# Patient Record
Sex: Female | Born: 1970
Health system: Southern US, Community
[De-identification: ages and names within clinical notes are randomized; demographics above are authoritative.]

## PROBLEM LIST (undated history)

## (undated) DIAGNOSIS — F419 Anxiety disorder, unspecified: Secondary | ICD-10-CM

## (undated) DIAGNOSIS — M069 Rheumatoid arthritis, unspecified: Secondary | ICD-10-CM

## (undated) DIAGNOSIS — G4730 Sleep apnea, unspecified: Secondary | ICD-10-CM

## (undated) DIAGNOSIS — J45909 Unspecified asthma, uncomplicated: Secondary | ICD-10-CM

## (undated) DIAGNOSIS — E039 Hypothyroidism, unspecified: Secondary | ICD-10-CM

## (undated) DIAGNOSIS — K219 Gastro-esophageal reflux disease without esophagitis: Secondary | ICD-10-CM

## (undated) HISTORY — PX: OTHER SURGICAL HISTORY: SHX169

## (undated) HISTORY — DX: Rheumatoid arthritis, unspecified: M06.9

## (undated) HISTORY — PX: ABDOMINAL HYSTERECTOMY: SHX81

---

## 2000-10-23 ENCOUNTER — Other Ambulatory Visit: Admission: RE | Admit: 2000-10-23 | Discharge: 2000-10-23 | Payer: Self-pay | Admitting: Obstetrics and Gynecology

## 2001-12-22 ENCOUNTER — Other Ambulatory Visit: Admission: RE | Admit: 2001-12-22 | Discharge: 2001-12-22 | Payer: Self-pay | Admitting: Obstetrics and Gynecology

## 2001-12-31 ENCOUNTER — Encounter (INDEPENDENT_AMBULATORY_CARE_PROVIDER_SITE_OTHER): Payer: Self-pay

## 2001-12-31 ENCOUNTER — Ambulatory Visit (HOSPITAL_COMMUNITY): Admission: RE | Admit: 2001-12-31 | Discharge: 2001-12-31 | Payer: Self-pay | Admitting: Obstetrics and Gynecology

## 2002-09-23 ENCOUNTER — Ambulatory Visit (HOSPITAL_COMMUNITY): Admission: RE | Admit: 2002-09-23 | Discharge: 2002-09-23 | Payer: Self-pay | Admitting: Gastroenterology

## 2003-08-11 ENCOUNTER — Other Ambulatory Visit: Admission: RE | Admit: 2003-08-11 | Discharge: 2003-08-11 | Payer: Self-pay | Admitting: Obstetrics and Gynecology

## 2003-11-17 ENCOUNTER — Encounter (INDEPENDENT_AMBULATORY_CARE_PROVIDER_SITE_OTHER): Payer: Self-pay | Admitting: *Deleted

## 2003-11-17 ENCOUNTER — Inpatient Hospital Stay (HOSPITAL_COMMUNITY): Admission: RE | Admit: 2003-11-17 | Discharge: 2003-11-19 | Payer: Self-pay | Admitting: Obstetrics and Gynecology

## 2003-11-20 ENCOUNTER — Inpatient Hospital Stay (HOSPITAL_COMMUNITY): Admission: AD | Admit: 2003-11-20 | Discharge: 2003-11-20 | Payer: Self-pay | Admitting: Obstetrics and Gynecology

## 2003-11-27 ENCOUNTER — Inpatient Hospital Stay (HOSPITAL_COMMUNITY): Admission: AD | Admit: 2003-11-27 | Discharge: 2003-11-27 | Payer: Self-pay | Admitting: Obstetrics and Gynecology

## 2004-04-11 ENCOUNTER — Ambulatory Visit: Admission: RE | Admit: 2004-04-11 | Discharge: 2004-04-11 | Payer: Self-pay | Admitting: Gynecology

## 2004-04-18 ENCOUNTER — Ambulatory Visit (HOSPITAL_COMMUNITY): Admission: RE | Admit: 2004-04-18 | Discharge: 2004-04-18 | Payer: Self-pay | Admitting: Obstetrics and Gynecology

## 2004-05-16 ENCOUNTER — Inpatient Hospital Stay (HOSPITAL_COMMUNITY): Admission: RE | Admit: 2004-05-16 | Discharge: 2004-05-18 | Payer: Self-pay | Admitting: Obstetrics and Gynecology

## 2004-05-16 ENCOUNTER — Encounter (INDEPENDENT_AMBULATORY_CARE_PROVIDER_SITE_OTHER): Payer: Self-pay | Admitting: *Deleted

## 2005-01-03 ENCOUNTER — Other Ambulatory Visit: Admission: RE | Admit: 2005-01-03 | Discharge: 2005-01-03 | Payer: Self-pay | Admitting: Obstetrics and Gynecology

## 2005-02-01 ENCOUNTER — Ambulatory Visit: Payer: Self-pay | Admitting: Cardiology

## 2008-08-02 ENCOUNTER — Encounter: Payer: Self-pay | Admitting: Gastroenterology

## 2008-08-06 ENCOUNTER — Ambulatory Visit: Payer: Self-pay | Admitting: Gastroenterology

## 2008-08-06 DIAGNOSIS — K602 Anal fissure, unspecified: Secondary | ICD-10-CM | POA: Insufficient documentation

## 2008-08-06 DIAGNOSIS — L909 Atrophic disorder of skin, unspecified: Secondary | ICD-10-CM | POA: Insufficient documentation

## 2008-08-06 DIAGNOSIS — L919 Hypertrophic disorder of the skin, unspecified: Secondary | ICD-10-CM

## 2008-08-06 DIAGNOSIS — E785 Hyperlipidemia, unspecified: Secondary | ICD-10-CM | POA: Insufficient documentation

## 2008-08-06 DIAGNOSIS — K625 Hemorrhage of anus and rectum: Secondary | ICD-10-CM | POA: Insufficient documentation

## 2008-08-09 ENCOUNTER — Ambulatory Visit: Payer: Self-pay | Admitting: Gastroenterology

## 2008-08-30 ENCOUNTER — Encounter: Payer: Self-pay | Admitting: Gastroenterology

## 2008-12-16 ENCOUNTER — Ambulatory Visit (HOSPITAL_COMMUNITY): Admission: RE | Admit: 2008-12-16 | Discharge: 2008-12-16 | Payer: Self-pay | Admitting: Obstetrics and Gynecology

## 2009-03-24 ENCOUNTER — Ambulatory Visit: Payer: Self-pay | Admitting: Diagnostic Radiology

## 2009-03-24 ENCOUNTER — Emergency Department (HOSPITAL_BASED_OUTPATIENT_CLINIC_OR_DEPARTMENT_OTHER): Admission: EM | Admit: 2009-03-24 | Discharge: 2009-03-24 | Payer: Self-pay | Admitting: Emergency Medicine

## 2010-10-15 HISTORY — PX: OTHER SURGICAL HISTORY: SHX169

## 2011-01-25 LAB — CBC
Hemoglobin: 14.6 g/dL (ref 12.0–15.0)
MCHC: 34.8 g/dL (ref 30.0–36.0)
MCV: 92.1 fL (ref 78.0–100.0)
RBC: 4.54 MIL/uL (ref 3.87–5.11)

## 2011-02-27 NOTE — H&P (Signed)
Jean Hernandez, Jean Hernandez                  ACCOUNT NO.:  [id number]   MEDICAL RECORD NO.:  [id number]          PATIENT TYPE:  AMB   LOCATION:  SDC                           FACILITY:  [facility]   PHYSICIAN:  Guy Sandifer. Henderson Cloud, M.D. DATE OF BIRTH:  1971-09-15   DATE OF ADMISSION:  DATE OF DISCHARGE:                              HISTORY & PHYSICAL   CHIEF COMPLAINT:  Leaking urine.   HISTORY OF PRESENT ILLNESS:  This patient is a 40 year old married white  female status post hysterectomy, status post subsequent, BSO and  exploratory laparotomy for ovarian remnant, complains of leaking urine  with coughing and sneezing.  Urodynamic studies consistent with stress  urinary incontinence.  After discussion of options, she is being  admitted for mid urethral sling.  Potential risks and complications have  been discussed preoperatively.   PAST MEDICAL HISTORY:  1. Depression.  2. Urinary tract infection.  3. Osteoporosis.  4. History of headache.  5. Esophageal reflux.   PAST SURGICAL HISTORY:  1. Cesarean hysterectomy.  2. Laparoscopy with lysis of adhesions and right salpingo-      oophorectomy.  3. Laparotomy with left salpingo-oophorectomy.  4. Laparotomy with extensive lysis of adhesions and removal of left      pelvic ovarian remnant.   OBSTETRICAL HISTORY:  Cesarean section x2.   MEDICATIONS:  Zoloft daily, Protonix daily, vitamin D daily.   ALLERGIES:  AMOXICILLIN leading to a bad rash.  ULTRACET, DARVOCET,  PERCOCET, and VICODIN all leading to mild itching.   SOCIAL HISTORY:  Denies tobacco, alcohol, or drug abuse.   FAMILY HISTORY:  Positive for migraines, heart disease, seizure  disorder, thyroid disease, diverticulosis, arthritis, diabetes,  hypertension, and cancer.   REVIEW OF SYSTEMS:  NEURO:  Headache as above.  CARDIAC:  Denied chest  pain.  PULMONARY:  Mild cough on and off.   PHYSICAL EXAMINATION:  VITAL SIGNS:  Height 5 feet 5-1/2 inches, weight  200.2 pounds, blood  pressure 120/82.  LUNGS:  Clear to auscultation.  HEART:  Regular rate and rhythm.  BACK:  No CVA tenderness.  ABDOMEN:  Soft, nontender without masses.  PELVIC:  Normal vagina without lesion.  Adnexa nontender without  palpable masses.  EXTREMITIES:  Grossly within normal limits.  NEUROLOGICAL:  Grossly within normal limits.   ASSESSMENT:  Stress urinary incontinence.   PLAN:  Mid urethral sling.      Guy Sandifer Henderson Cloud, M.D.  Electronically Signed     JET/MEDQ  D:  12/06/2008  T:  12/07/2008  Job:  [id number]

## 2011-02-27 NOTE — Op Note (Signed)
NAMEJOLA, CRITZER                  ACCOUNT NO.:  [id number]   MEDICAL RECORD NO.:  [id number]          PATIENT TYPE:  AMB   LOCATION:  SDC                           FACILITY:  [facility]   PHYSICIAN:  Guy Sandifer. Henderson Cloud, M.D. DATE OF BIRTH:  [birth year]   DATE OF PROCEDURE:  12/16/2008  DATE OF DISCHARGE:                               OPERATIVE REPORT   PREOPERATIVE DIAGNOSIS:  Stress urinary incontinence.   POSTOPERATIVE DIAGNOSIS:  Stress urinary incontinence.   PROCEDURE:  Solyx single incision mid-urethral sling.   SURGEON:  Guy Sandifer. Henderson Cloud, MD   ANESTHESIA:  General with LMA.   ESTIMATED BLOOD LOSS:  150 mL.   INDICATIONS AND CONSENT:  This patient is a 40 year old married white  female status post hysterectomy, status post BSO with symptomatic stress  incontinence.  Details are dictated in the history and physical.  Options were reviewed and mid-urethral sling was discussed  preoperatively.  Potential risks and complications were reviewed  preoperatively including but limited to infection, organ damage,  bleeding requiring transfusion of blood products with HIV and hepatitis  acquisition, DVT, PE, pneumonia.  Delayed healing, erosion, fistula  formation, pelvic pain, dyspareunia, recurrent stress incontinence,  prolonged catheterization, self-catheterization return to the OR,  irritative voiding symptoms and recurrent stress incontinence were  reviewed preoperatively.  All questions were answered and consent was  signed on the chart.   PROCEDURE:  The patient was taken to operating room where she was  identified, placed in dorsal supine position, and general anesthesia was  induced via LMA.  She was then placed in dorsal lithotomy position where  she was prepped and draped in a sterile fashion.  Foley catheter was  placed to the bladder as a drain.  The infraumbilical vaginal mucosa was  injected with 1% Xylocaine with 1:200,000 epinephrine.  A small  suburethral midline  incision was made.  Dissection was carried out  bilaterally to the level of the urogenital diaphragm.  The polypropylene  mesh, Solyx sling was then placed first on the right-hand side and then  the on the left-hand side.  Before releasing the applicator on the left-  hand side, a Tresa Endo was placed below the sling and can be rotated  perpendicular to the floor without excessive tension.  The sling was  laying flat with no kinks or rolls.  The applicator was then released  from the left side as well.  There was a bleeding noted at this point.  A 2-0 Monocryl suture was used with a single stitch on either side that  was placed on the backside of the vaginal mucosa about 1-cm lateral to  the midline incision bilaterally.  This does not incorporate the sling.  This controlled the bleeding quite well.  The midline incision was then  closed in a running locking fashion with the 2-0 Monocryl suture.  Foley  catheter was then removed and  cystoscopy was carried out with 70 degrees scope.  A 360-degree  inspection reveals no evidence of foreign bodies, no perforation, and  good puff of urine from the ureters bilaterally.  Foley catheter was  replaced, left in place to straight drain.  All counts were correct.  The patient was awakened and taken to recovery room in stable condition.       Guy Sandifer Henderson Cloud, M.D.  Electronically Signed     JET/MEDQ  D:  12/16/2008  T:  12/16/2008  Job:  [id number]

## 2011-03-02 NOTE — Op Note (Signed)
[facility]  Patient:    Jean Hernandez, Jean Hernandez Visit Number: [id number] MRN: [id number]          Service Type: DSU Location: [facility] Attending Physician:  Soledad Gerlach Dictated by:   Guy Sandifer [name], M.D. Proc. Date: 12/31/01 Admit Date:  12/31/2001                             Operative Report  PREOPERATIVE DIAGNOSIS:       Pelvic cyst.  POSTOPERATIVE DIAGNOSIS:      Right ovarian cyst and extensive pelvic                               adhesions.  OPERATION:                    Laparoscopy with right salpingo-oophorectomy and                               extensive lysis of adhesions.  SURGEON:                      Guy Sandifer. [name], M.D.  ANESTHESIA:                   General with endotracheal intubation.  ESTIMATED BLOOD LOSS:         100 cc.  INDICATIONS AND CONSENT:      This patient is a 40 year old married white female, status post cesarean hysterectomy with pelvic pain and pelvic cyst. Details are dictated in History and Physical.  Laparoscopy with removal of cyst, possible unilateral salpingo-oophorectomy, and possible laparotomy is discussed.  The potential risks and complications have been discussed including, but not limited to infection, bowel, bladder, or ureteral damage, bleeding requiring transfusion of blood products with possible transfusion reaction, HIV and hepatitis acquisition, DVT, PE, and pneumonia.  All questions have been answered and consent is signed on the chart.  FINDINGS:                     There is a dense row of omental adhesions down the midline to the level of the bladder.  The right ovary contains a 6-8 cm cyst which is adherent to the vaginal cuff as well as the sigmoid.  The left ovary is also essentially surrounded by adhesions.  DESCRIPTION OF PROCEDURE:     The patient is taken to the operating room and placed in the dorsal supine position where general anesthesia was induced via endotracheal  intubation.  She was then placed in the dorsolithotomy position where she was prepped abdominally and vaginally.  The bladder straight catheterized.  Sponge on the stick is placed and the vagina is manipulated, and she is draped in a sterile fashion.  A small infraumbilical incision was made and the 10-11 disposable trocar sleeve was placed.  Placement was verified with the laparoscope and the damage to the surrounding structures is noted.  Pneumoperitoneum is induced.  The adhesions of the omentum to the anterior abdominal wall are taken down stepwise with bipolar cautery and scissors.  This then exposes the large right ovarian cyst.  Then, in a very careful fashion using primarily sharp and some blunt dissection, the ovary is freed from its adhesions to the bowel.  While doing this, the  cyst drained a hemorrhagic fluid.  No excrescences were noted.  The infundibulopelvic ligament is totally free.  The course of the ureter could be seen well clear the area of surgery.  Then, using bipolar cautery, the infundibulopelvic ligament followed by the meso was taken down in a stepwise fashion.  This is carried out to the point of adherence to the vaginal cuff.  This is taken down sharply.  The left ovary is essentially abutting and adherent to a portion of the right ovary.  This was all taken down.  The ovary is finally released from its attachments.  Bipolar cautery was used to obtain complete hemostasis. Very careful attention was paid to the bowel which seems to be well-clear of the areas of surgery.  Copious irrigation is carried out.  It should be noted that after the omental adhesions were taken down, suprapubic, and _______ left lower quadrant incisions were made after careful transillumination and 5 mm non-disposable trocar sleeves were placed under direct visualization without difficulty.  After the ovary was freed, a 5 mm laparoscope was used in the left lower trocar sleeve and an  Endocatch was used to remove the ovary and fallopian tube through the umbilical incision without difficulty.  Then, turning back to the operative laparoscope, good hemostasis is noted all around.  The patient is __________ and excess fluid is removed.  Bleeders are cauterized on the omentum as well.  Inferior trocar sleeves are removed. Pneumoperitoneum is reduced and no bleeding is noted.  The umbilical trocar sleeve was removed.  A 0 Vicryl suture was used to close the underlying layers of the umbilical incision, although care was taken not to pick up any underlying structures.  The skin is then closed with 3-0 Vicryl all around. The incisions are injected with 0.5% plain Marcaine.  The sponge stick is removed from the vagina.  All counts are correct.  The patient is awakened and taken to the recovery room in stable condition. Dictated by:   Guy Sandifer [name], M.D. Attending Physician:  Soledad Gerlach DD:  12/31/01 TD:  01/01/02 Job: [id number]

## 2011-03-02 NOTE — Discharge Summary (Signed)
NAMESHANAI, LARTIGUE                            ACCOUNT NO.:  [id number]   MEDICAL RECORD NO.:  [id number]                   PATIENT TYPE:  INP   LOCATION:  9320                                 FACILITY:  [facility]   PHYSICIAN:  Guy Sandifer. [name], M.D.           DATE OF BIRTH:  Dec 12, 1970   DATE OF ADMISSION:  11/17/2003  DATE OF DISCHARGE:                                 DISCHARGE SUMMARY   ADMITTING DIAGNOSIS:  Pelvic pain.   DISCHARGE DIAGNOSIS:  Dense abdominopelvic adhesions.   PROCEDURE:  On November 17, 2003 laparotomy with left salpingo-oophorectomy  and extensive lysis of adhesions.   REASON FOR ADMISSION:  This patient is a 40 year old married white female G2  P2 status post cesarean hysterectomy, status post subsequent laparoscopy  with right salpingo-oophorectomy with recurrent pain.  She is being admitted  for surgical management.   HOSPITAL COURSE:  The patient is admitted to the hospital and undergoes the  above procedure.  Estimated blood loss is 600 mL.  On the evening of surgery  she has good pain control, stable vital signs, is afebrile with clear urine  output.  On postoperative day #1 she has had some nausea which is relieved  with medication.  She has been up on her feet ambulating without difficulty.  Blood pressures are noted to be in the 90-100 systolic range.  Pulse is  regular in the 70s and 80s.  Urine output is excellent, color is good.  Abdomen is flat and soft.  She is afebrile.  Hemoglobin is 10.2 and white  count is 7.8.  Her IV is continued and she is observed closely.  At one  point she has a blood pressure on the machine of 60 diastolic.  This is  repeated manually and is 80/50.  A repeat CBC is done and her hemoglobin is  stable at 9.9.  EKG is benign.  Her maximum temperature that day is 100.6.  Her urine output continues to be excellent.  Her pulse is in the 60s and 70s  and regular.  She feels good.  She is ambulating well without difficulty,  passing flatus, and tolerating regular diet.  Abdomen remained flat and  soft.  Consultation by Dr. Laqueta Due, anesthesiology, is also obtained.  He concurs with the plan.  The Accufusor local anesthetic is discontinued.  The morning of discharge she again is ambulating well without dizziness and  feeling fine.  Blood pressures are stable in the 88 to 100 range over 50s  and 50s with a regular pulse in the 70s.  She remains afebrile.  Abdomen is  flat and soft and she feels fine.   CONDITION ON DISCHARGE:  Good.   DIET:  Regular as tolerated..   ACTIVITY:  No lifting, no operation of automobiles, no vaginal entry.  She  is given careful instructions to drink a lot of fluid, to not attempt to  climb any stairs by herself.  Her blood pressure will be taken daily by her  neighbor who is a Surveyor, mining and she will let us know what that  is.  She is to call the office for other problems including but not limited  to a temperature of 101, persistent nausea/vomiting, or increasing pain.   MEDICATIONS:  1. Mepergan Fortis #20 one p.o. q.6h. p.r.n.  2. Ibuprofen 600 mg p.o. q.6h. p.r.n.   FOLLOW-UP:  Follow-up is in the office in 1-2 weeks.                                               Guy Sandifer [name], M.D.    JET/MEDQ  D:  11/19/2003  T:  11/19/2003  Job:  [id number]

## 2011-03-02 NOTE — H&P (Signed)
Jean Hernandez, Jean Hernandez                            ACCOUNT NO.:  [id number]   MEDICAL RECORD NO.:  [id number]                   PATIENT TYPE:  AMB   LOCATION:  SDC                                  [facility]ILITY:  [facility]   PHYSICIAN:  Guy Sandifer. [name], M.D.           DATE OF BIRTH:  1971/05/05   DATE OF ADMISSION:  11/17/2003  DATE OF DISCHARGE:                                HISTORY & PHYSICAL   CHIEF COMPLAINT:  Pelvic pain.   HISTORY OF PRESENT ILLNESS:  This patient is a 40 year old married white  female G2 P2 status post cesarean hysterectomy in 2000 and status post  subsequent laparoscopic extensive lysis of adhesions and right salpingo-  oophorectomy in 2003.  She has had the recurrence of left lower quadrant  pain which has become persistent and severe off and on.  Ultrasound in my  office on November 09, 2003 reveals 1.5 and 1.4 cm follicular cysts on the  ovary.  It was noted to be surrounded by adhesions at the time of her  laparoscopy in 2003.  Due to the level of pain the patient has she desires  intervention.  After discussion of the options she is being admitted for  laparoscopy, possible left salpingo-oophorectomy, possible laparotomy.  The  risks of surgery have been discussed preoperatively as well as menopause.   PAST MEDICAL HISTORY:  History of back pain.   PAST SURGICAL HISTORY:  1. Cesarean hysterectomy as above.  2. Laparoscopy with right salpingo-oophorectomy as above.   OBSTETRIC HISTORY:  Cesarean section x2.   MEDICATION:  Mepergan Fortes p.r.n.   ALLERGIES:  1. AMOXICILLIN leading to rash (Keflex is okay).  2. VICODIN leading to itching.  3. DARVOCET leading to itching.  4. ULTRACET leading to itching.   FAMILY HISTORY:  Hemophilia maternal cousin, multiple gestations, diabetes  in father and maternal grandfather, stomach cancer paternal uncle, chronic  hypertension mother and maternal uncle, heart disease maternal uncle and  both maternal grandparents,  epilepsy in paternal uncle, Parkinson's disease  paternal grandfather, thyroid abnormalities in maternal aunt, father had  polio.   REVIEW OF SYSTEMS:  NEUROLOGIC:  Denies headache.  PULMONARY:  Denies  shortness of breath.  CARDIOVASCULAR:  Denies chest pain.  GI:  Denies  recent changes in bowel habits.   PHYSICAL EXAMINATION:  VITAL SIGNS:  Height 5 feet 5 inches, weight 138-1/2  pounds, blood pressure 102/60.  HEENT:  Without thyromegaly.  LUNGS:  Clear to auscultation.  HEART:  Regular rate and rhythm.  BACK:  Without CVA tenderness.  BREASTS:  Without mass, retraction, discharge.  ABDOMEN:  Soft, nontender, without masses.  PELVIC:  Vulva, vagina, and cervix are without lesion.  Right adnexa  nontender without masses, left adnexa is tender.  EXTREMITIES/NEUROLOGICAL:  Grossly within normal limits.   ASSESSMENT:  Left lower quadrant pain.   PLAN:  Laparoscopy, possible laparotomy, possible left salpingo-  oophorectomy.  Guy Sandifer [name], M.D.    JET/MEDQ  D:  11/16/2003  T:  11/16/2003  Job:  [id number]

## 2011-03-02 NOTE — Op Note (Signed)
Jean Hernandez, Jean Hernandez                            ACCOUNT NO.:  [id number]   MEDICAL RECORD NO.:  [id number]                   PATIENT TYPE:  AMB   LOCATION:  SDC                                  [facility]AC[facility]   PHYSICIAN:  Guy Sandifer. [name], M.D.           DATE OF BIRTH:  1971/03/28   DATE OF PROCEDURE:  11/17/2003  DATE OF DISCHARGE:                                 OPERATIVE REPORT   PREOPERATIVE DIAGNOSIS:  Pelvic pain.   POSTOPERATIVE DIAGNOSIS:  Dense abdominal-pelvic adhesions.   PROCEDURE:  Laparotomy with left salpingo-oophorectomy and extensive lysis  of adhesions.   SURGEON:  Guy Sandifer. Henderson Cloud, M.D.   ANESTHESIA:  1. General with endotracheal intubation.  2. AcuFusion subcu and subfascial pump.   ESTIMATED BLOOD LOSS:  600 mL.   SPECIMENS:  Left tube and ovary.   INDICATIONS AND CONSENT:  This patient is a 40 year old married white  female, G2, P2, status post cesarean, hysterectomy, and status post  subsequent laparoscopy with extensive lysis of adhesions and right salpingo-  oophorectomy.  She has had recurrent pain.  Details are dictated in the  history and physical.  Laparoscopy and possible laparotomy, lysis of  adhesions, and possible left salpingo-oophorectomy is discussed with the  patient preoperatively.  Potential risks and benefits have been discussed,  including but not limited to infection, bowel, bladder, or ureteral damage,  bleeding requiring transfusion of blood products and possible transfusion  reaction, HIV and hepatitis acquisition, DVT, PE, pneumonia, menopausal  symptoms, and ovarian remnant syndrome.  All questions have been answered  and consent is signed on the chart.   FINDINGS:  At open laparoscopy there was a row of omental adhesions down the  midline, ending above the level of the bladder.  The ovary is very densely  adherent to the pelvic sidewall at a level below the cervical stump and is  also densely adherent to overlying sigmoid  colon and omentum.   DESCRIPTION OF PROCEDURE:  The patient is taken to the operating room and  placed in the dorsal supine position, where general anesthesia is induced  via endotracheal intubation.  She is then placed in the dorsal lithotomy  position, where she is prepped abdominally and vaginally, the bladder is  straight-catheterized, a ring clamp with sponge is placed in the vagina, and  she is draped in a sterile fashion.  A small infraumbilical incision is made  and dissection is carried out in layers to the peritoneal cavity.  Anchoring  sutures of 0 Vicryl are placed at each angle of the fascia and the  disposable open laparoscopic trocar sleeve is placed and tied down.  Inspection reveals proper placement and no damage to surrounding structures.  Pneumoperitoneum is induced and the above findings are noted.  The Harmonic  scalpel is then used to take down the omental adhesions at their point of  insertion in the anterior abdominal wall.  A suprapubic incision is then  made in the midline and the 5 mm nondisposable trocar sleeve is placed under  direct visualization without difficulty.  The above findings are noted.  It  is felt that the adhesions are too dense to safely proceed laparoscopically.  Therefore, the suprapubic trocar sleeve is withdrawn as well as the  umbilical trocar sleeve.  The umbilical incision is closed by tying the  anchoring sutures, which closes the fascia.  Vicryl 2-0 suture is used  subcutaneously and the umbilical incision is injected with 0.5% plain  Marcaine.  Dermabond is applied to the umbilicus.  A Pfannenstiel incision  is then made through the previous scar and dissection is carried out in  layers to the peritoneum, which is incised and extended superiorly and  inferiorly.  Some filmy adhesions of the omentum to the pelvis overlying the  ovary are taken down sharply.  The O'Connor-O'Sullivan retractor is then  placed.  The retractor is elevated  with towels bilaterally.  The bowel is  packed away.  The sigmoid is adherent to the pelvis overlying the ovary.  This is taken down sharply.  Finally the ovary is identified down deep into  the cul-de-sac to the left of the midline under the sigmoid.  Then through a  combination of blunt and sharp dissection, the ovary is uncovered.  The left  retroperitoneal space is entered superior to the level of the round  ligament.  However, the space is also densely fibrosed and is not amenable  to dissection.  Therefore, the ovary that could be carefully identified and  elevated above the level of the left pelvic sidewall is crossclamped with a  Heaney clamp and resected and sent to pathology.  This pedicle is ligated  with a suture and a free tie of 0 Monocryl.  Multiple bleeders are  controlled with superficial 2-0 chromic sutures.  Copious irrigation is  carried out.  Great care is taken not to enter the retroperitoneal space via  the pelvic sidewall with sutures or dissection.  Copious irrigation is  carried, out and all returns as clear.  Two small portions of Gelfoam are  placed in the pelvic sidewall to assure complete hemostasis.  Instruments  are removed, packs are removed, counts are correct.  The anterior peritoneum  is closed in running fashion with 2-0 Monocryl suture, which is also used to  reapproximate the pyramidalis muscle in the midline.  The introducer for the  AcuFuser is then placed in the subfascial space, exiting superior and to the  left of midline of the incision.  This is placed and then the fascia is  closed in a running fashion with 0 Monocryl suture.  Then to the right  superior margin of the incision the second introducer is placed in the  subcutaneous space and the catheter is left there.  Skin is closed with  clips.  The AcuFuser line is then taped down appropriately and covered with  dressings.  The ring clamp in the vagina is removed.  All counts are correct.  The  patient is awakened and taken to the recovery room in stable  condition.                                               Guy Sandifer [name], M.D.    JET/MEDQ  D:  11/17/2003  T:  11/17/2003  Job:  [id number]

## 2011-03-02 NOTE — Consult Note (Signed)
[name], [name]                            ACCOUNT NO.:  [id number]   MEDICAL RECORD NO.:  [id number]                   PATIENT TYPE:  OUT   LOCATION:  GYN                                  FACILITY:  [facility]   PHYSICIAN:  De Blanch, M.D.         DATE OF BIRTH:  06-Apr-1971   DATE OF CONSULTATION:  04/11/2004  DATE OF DISCHARGE:                                   CONSULTATION   A 40 year old white female seen in consultation at the request of Dr. Thressa Hernandez regarding apparent ovarian remnant syndrome.   The patient has a complex gynecologic history including two cesarean  sections, one of which resulted in a supracervical hysterectomy to control  bleeding.  Subsequently, in 2003, she underwent a laparoscopic right  salpingo-oophorectomy.  In February 2005, she had a left ovarian cyst,  underwent laparoscopy followed by laparotomy with attempt to remove the  ovary.  Dense adhesions were found.  Subsequently, the patient has had a  persistent cyst in the left adnexa which is painful.  This has been  identified on ultrasound in April and subsequently, an ultrasound in June  showed increase in size of the cyst which is apparently bilobed, measuring 4  x 4 x 3.7 x 4.6 cm and another cystic structure measuring 2.6 x 2.2 x 1.4  cm.  The patient does not have hot flushes, is not on hormone replacement  therapy, and apparently has normal FSH and estradiol.  Currently, she is  having a considerable amount of left lower quadrant pain.   PAST MEDICAL HISTORY:   MEDICAL ILLNESSES:  None.   DRUG ALLERGIES:  PENICILLIN, VICODIN, DARVOCET, ULTRACET, PERCOCET all cause  itching.  She does tolerate Naprosyn for pain control.   FAMILY HISTORY:  Negative for gynecologic, breast, or colon cancer.   SOCIAL HISTORY:  The patient is married.  She does not smoke.  She is a  Production designer, theatre/television/film at [employer].   REVIEW OF SYSTEMS:  Negative except as noted above.   PHYSICAL EXAMINATION:  VITAL SIGNS:   Height 5 feet 6 inches,  weight 146  pounds, blood pressure 120/78, pulse 68, respiratory rate 16.  GENERAL:  The patient is a healthy pleasant young woman in no acute  distress.  HEENT:  Negative.  NECK:  Supple without thyromegaly.  There is no supraclavicular or inguinal  adenopathy.  ABDOMEN:  Soft, nontender.  No masses, organomegaly, ascites, or hernias  noted.  Her transverse scar is somewhat retracted.  PELVIC:  EG//BUS, vagina, bladder, urethra are normal.  Cervix is normal.  On bimanual examination, there is a cystic fullness to the left and  posterior to the cervix.  Rectovaginal exam confirms this is tender.   IMPRESSION:  Residual ovary with cystic mass causing pain.   I had a lengthy discussion with the patient regarding surgical strategy to  resect this.  I emphasized that there are increased risks given her  adhesions and the location of the cyst.  These risks include injury to the  bladder, ureter, blood vessels, colon, and the small bowel.  She is aware  that the removal of the cervix may be necessary in order to adequately get  around this mass, and she is fine with that issue.  We will also attempt to  revise her scar.   The additional risks of surgery including hemorrhage, infection, injury to  adjacent viscera, thromboembolic complications, anesthetic risks were  outlined.  We will coordinate surgery in conjunction with Dr. Henderson Hernandez.   I would like to obtain a preoperative CT scan to assess the ureter and  adjacent structures.  Five days prior to surgery, I would like to start the  patient on Clomid 50 mg daily in order to enhance the size of the cyst.                                               De Blanch, M.D.    DC/MEDQ  D:  04/11/2004  T:  04/11/2004  Job:  [id number]   cc:   Jean Hernandez. [name], M.D.  [address]  [place]  [place] [address]  Fax: [phone]   Telford Nab, R.N.  [phone] [address]  [place], [place] [address]

## 2011-03-02 NOTE — Discharge Summary (Signed)
[name], Jean Hernandez                            ACCOUNT NO.:  [id number]   MEDICAL RECORD NO.:  [id number]                   PATIENT TYPE:  INP   LOCATION:  0476                                 FACILITY:  [facility]   PHYSICIAN:  Guy Sandifer. [name], M.D.           DATE OF BIRTH:  March 23, 1971   DATE OF ADMISSION:  05/16/2004  DATE OF DISCHARGE:  05/18/2004                                 DISCHARGE SUMMARY   ADMITTING DIAGNOSIS:  Left adnexal cyst.   DISCHARGE DIAGNOSIS:  Left adnexal cyst.   PROCEDURES:  On May 16, 2004, left salpingo oophorectomy, ureteral lysis,  trachelectomy, and proctoscopy.   REASON FOR ADMISSION:  This patient is a 40 year old married white female,  G2 P2, status post caesarian hysterectomy, subsequent RSO and then  laparotomy for LSO.  She has a recurrent left adnexal cyst consistent with a  probable ovarian remnant.  She is admitted for surgical management.   HOSPITAL COURSE:  The patient was admitted to the hospital, underwent the  above procedure with an estimated blood loss of 350 mL.  On the evening of  surgery she had stable vital signs and good pain relief.  On the first  postoperative day, she was ambulating, with stable vital signs.  Hemoglobin  was 11.1, white count 9.4.  Pathology is pending.  On the day of discharge,  she was ambulating, passing flatus, tolerating regular diet and wants to go  home.  Vital signs remain stable.  She is afebrile.  The incision is healing  well.   CONDITION ON DISCHARGE:  Good.  Diet regular as tolerated.  Activity:  No  lifting, no operation of automobiles, no vaginal entry.  She is to call the  office for problems including but not limited to temperature of 101 degrees,  increasing pain, persistent nausea and vomiting.   DISCHARGE MEDICATIONS:  1. Dilaudid 4 mg, #30, 1 p.o. q.4-6 hours p.r.n. pain.  2. Ibuprofen 600 mg q.6 hours p.r.n.  3. Colace daily.  4. Multivitamin daily.   FOLLOW UP:  Follow up in the  office in 4-5 days for staple removal and  evaluation.                                               Guy Sandifer [name], M.D.    JET/MEDQ  D:  05/18/2004  T:  05/19/2004  Job:  [id number]   cc:   De Blanch, M.D.

## 2011-03-02 NOTE — Op Note (Signed)
   NAMEKRISTIA, Jean Hernandez                              ACCOUNT NO.:  [id number]   MEDICAL RECORD NO.:  [id number]                   PATIENT TYPE:  AMB   LOCATION:  ENDO                                 FACILITY:  [facility]   PHYSICIAN:  Anselmo Rod, M.D.               DATE OF BIRTH:  1971-07-07   DATE OF PROCEDURE:  09/23/2002  DATE OF DISCHARGE:  09/23/2002                                 OPERATIVE REPORT   PROCEDURE PERFORMED:  Colonoscopy.   ENDOSCOPIST:  Charna Elizabeth, M.D.   INSTRUMENT USED:  Olympus pediatric adjustable colonoscope.   INDICATIONS FOR PROCEDURE:  The patient is a 40 year old white female with a  history of change in bowel habits and rectal bleeding.  Rule out colonic  inflammatory bowel disease.   PREPROCEDURE PREPARATION:  Informed consent was procured from the patient.  The patient was fasted for eight hours prior to the procedure and prepped  with a bottle of magnesium citrate and a gallon of NuLytely the night prior  to the procedure.   PREPROCEDURE PHYSICAL:  The patient had stable vital signs.  Neck supple.  Chest clear to auscultation.  S1 and S2 regular.  Abdomen soft with normal  bowel sounds.   DESCRIPTION OF PROCEDURE:  The patient was placed in left lateral decubitus  position and sedated with 70 mg of Demerol and 7 mg of Versed intravenously.  Once the patient was adequately sedated and maintained on low flow oxygen  and continuous cardiac monitoring, the Olympus video colonoscope was  advanced from the rectum to the cecum and terminal ileum without difficulty.  The entire colonic mucosa at the terminal ileum appeared normal and without  lesions.  Small internal hemorrhoids were seen on retroflexion.  No  erosions, ulcerations, masses or polyps were noted.  The patient tolerated  the procedure well without complication.   IMPRESSION:  Normal colonoscopy up to the terminal ileum.  Small internal  hemorrhoids seen.    RECOMMENDATIONS:  1. A high  fiber diet has been discussed with the patient in great detail and     brochures have been given to her for her education.  2. Outpatient follow-up is advised in the next two weeks or earlier if need     be for further recommendation.                                                   Anselmo Rod, M.D.    JNM/MEDQ  D:  09/25/2002  T:  09/25/2002  Job:  [id number]   cc:   Marcial Pacas P. Audie Box, M.D.  [address], [address]  [place]  [place] [address]  Fax: [phone]

## 2011-03-02 NOTE — Op Note (Signed)
Jean Hernandez, Jean Hernandez                            ACCOUNT NO.:  [id number]   MEDICAL RECORD NO.:  [id number]                   PATIENT TYPE:  INP   LOCATION:  0476                                 FACILITY:  [facility]   PHYSICIAN:  De Blanch, M.D.         DATE OF BIRTH:  12/10/1970   DATE OF PROCEDURE:  05/16/2004  DATE OF DISCHARGE:                                 OPERATIVE REPORT   PREOPERATIVE DIAGNOSIS:  Residual ovary with pelvic pain and ovarian cyst.   POSTOPERATIVE DIAGNOSIS:  Left ovarian cyst with endometrioma,  retroperitoneal fibrosis, severe pelvic adhesions.   PROCEDURE:  1. Exploratory laparotomy  2. Retroperitoneal exploration with ureterolysis.  3. Left salpingo-oophorectomy.  4. Trachelectomy.  5. Proctoscopy.   SURGEON:  Daniel L. Clarke-Pearson, M.D.   ASSISTANT:  1. Harold Hedge, M.D.  2. Telford Nab, R.N.   ANESTHESIA:  General with orotracheal tube.   ESTIMATED BLOOD LOSS:  350 mL.   SURGICAL FINDINGS:  At the time of exploratory laparotomy the omentum was  adherent to the anterior abdominal wall.  Exploration of the upper abdomen  was normal.  In the pelvis the sigmoid colon was densely adherent to the  entire left pelvic sidewall and bladder.  Beneath this was a residual ovary  with cystic mass approximately 7 cm in diameter.  It was densely adherent to  the ureter, hypogastric artery, bladder and residual cervix as well as the  mesentery of the sigmoid colon.  There was retroperitoneal fibrosis.  Proctoscopy and a bubble test was performed showing no evidence of leak from  the colon at the end of the procedure.  Likewise, the bladder was filled  with saline and no leak was noted either.  Frozen section returned showing  this to be a hemorrhagic corpus luteum and an endometrioma.   PROCEDURE:  The patient was brought to the operating room and after  satisfactory attainment of general anesthesia was placed in the modified  lithotomy  position and Allen stirrups.  The anterior abdominal wall,  perineum and vagina were prepped with Betadine, a Foley catheter was placed  and the patient was draped.  The prior Pfannenstiel incision, which was  retracted, was excised, thus performing a small abdominoplasty,  Subcutaneous tissue was dissected cephalad off the fascia to get mobility in  order to achieve tension-free closure at the end of the case.  Adhesions of  the omentum were lysed away from the anterior abdominal wall using cautery.  Pelvic washings were obtained and held until the pathology report returned  as benign then were discarded.  The upper abdomen and pelvis were explored  with the above-noted findings.  A Bookwalter retractor was positioned and  the small  bowel packed out of the pelvis.  Attention was turned to the left  pelvic sidewall.  The sigmoid colon was mobilized from its adhesions to the  sidewall and the bladder using sharp and blunt dissection,  hemostasis  achieved with cautery.  After the pelvic sidewall was exposed, the  peritoneum was incised and the retroperitoneal structures identified  including the external iliac artery and vein, ureter and ovarian vessels.  The pararectal and paravesical spaces were opened, thus identifying the  superior vesical artery as well.  The ovarian vessels were skeletonized away  from their attachment to the sigmoid mesentery, these were then clamped,  cut, free tied and suture ligated.  The round ligament was divided, the  medial aspect held for traction.  The cyst was identified which was densely  adherent to the pelvic sidewall and sigmoid mesentery.  Using sharp and  blunt dissection and cautery for hemostasis, the cyst was mobilized away  from the sigmoid mesentery with care taken to avoid injury to mesenteric  vessels.  Once the mesentery was mobilized, it was clear that  retroperitoneal fibrosis was making the cyst adherent to the ureter.  Ureterolysis was  performed from the pelvic brim to the insertion of the  ureter into the bladder.  In the course of this dissection the uterine  vessels were divided and clipped lateral to the ureter.  With the ureter  fully mobilized, it was pulled laterally.  Further dense retroperitoneal  fibrosis was encountered along the hypogastric artery.  In order to further  mobilize the cyst away, sharp dissection was performed along the hypogastric  artery with care taken to avoid vascular injury.  This was dissected clear  down to the superior vesical artery.  The cyst was then mobilized further  medially.  The attachments to the cyst in the pararectal space were then  divided with cautery.  The bladder was then advanced off of the cervical  stump.  It was then apparent that the cervix was densely adherent to the  cyst as well.  The posterior aspect of the vagina was opened and the cyst  further mobilized until all that was connected was the cyst to the cervical  stump.  At this juncture it was decided to proceed with trachelectomy in  order to be certain to completely remove the residual ovary.  The cervical  branches of the uterine artery were then clamped, cut and suture ligated.  The vagina was then transected circumferentially, with care taken to avoid  injury to the bladder, which had been further mobilized off the upper  vagina.  The vaginal angles were then suture ligated with 2-0 Vicryl and the  central portion of the vagina closed with interrupted figure-of-eight  sutures of 0 Vicryl.  To be certain that there was no bladder injury, the  bladder was then filled retrograde with saline.  The bladder was distended  and no leak was noted and no thin areas were noted.   In order to be certain that there was no rectal injury or sigmoid injury,  the proctoscopy was performed and a bubble test performed under saline.  There was no apparent leak from the colon and no thin areas.  The pelvis was then irrigated  with warm saline.  Hemostasis was achieved  with some additional clips and cautery.  The appendix was inspected and  found to be normal.  The right pelvic sidewall likewise appeared normal, the  prior tube and ovary having been resected.   The packs and retractors were removed, the anterior abdominal wall closed in  layers.  The first being a running suture of 2-0 Vicryl on the midline  peritoneal incision.  Subcutaneous tissue, rectus muscle and fascia were  inspected.  Hemostasis was achieved with cautery.  The fascia was then  closed with a running suture of 0 PDS.  The subcutaneous tissue was again  irrigated, hemostasis achieved with cautery and the lower aspect of the  incision subcutaneous tissue mobilized off the fascia.  With good mobility  the skin was closed with skin staples, achieving a good cosmetic result.   The patient was awakened from anesthesia after dressings applied.  Sponge,  needle and instrument counts correct x2.                                               De Blanch, M.D.    DC/MEDQ  D:  05/16/2004  T:  05/16/2004  Job:  [id number]

## 2011-03-02 NOTE — H&P (Signed)
Jean Hernandez, Jean Hernandez                            ACCOUNT NO.:  [id number]   MEDICAL RECORD NO.:  [id number]                   PATIENT TYPE:  INP   LOCATION:  NA                                   FACILITY:  [facility]   PHYSICIAN:  Guy Sandifer. [name], M.D.           DATE OF BIRTH:  1971/07/01   DATE OF ADMISSION:  05/16/2004  DATE OF DISCHARGE:                                HISTORY & PHYSICAL   CHIEF COMPLAINT:  Pelvic pain.   HISTORY OF PRESENT ILLNESS:  This patient is a 40 year old white female G2,  P2 status post cesarean hysterectomy in 2000.  She subsequently underwent  laparoscopic extensive lysis of adhesions and RSO in 2003.  She then had a  recurrence of pelvic pain with a left ovarian cyst.  In February 2005 she  underwent laparoscopy and laparotomy for left salpingo-oophorectomy.  Since  that time she has had recurrent left lower quadrant pain.  She has had a  persistent bilobed cystic structure in the left adnexa which on May 05, 2004 measured 3.7 and 1.9 and 1.6 cm, respectively, on pelvic ultrasound.  Abdominopelvic CAT scan on April 18, 2004 is consistent with no acute finding  in the abdomen and a 7 cm left adnexal cyst.  Finally she has had a normal  FSH and estradiol level.  Consultation with Dr. Stanford Breed has been  undertaken.  Patient is being admitted for exploratory laparotomy/excision  of left adnexal cyst.   PAST MEDICAL HISTORY:  Back pain.   PAST SURGICAL HISTORY:  1. Cesarean hysterectomy as above.  2. Laparoscopy with lysis of adhesions and RSO.  3. Laparotomy with LSO as above.   OBSTETRICAL HISTORY:  Cesarean section x2.   MEDICATION:  Percocet p.r.n.   ALLERGIES:  PENICILLIN, VICODIN, DARVOCET, ULTRACET, and PERCOCET have  caused itching in the past; she does not tolerate NAPROSYN for pain control;  AMOXICILLIN causes rash (Keflex is okay).   FAMILY HISTORY:  Hemophilia maternal cousin.  Multiple gestations.  Diabetes  in father and maternal  grandfather.  Stomach cancer paternal uncle.  Chronic  hypertension mother and maternal uncle.  Heart disease maternal uncle and  both maternal grandparents.  Epilepsy paternal uncle.  Parkinson's disease  paternal grandfather.  Thyroid abnormalities maternal aunt.  Father had  polio.  Negative for gynecologic, breast, and colon cancer.   REVIEW OF SYSTEMS:  NEUROLOGY:  Denies headache.  CARDIOLOGY:  Denies chest  pain.  PULMONARY:  Denies cough or shortness of breath.  GI:  Denies recent  changes in bowel habits.  PELVIC/ABDOMEN:  Pain as above.   SOCIAL HISTORY:  Patient denies tobacco, alcohol, or drug abuse.   PHYSICAL EXAMINATION:  VITAL SIGNS:  Height 5 feet 5 inches, blood pressure  120/70.  HEENT:  Without thyromegaly.  LUNGS:  Clear to auscultation.  HEART:  Regular rate and rhythm.  BACK:  Without CVA tenderness.  BREASTS:  Without mass, retraction, discharge.  ABDOMEN:  Flat, soft with mild tenderness in left lower quadrant without  mass or rebound.  PELVIC:  Vulva and vagina without lesion, a 4-6 cm cystic structure very  tender in the left adnexa, right adnexa nontender without masses.  EXTREMITIES/NEUROLOGICAL:  Grossly within normal limits.   ASSESSMENT:  Left adnexal cyst and history of dense adhesions.   PLAN:  Exploratory laparotomy/removal of left adnexal cyst with Dr. De Blanch.                                               Guy Sandifer [name], M.D.    JET/MEDQ  D:  05/15/2004  T:  05/15/2004  Job:  [id number]

## 2013-09-17 ENCOUNTER — Other Ambulatory Visit (HOSPITAL_COMMUNITY): Payer: Self-pay | Admitting: Gastroenterology

## 2013-09-17 DIAGNOSIS — R1110 Vomiting, unspecified: Secondary | ICD-10-CM

## 2013-09-30 ENCOUNTER — Encounter (HOSPITAL_COMMUNITY)
Admission: RE | Admit: 2013-09-30 | Discharge: 2013-09-30 | Disposition: A | Discharge status: Home / Self Care | Payer: Federal, State, Local not specified - PPO | Source: Ambulatory Visit | Attending: Gastroenterology | Admitting: Gastroenterology

## 2013-09-30 DIAGNOSIS — K219 Gastro-esophageal reflux disease without esophagitis: Secondary | ICD-10-CM | POA: Insufficient documentation

## 2013-09-30 DIAGNOSIS — R112 Nausea with vomiting, unspecified: Secondary | ICD-10-CM | POA: Insufficient documentation

## 2013-09-30 DIAGNOSIS — R1110 Vomiting, unspecified: Secondary | ICD-10-CM

## 2013-09-30 MED ORDER — TECHNETIUM TC 99M SULFUR COLLOID
2.0000 | Freq: Once | INTRAVENOUS | Status: AC | PRN
  Administered 2013-09-30: 2 via INTRAVENOUS

## 2014-04-21 ENCOUNTER — Ambulatory Visit: Payer: Federal, State, Local not specified - PPO | Admitting: Physical Therapy

## 2014-04-28 ENCOUNTER — Ambulatory Visit: Payer: Federal, State, Local not specified - PPO | Attending: Rheumatology | Admitting: Physical Therapy

## 2014-04-28 DIAGNOSIS — M069 Rheumatoid arthritis, unspecified: Secondary | ICD-10-CM | POA: Insufficient documentation

## 2014-04-28 DIAGNOSIS — M25569 Pain in unspecified knee: Secondary | ICD-10-CM | POA: Insufficient documentation

## 2014-04-28 DIAGNOSIS — M1990 Unspecified osteoarthritis, unspecified site: Secondary | ICD-10-CM | POA: Diagnosis not present

## 2014-04-28 DIAGNOSIS — R5381 Other malaise: Secondary | ICD-10-CM | POA: Insufficient documentation

## 2014-04-28 DIAGNOSIS — IMO0001 Reserved for inherently not codable concepts without codable children: Secondary | ICD-10-CM | POA: Insufficient documentation

## 2014-05-04 ENCOUNTER — Ambulatory Visit: Payer: Federal, State, Local not specified - PPO | Admitting: Physical Therapy

## 2014-06-22 ENCOUNTER — Encounter: Payer: Self-pay | Admitting: Gastroenterology

## 2014-08-18 ENCOUNTER — Other Ambulatory Visit: Payer: Self-pay | Admitting: Obstetrics and Gynecology

## 2014-08-19 LAB — CYTOLOGY - PAP

## 2014-11-23 ENCOUNTER — Encounter: Payer: Self-pay | Admitting: Neurology

## 2014-11-23 ENCOUNTER — Ambulatory Visit (INDEPENDENT_AMBULATORY_CARE_PROVIDER_SITE_OTHER): Payer: Federal, State, Local not specified - PPO | Admitting: Neurology

## 2014-11-23 VITALS — BP 119/76 | HR 102 | Temp 98.9°F | Resp 17 | Ht 66.0 in | Wt 209.0 lb

## 2014-11-23 DIAGNOSIS — G4720 Circadian rhythm sleep disorder, unspecified type: Secondary | ICD-10-CM

## 2014-11-23 DIAGNOSIS — R400 Somnolence: Secondary | ICD-10-CM

## 2014-11-23 DIAGNOSIS — R0683 Snoring: Secondary | ICD-10-CM

## 2014-11-23 DIAGNOSIS — R351 Nocturia: Secondary | ICD-10-CM

## 2014-11-23 DIAGNOSIS — G479 Sleep disorder, unspecified: Secondary | ICD-10-CM

## 2014-11-23 DIAGNOSIS — E669 Obesity, unspecified: Secondary | ICD-10-CM

## 2014-11-23 DIAGNOSIS — G478 Other sleep disorders: Secondary | ICD-10-CM

## 2014-11-23 DIAGNOSIS — F40240 Claustrophobia: Secondary | ICD-10-CM

## 2014-11-23 DIAGNOSIS — G4710 Hypersomnia, unspecified: Secondary | ICD-10-CM

## 2014-11-23 NOTE — Progress Notes (Signed)
Subjective:    Patient ID: Jean Hernandez is a 44 y.o. female.  HPI     Huston Foley, MD, PhD [facility] [address], [address] [address] [place], [place] [address]  Dear Jean Hernandez,    I saw your patient, [name], upon your kind request in my neurologic clinic today for initial consultation of her sleep disorder, in particular, concern for underlying obstructive sleep apnea. The patient is unaccompanied today. As you know, [name] is a 44 year old right-handed woman with an underlying medical history of sinusitis, cellulitis, rheumatoid arthritis, reflux disease, allergies, shingles in 2015 and obesity, who reports inability to maintain sleep for years. She never wakes up rested. She has daytime tiredness and lack of energy. She goes to sleep within 15-20 minutes and tries to keep a scheduled bedtime and wake time. She goes to bed around 10 PM and falls asleep within 15-20 minutes. She has multiple nighttime awakenings. She has a tracker which also tracks her sleep and she shares data with me on her phone which indicates that she has sometimes 10 or 12 awakenings. She has had nights where she slept only 2 or 3 hours. She has a rise time 5:30 or 6 AM. She never wakes up rested. While her Epworth sleepiness score is 0 out of 24 she says that most of the time she has no time to rest even if she wanted to. 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 She does not smoke or drink alcohol. She has reduced her caffeine intake to about 2 glasses of soda per day not after lunch typically. She denies restless leg symptoms and is not sure if she twitches in her sleep but does have discomfort in her legs at times which she has attributed to her rheumatoid arthritis flareup. Currently she is on Enbrel only. She is to be on methotrexate up until last year but developed shingles at the base of her skull on the left  side and behind her left ear. She has to get up to use the bathroom once per night on average, sometimes more, and denies morning headaches typically. There is a TV in her bedroom and she watches TV at night but turns it off before falling asleep. She has a little dog in her bed. She sleeps with her husband who is a very deep sleeper. While she is noted to snore she's not sure if she has apneic pauses. She has a family history of obstructive sleep apnea in her maternal uncle.  Her Past Medical History Is Significant For: Past Medical History  Diagnosis Date  . Rheumatoid arthritis     Her Past Surgical History Is Significant For: Past Surgical History  Procedure Laterality Date  . C sections      x2    Her Family History Is Significant For: Family History  Problem Relation Age of Onset  . Diabetes Father   . Dementia Father   . Hypertension Mother     Her Social History Is Significant For: History   Social History  . Marital Status: Married    Spouse Name: N/A    Number of Children: N/A  . Years of Education: N/A   Social History Main Topics  . Smoking status: Never Smoker   . Smokeless tobacco: Never Used  . Alcohol Use: No  . Drug Use: No  . Sexual Activity: None  Other Topics Concern  . None   Social History Narrative    Her Allergies Are:  Allergies  Allergen Reactions  . Hydrocodone-Acetaminophen Itching and Rash  . Other Itching    Dilaudid  . Oxycodone-Acetaminophen Itching and Rash  . Ultracet [Tramadol-Acetaminophen] Itching and Rash    REACTION: itching  . Amoxicillin     REACTION: hives  . Propoxyphene N-Acetaminophen     REACTION: itching  :   Her Current Medications Are:  Outpatient Encounter Prescriptions as of 11/23/2014  Medication Sig  . Benzocaine-Menthol 15-3.6 MG LOZG Use as directed in the mouth or throat as needed.  . etanercept (ENBREL) 25 MG injection Inject into the skin.  . naproxen (NAPROSYN) 500 MG tablet Take 500 mg by mouth  as needed.  . [DISCONTINUED] Folic Acid 5 MG CAPS Take by mouth.  . [DISCONTINUED] METHOTREXATE, ANTI-RHEUMATIC, PO Take by mouth.  :  Review of Systems:  Out of a complete 14 point review of systems, all are reviewed and negative with the exception of these symptoms as listed below:   Review of Systems Recent cough, joint pain off-and-on with joint swelling, nonrestorative sleep, lack of daytime energy, mood irritability, inability to maintain sleep, feeling sleepy during the day, going to the bathroom at night, multiple nighttime awakenings.  Objective:  Neurologic Exam  Physical Exam Physical Examination:   Filed Vitals:   11/23/14 0831  BP: 119/76  Pulse: 102  Temp: 98.9 F (37.2 C)  Resp: 17    General Examination: The patient is a very pleasant 44 y.o. female in no acute distress. She appears well-developed and well-nourished and well groomed.   HEENT: Normocephalic, atraumatic, pupils are equal, round and reactive to light and accommodation. Funduscopic exam is normal with sharp disc margins noted. Extraocular tracking is good without limitation to gaze excursion or nystagmus noted. Normal smooth pursuit is noted. Hearing is grossly intact. Tympanic membranes are clear bilaterally. Face is symmetric with normal facial animation and normal facial sensation. Speech is clear with no dysarthria noted. There is no hypophonia. There is no lip, neck/head, jaw or voice tremor. Neck is supple with full range of passive and active motion. There are no carotid bruits on auscultation. Oropharynx exam reveals: mild mouth dryness, good dental hygiene and mild airway crowding, due to narrow airway entry and tonsils of 1+ in place. Uvula is normal and tongue is normal in size. Mallampati is class I. Neck size is 15 inches. She really does not have much in the way of overbite. Nasal inspection reveals no significant nasal mucosal bogginess or redness or septal deviation but she has a small nasal  anatomy. Tongue protrudes centrally and palate elevates symmetrically.   Chest: Clear to auscultation without wheezing, rhonchi or crackles noted.  Heart: S1+S2+0, regular and normal without murmurs, rubs or gallops noted.   Abdomen: Soft, non-tender and non-distended with normal bowel sounds appreciated on auscultation.  Extremities: There is no pitting edema in the distal lower extremities bilaterally. Pedal pulses are intact.  Skin: Warm and dry without trophic changes noted. She has significantly dry skin. There are no varicose veins.  Musculoskeletal: exam reveals no obvious joint deformities, tenderness or joint swelling or erythema with the exception of mild arthritic changes in her hands.   Neurologically:  Mental status: The patient is awake, alert and oriented in all 4 spheres. Her immediate and remote memory, attention, language skills and fund of knowledge are appropriate. There is no evidence of aphasia, agnosia, apraxia or anomia.  Speech is clear with normal prosody and enunciation. Thought process is linear. Mood is normal and affect is normal.  Cranial nerves II - XII are as described above under HEENT exam. In addition: shoulder shrug is normal with equal shoulder height noted. Motor exam: Normal bulk, strength and tone is noted. There is no drift, tremor or rebound. Romberg is negative. Reflexes are 2+ throughout. Babinski: Toes are flexor bilaterally. Fine motor skills and coordination: intact with normal finger taps, normal hand movements, normal rapid alternating patting, normal foot taps and normal foot agility.  Cerebellar testing: No dysmetria or intention tremor on finger to nose testing. Heel to shin is unremarkable bilaterally. There is no truncal or gait ataxia.  Sensory exam: intact to light touch, pinprick, vibration, temperature sense in the upper and lower extremities.  Gait, station and balance: She stands with no difficulty. No veering to one side is noted. No  leaning to one side is noted. Posture is age-appropriate and stance is narrow based. Gait shows normal stride length and normal pace. No problems turning are noted. She turns en bloc. Tandem walk is unremarkable. Intact toe and heel stance is noted.               Assessment and Plan:  In summary, KHOLE ARTERBURN is a very pleasant 44 y.o.-year old female with an underlying medical history of sinusitis, cellulitis, rheumatoid arthritis, reflux disease, allergies, shingles in 2015 and obesity, whoi presents with a sleep disorder, manifested by inability to maintain sleep, nonrestorative sleep, daytime tiredness, nocturia, and snoring. In the context of a small airway anatomy, a family history of obstructive sleep apnea and obesity, her history and physical exam are concerning for underlying obstructive sleep apnea (OSA). I had a long chat with the patient about my findings and the diagnosis of OSA, its prognosis and treatment options. We talked about medical treatments, surgical interventions and non-pharmacological approaches. I explained in particular the risks and ramifications of untreated moderate to severe OSA, especially with respect to developing cardiovascular disease down the Road, including congestive heart failure, difficult to treat hypertension, cardiac arrhythmias, or stroke. Even type 2 diabetes has, in part, been linked to untreated OSA. Symptoms of untreated OSA include daytime sleepiness, memory problems, mood irritability and mood disorder such as depression and anxiety, lack of energy, as well as recurrent headaches, especially morning headaches. We talked about trying to maintain a healthy lifestyle in general, as well as the importance of weight control. I encouraged the patient to eat healthy, exercise daily and keep well hydrated, to keep a scheduled bedtime and wake time routine, to not skip any meals and eat healthy snacks in between meals. I advised the patient not to drive when feeling  sleepy. I recommended the following at this time: sleep study with potential positive airway pressure titration. (We will score hypopneas at 3% and split the sleep study into diagnostic and treatment portion, if the estimated. 2 hour AHI is >15/h).   I explained the sleep test procedure to the patient and also outlined possible surgical and non-surgical treatment options of OSA, including the use of a custom-made dental device (which would require a referral to a specialist dentist or oral surgeon), upper airway surgical options, such as pillar implants, radiofrequency surgery, tongue base surgery, and UPPP (which would involve a referral to an ENT surgeon). Rarely, jaw surgery such as mandibular advancement may be considered.  I also explained the CPAP treatment option to the patient, who indicated that she  would be willing to try CPAP if the need arises but she is quick to point out that she is quite claustrophobic and would probably have a difficult time tolerating something on her face. Nevertheless, she is willing to try as she is desperate to feel better. I explained the importance of being compliant with PAP treatment, not only for insurance purposes but primarily to improve Her symptoms, and for the patient's long term health benefit, including to reduce Her cardiovascular risks. I answered all her questions today and the patient was in agreement. I would like to see her back after the sleep study is completed and encouraged her to call with any interim questions, concerns, problems or updates.   Thank you very much for allowing me to participate in the care of this nice patient. If I can be of any further assistance to you please do not hesitate to call me at [phone].  Sincerely,   Huston Foley, MD, PhD

## 2014-11-23 NOTE — Patient Instructions (Signed)

## 2014-11-25 ENCOUNTER — Encounter: Payer: Self-pay | Admitting: Neurology

## 2014-12-21 ENCOUNTER — Ambulatory Visit (INDEPENDENT_AMBULATORY_CARE_PROVIDER_SITE_OTHER): Payer: Federal, State, Local not specified - PPO | Admitting: Neurology

## 2014-12-21 DIAGNOSIS — G4730 Sleep apnea, unspecified: Secondary | ICD-10-CM

## 2014-12-21 DIAGNOSIS — G478 Other sleep disorders: Secondary | ICD-10-CM

## 2014-12-21 DIAGNOSIS — G4734 Idiopathic sleep related nonobstructive alveolar hypoventilation: Secondary | ICD-10-CM

## 2014-12-21 DIAGNOSIS — G4710 Hypersomnia, unspecified: Secondary | ICD-10-CM

## 2014-12-21 DIAGNOSIS — G4733 Obstructive sleep apnea (adult) (pediatric): Secondary | ICD-10-CM

## 2014-12-21 NOTE — Sleep Study (Signed)
Please see the scanned sleep study interpretation located in the Procedure tab within the Chart Review section.Please see the scanned sleep study interpretation located in the Procedure tab within the Chart Review section. 

## 2014-12-31 ENCOUNTER — Telehealth: Payer: Self-pay | Admitting: Neurology

## 2014-12-31 DIAGNOSIS — G4734 Idiopathic sleep related nonobstructive alveolar hypoventilation: Secondary | ICD-10-CM

## 2014-12-31 DIAGNOSIS — G4733 Obstructive sleep apnea (adult) (pediatric): Secondary | ICD-10-CM

## 2014-12-31 NOTE — Telephone Encounter (Signed)
Please call and notify the patient that the recent sleep study did confirm the diagnosis of obstructive sleep apnea and that I recommend treatment for this in the form of CPAP, especially, since she kept dropping her O2 sats into the 80s. This will require a repeat sleep study for proper titration and mask fitting. Please explain to patient and arrange for a CPAP titration study. I have placed an order in the chart. Thanks, Huston Foley, MD, PhD [facility])

## 2015-01-03 ENCOUNTER — Encounter: Payer: Self-pay | Admitting: Neurology

## 2015-01-03 NOTE — Telephone Encounter (Signed)
Patient was contacted and provided the results of her sleep study which did confirm a diagnosis of Obstructive sleep apnea.  Patient was advised that a CPAP titration study was suggested for treatment.  The patient was understanding and scheduled her CPAP study for April 1st at 9:30 pm.  Dr. Pollyann Kennedy was faxed a copy of the report.

## 2015-03-10 ENCOUNTER — Ambulatory Visit (INDEPENDENT_AMBULATORY_CARE_PROVIDER_SITE_OTHER): Payer: Federal, State, Local not specified - PPO | Admitting: Neurology

## 2015-03-10 VITALS — BP 130/83 | HR 102 | Ht 66.0 in | Wt 209.0 lb

## 2015-03-10 DIAGNOSIS — G4733 Obstructive sleep apnea (adult) (pediatric): Secondary | ICD-10-CM

## 2015-03-10 DIAGNOSIS — G479 Sleep disorder, unspecified: Secondary | ICD-10-CM

## 2015-03-10 NOTE — Sleep Study (Signed)
See scanned documents in Encounters tab 

## 2015-03-21 ENCOUNTER — Telehealth: Payer: Self-pay | Admitting: Neurology

## 2015-03-21 DIAGNOSIS — G4733 Obstructive sleep apnea (adult) (pediatric): Secondary | ICD-10-CM

## 2015-03-21 NOTE — Telephone Encounter (Signed)
Please call and inform patient that I have entered an order for treatment with PAP. She did well during the latest sleep study with CPAP. We will, therefore, arrange for a machine for home use through a DME (durable medical equipment) company of Her choice; and I will see the patient back in follow-up in about 8 weeks. Please also explain to the patient that I will be looking out for compliance data downloaded from the machine, which can be done remotely through a modem at times or stored on an SD card in the back of the machine. At the time of the followup appointment we will discuss sleep study results and how it is going with PAP treatment at home. Please advise patient to bring Her machine at the time of the visit; at least for the first visit, even though this is cumbersome. Bringing the machine for every visit after that may not be needed, but often helps for the first visit. Please also make sure, the patient has a follow-up appointment with me in about 8 weeks from the setup date, thanks.   Huston Foley, MD, PhD [facility])

## 2015-03-22 ENCOUNTER — Telehealth: Payer: Self-pay | Admitting: Neurology

## 2015-03-22 NOTE — Telephone Encounter (Signed)
Faxed to Dr. Lenise Arena today.

## 2015-03-22 NOTE — Telephone Encounter (Signed)
Left message to call back for sleep study results.  

## 2015-03-22 NOTE — Telephone Encounter (Signed)
Duplicate message. 

## 2015-03-22 NOTE — Telephone Encounter (Signed)
I spoke to patient. She is aware of results and would like to start therapy. She has Acupuncturist and lives in [place]. I will refer her to a DME company and mail a letter to the patient with this information and a reminder to keep f/u and the importance of compliance.

## 2015-03-22 NOTE — Telephone Encounter (Signed)
Pt calling back for results please call dg

## 2015-05-24 ENCOUNTER — Ambulatory Visit: Payer: Self-pay | Admitting: Neurology

## 2015-06-14 ENCOUNTER — Ambulatory Visit (INDEPENDENT_AMBULATORY_CARE_PROVIDER_SITE_OTHER): Payer: Federal, State, Local not specified - PPO | Admitting: Neurology

## 2015-06-14 ENCOUNTER — Encounter (INDEPENDENT_AMBULATORY_CARE_PROVIDER_SITE_OTHER): Payer: Self-pay

## 2015-06-14 ENCOUNTER — Encounter: Payer: Self-pay | Admitting: Neurology

## 2015-06-14 VITALS — BP 110/78 | HR 90 | Resp 16 | Ht 66.0 in | Wt 214.0 lb

## 2015-06-14 DIAGNOSIS — G4730 Sleep apnea, unspecified: Secondary | ICD-10-CM

## 2015-06-14 DIAGNOSIS — G4700 Insomnia, unspecified: Secondary | ICD-10-CM | POA: Diagnosis not present

## 2015-06-14 DIAGNOSIS — G4733 Obstructive sleep apnea (adult) (pediatric): Secondary | ICD-10-CM | POA: Diagnosis not present

## 2015-06-14 DIAGNOSIS — Z9989 Dependence on other enabling machines and devices: Secondary | ICD-10-CM

## 2015-06-14 NOTE — Patient Instructions (Addendum)
Please continue using your CPAP regularly. While your insurance requires that you use CPAP at least 4 hours each night on 70% of the nights, I recommend, that you not skip any nights and use it throughout the night if you can. Getting used to CPAP and staying with the treatment long term does take time and patience and discipline. Untreated obstructive sleep apnea when it is moderate to severe can have an adverse impact on cardiovascular health and raise her risk for heart disease, arrhythmias, hypertension, congestive heart failure, stroke and diabetes. Untreated obstructive sleep apnea causes sleep disruption, nonrestorative sleep, and sleep deprivation. This can have an impact on your day to day functioning and cause daytime sleepiness and impairment of cognitive function, memory loss, mood disturbance, and problems focussing. Using CPAP regularly can improve these symptoms.  Keep up the good work! I will see you back in 6 months for sleep apnea check up.   You can try Melatonin at night for sleep: take 3 to 5 mg one to 2 hours before your bedtime. May go up to 10 mg if needed. Try to lower your amitriptyline to 50 mg which is half a pill each night. As discussed, it can cause significant mouth dryness.

## 2015-06-14 NOTE — Progress Notes (Signed)
Subjective:    Patient ID: Jean Hernandez is a 45 y.o. female.  HPI     Interim history:   Jean Hernandez is a 44 year old right-handed woman with an underlying medical history of sinusitis, cellulitis, rheumatoid arthritis, reflux disease, allergies, shingles in 2015 and obesity, who presents for follow-up consultation of Jean Hernandez sleep apnea, now on treatment with CPAP therapy. The patient is unaccompanied today. I first met Jean Hernandez on 11/23/2014 at the request of Jean Hernandez primary care provider, at which time she reported difficulty with sleep maintenance, nonrestorative sleep, daytime tiredness and lack of energy. I invited Jean Hernandez back for sleep study. She had a baseline sleep study, followed by a CPAP titration study and I went over Jean Hernandez test results with Jean Hernandez in detail today. Jean Hernandez baseline sleep study from 12/21/2014 showed a sleep efficiency of 83.6%, latency to sleep was 28.5 minutes and wake after sleep onset was 53 minutes with mild to moderate sleep fragmentation noted. She was anxious upon arrival. Jean Hernandez anxiety improved. She had an elevated arousal index. She had a normal percentage of light stage sleep, a normal percentage of slow-wave sleep and a slightly elevated percentage of REM sleep of 26.9% with a prolonged REM latency of 158.5 minutes. She had no significant PLMS, EKG or EEG changes. She had mild to moderate snoring. She had a total AHI of 13.5 per hour, rising to 25.2 per hour during REM sleep and 25.2 per hour in the supine position. Average oxygen saturation was only 90%, nadir was 83%, time below 90% saturation was 4 hours and 46 minutes, time below 88% saturation was 1 hour and 7 minutes. Based on Jean Hernandez sleep-related complaints and Jean Hernandez sleep study results I invited Jean Hernandez back for a CPAP titration study. She had this on 03/10/2015. Sleep efficiency was 83.3%. Sleep latency was 33 minutes, wake after sleep onset was 31 minutes with moderate sleep fragmentation noted. She had an increased percentage of slow-wave  sleep at 39.9% and a increased percentage of REM sleep at 28% with a reduced REM latency of 8.5 minutes. She had no significant PLMS, EKG or EEG changes. Average oxygen saturation was 93% which was improved and nadir was 86%. Time below 90% saturation was 47 minutes, oxygen nadir was 88% on a pressure of 9 cm. CPAP was titrated from a pressure of 5 cm to 9 cm. Jean Hernandez AHI was 4.3 per hour on the final pressure with supine REM sleep achieved. A strong Jean Hernandez test results I prescribed CPAP therapy for home use.  Today, 06/14/2015: I reviewed Jean Hernandez CPAP compliance data from 05/14/2015 through 06/12/2015 which is a total of 30 days during which time she used Jean Hernandez machine 29 days with percent used days greater than 4 hours at 87%, indicating very good compliance with an average usage of 5 hours and 26 minutes, residual AHI low at 0.6 per hour, leaked low with the 95th percentile at 1.7 L/m on a pressure of 9 cm with EPR of 3.   Today, 06/14/2015: She reports that she may wake up better rested and she does report compliance with treatment with the exception of a couple of days where she had congestion and could not tolerate it. However, she still has issues with sleep maintenance and this seems unchanged. She wakes up almost nightly at 2 AM and unless she takes amitriptyline 100 mg each night she will not sleep through the night. Symptoms started in 2007 but no incident or medical issue or trauma or medication change triggered it. She  has had chronic problems since then. She has never tried melatonin but has tried over-the-counter p.m. type medications and Benadryl. She does complain of mouth dryness. She has tried Biotene for this. She did not realize that amitriptyline can cause mouth dryness.  Previously:   11/23/2014: She reports inability to maintain sleep for years. She never wakes up rested. She has daytime tiredness and lack of energy. She goes to sleep within 15-20 minutes and tries to keep a scheduled bedtime and  wake time. She goes to bed around 10 PM and falls asleep within 15-20 minutes. She has multiple nighttime awakenings. She has a tracker which also tracks Jean Hernandez sleep and she shares data with me on Jean Hernandez phone which indicates that she has sometimes 10 or 12 awakenings. She has had nights where she slept only 2 or 3 hours. She has a rise time 5:30 or 6 AM. She never wakes up rested. While Jean Hernandez Epworth sleepiness score is 0 out of 24 she says that most of the time she has no time to rest even if she wanted to. 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 She does not smoke or drink alcohol. She has reduced Jean Hernandez caffeine intake to about 2 glasses of soda per day not after lunch typically. She denies restless leg symptoms and is not sure if she twitches in Jean Hernandez sleep but does have discomfort in Jean Hernandez legs at times which she has attributed to Jean Hernandez rheumatoid arthritis flareup. Currently she is on Enbrel only. She is to be on methotrexate up until last year but developed shingles at the base of Jean Hernandez skull on the left side and behind Jean Hernandez left ear. She has to get up to use the bathroom once per night on average, sometimes more, and denies morning headaches typically. There is a TV in Jean Hernandez bedroom and she watches TV at night but turns it off before falling asleep. She has a little dog in Jean Hernandez bed. She sleeps with Jean Hernandez husband who is a very deep sleeper. While she is noted to snore she's not sure if she has apneic pauses. She has a family history of obstructive sleep apnea in Jean Hernandez maternal uncle.  Jean Hernandez Past Medical History Is Significant For: Past Medical History  Diagnosis Date  . Rheumatoid arthritis     Jean Hernandez Past Surgical History Is Significant For: Past Surgical History  Procedure Laterality Date  . C sections      x2    Jean Hernandez Family History Is Significant For: Family History  Problem Relation Age of Onset  . Diabetes Father   . Dementia  Father   . Hypertension Mother     Jean Hernandez Social History Is Significant For: Social History   Social History  . Marital Status: Married    Spouse Name: N/A  . Number of Children: N/A  . Years of Education: N/A   Social History Main Topics  . Smoking status: Never Smoker   . Smokeless tobacco: Never Used  . Alcohol Use: No  . Drug Use: No  . Sexual Activity: Not Asked   Other Topics Concern  . None   Social History Narrative    Jean Hernandez Allergies Are:  Allergies  Allergen Reactions  . Hydrocodone-Acetaminophen Itching and Rash  . Other Itching    Dilaudid  . Oxycodone-Acetaminophen Itching and Rash  . Ultracet [Tramadol-Acetaminophen] Itching and Rash    REACTION: itching  . Amoxicillin  REACTION: hives  . Propoxyphene N-Acetaminophen     REACTION: itching  :   Jean Hernandez Current Medications Are:  Outpatient Encounter Prescriptions as of 06/14/2015  Medication Sig  . amitriptyline (ELAVIL) 100 MG tablet Take 100 mg by mouth at bedtime.  Marland Kitchen etanercept (ENBREL) 25 MG injection Inject into the skin.  . naproxen (NAPROSYN) 500 MG tablet Take 500 mg by mouth as needed.  . [DISCONTINUED] Benzocaine-Menthol 15-3.6 MG LOZG Use as directed in the mouth or throat as needed.   No facility-administered encounter medications on file as of 06/14/2015.  :  Review of Systems:  Out of a complete 14 point review of systems, all are reviewed and negative with the exception of these symptoms as listed below:    Review of Systems  Neurological:       Patient states that she is doing well on CPAP. Reports that she still wakes up at 2-3am every night if she does not take amitriptyline.     Objective:  Neurologic Exam  Physical Exam Physical Examination:   Filed Vitals:   06/14/15 1423  BP: 110/78  Pulse: 90  Resp: 16    General Examination: The patient is a very pleasant 44 y.o. female in no acute distress. She appears well-developed and well-nourished and well groomed. She is in  good spirits today.   HEENT: Normocephalic, atraumatic, pupils are equal, round and reactive to light and accommodation. Funduscopic exam is normal with sharp disc margins noted. Extraocular tracking is good without limitation to gaze excursion or nystagmus noted. Normal smooth pursuit is noted. Hearing is grossly intact. Face is symmetric with normal facial animation and normal facial sensation. Speech is clear with no dysarthria noted. There is no hypophonia. There is no lip, neck/head, jaw or voice tremor. Neck is supple with full range of passive and active motion. There are no carotid bruits on auscultation. Oropharynx exam reveals: mild mouth dryness, good dental hygiene and mild airway crowding, due to narrow airway entry and tonsils of 1+ in place. Uvula is normal and tongue is normal in size. Mallampati is class I. She really does not have much in the way of overbite. Nasal inspection reveals no significant nasal mucosal bogginess or redness or septal deviation but she has a small nasal anatomy. Tongue protrudes centrally and palate elevates symmetrically.   Chest: Clear to auscultation without wheezing, rhonchi or crackles noted.  Heart: S1+S2+0, regular and normal without murmurs, rubs or gallops noted.   Abdomen: Soft, non-tender and non-distended with normal bowel sounds appreciated on auscultation.  Extremities: There is no pitting edema in the distal lower extremities bilaterally. Pedal pulses are intact.  Skin: Warm and dry without trophic changes noted. She has significantly dry skin. There are no varicose veins.  Musculoskeletal: exam reveals no obvious joint deformities, tenderness or joint swelling or erythema with the exception of mild arthritic changes in Jean Hernandez hands.   Neurologically:  Mental status: The patient is awake, alert and oriented in all 4 spheres. Jean Hernandez immediate and remote memory, attention, language skills and fund of knowledge are appropriate. There is no evidence of  aphasia, agnosia, apraxia or anomia. Speech is clear with normal prosody and enunciation. Thought process is linear. Mood is normal and affect is normal.  Cranial nerves II - XII are as described above under HEENT exam. In addition: shoulder shrug is normal with equal shoulder height noted. Motor exam: Normal bulk, strength and tone is noted. There is no drift, tremor or rebound. Romberg is negative.  Reflexes are 2+ throughout. Fine motor skills and coordination: intact.  Cerebellar testing: No dysmetria or intention tremor on finger to nose testing. There is no truncal or gait ataxia.  Sensory exam: intact to light touch in the upper and lower extremities.  Gait, station and balance: She stands with no difficulty. No veering to one side is noted. No leaning to one side is noted. Posture is age-appropriate and stance is narrow based. Gait shows normal stride length and normal pace. No problems turning are noted. She turns en bloc. Tandem walk is unremarkable.            Assessment and Plan:  In summary, ZANE SAMSON is a very pleasant 44 year old female with an underlying medical history of sinusitis, cellulitis, rheumatoid arthritis, reflux disease, allergies, shingles in 2015 and obesity, who presents for follow-up consultation after Jean Hernandez sleep studies in March and May 2016. She has been diagnosed with overall mild to moderate obstructive sleep apnea and has established treatment with CPAP with very good compliance. I talked to Jean Hernandez about Jean Hernandez sleep test results in detail. She still has trouble with sleep maintenance unless she takes amitriptyline which is currently at 100 mg each night. She is advised to try to cut it back to 50 mg each night if possible. It can cause mouth dryness which she is suffering from at this time. She is advised to discuss this with Jean Hernandez prescribing provider as well. She can try melatonin, 3-5 mg 1-2 hours before bedtime for sleep. She can go up to 10 mg if needed.  She did confide  in me that she did not have a very good experience during Jean Hernandez first sleep study and I apologized for this. She had a much better experience during Jean Hernandez CPAP titration study and she shared that with me as well. I appreciated Jean Hernandez candidate input. I congratulated Jean Hernandez on Jean Hernandez treatment adherence despite not having necessarily telltale improvement. She is advised to continue using CPAP regularly as she may find benefit in the long run. She is advised to follow-up with me in 6 months, sooner if the need arises and is encouraged to call or email with any updates. I gave Jean Hernandez written instructions, answered all Jean Hernandez questions today and she was in agreement with the plan.  I spent 25 minutes in total face-to-face time with the patient, more than 50% of which was spent in counseling and coordination of care, reviewing test results, reviewing medication and discussing or reviewing the diagnosis of OSA and insomnia, the prognosis and treatment options.

## 2015-12-13 ENCOUNTER — Ambulatory Visit: Payer: Federal, State, Local not specified - PPO | Admitting: Neurology

## 2016-01-29 DIAGNOSIS — J029 Acute pharyngitis, unspecified: Secondary | ICD-10-CM | POA: Diagnosis not present

## 2016-02-01 ENCOUNTER — Ambulatory Visit: Payer: Federal, State, Local not specified - PPO | Admitting: Neurology

## 2016-02-09 DIAGNOSIS — R05 Cough: Secondary | ICD-10-CM | POA: Diagnosis not present

## 2016-02-09 DIAGNOSIS — J45909 Unspecified asthma, uncomplicated: Secondary | ICD-10-CM | POA: Diagnosis not present

## 2016-02-27 DIAGNOSIS — L600 Ingrowing nail: Secondary | ICD-10-CM | POA: Diagnosis not present

## 2016-03-06 DIAGNOSIS — G44219 Episodic tension-type headache, not intractable: Secondary | ICD-10-CM | POA: Diagnosis not present

## 2016-04-05 DIAGNOSIS — Z791 Long term (current) use of non-steroidal anti-inflammatories (NSAID): Secondary | ICD-10-CM | POA: Diagnosis not present

## 2016-04-05 DIAGNOSIS — Z881 Allergy status to other antibiotic agents status: Secondary | ICD-10-CM | POA: Diagnosis not present

## 2016-04-05 DIAGNOSIS — Z79899 Other long term (current) drug therapy: Secondary | ICD-10-CM | POA: Diagnosis not present

## 2016-04-05 DIAGNOSIS — R0789 Other chest pain: Secondary | ICD-10-CM | POA: Diagnosis not present

## 2016-04-05 DIAGNOSIS — Z885 Allergy status to narcotic agent status: Secondary | ICD-10-CM | POA: Diagnosis not present

## 2016-04-05 DIAGNOSIS — M069 Rheumatoid arthritis, unspecified: Secondary | ICD-10-CM | POA: Diagnosis not present

## 2016-04-05 DIAGNOSIS — S299XXA Unspecified injury of thorax, initial encounter: Secondary | ICD-10-CM | POA: Diagnosis not present

## 2016-04-16 ENCOUNTER — Ambulatory Visit: Payer: Federal, State, Local not specified - PPO | Admitting: Neurology

## 2016-05-01 ENCOUNTER — Telehealth: Payer: Self-pay | Admitting: *Deleted

## 2016-05-01 NOTE — Telephone Encounter (Signed)
I spoke to patient and was able to get her in tomorrow.

## 2016-05-02 ENCOUNTER — Encounter: Payer: Self-pay | Admitting: Neurology

## 2016-05-02 ENCOUNTER — Ambulatory Visit (INDEPENDENT_AMBULATORY_CARE_PROVIDER_SITE_OTHER): Payer: Federal, State, Local not specified - PPO | Admitting: Neurology

## 2016-05-02 VITALS — BP 138/82 | HR 82 | Resp 16 | Ht 66.0 in | Wt 215.0 lb

## 2016-05-02 DIAGNOSIS — G4733 Obstructive sleep apnea (adult) (pediatric): Secondary | ICD-10-CM | POA: Diagnosis not present

## 2016-05-02 DIAGNOSIS — R635 Abnormal weight gain: Secondary | ICD-10-CM | POA: Diagnosis not present

## 2016-05-02 DIAGNOSIS — Z9989 Dependence on other enabling machines and devices: Secondary | ICD-10-CM

## 2016-05-02 NOTE — Patient Instructions (Addendum)
Please get back on the CPAP, we will try a slightly lower pressure.  I placed an order for CPAP supplies.  Please make an appointment with your Primary doctor too.  Try to drink more water.

## 2016-05-02 NOTE — Progress Notes (Signed)
Subjective:    Patient ID: Jean Hernandez is a 45 y.o. female.  HPI     Interim history:   Jean Hernandez is a 45 year old right-handed woman with an underlying medical history of sinusitis, cellulitis, rheumatoid arthritis, reflux disease, allergies, shingles in 2015 and obesity, who presents for follow-up consultation of Jean Hernandez sleep apnea, on treatment with CPAP therapy. The patient is unaccompanied today. I last saw Jean Hernandez on 06/14/2015, at which time we talked about Jean Hernandez sleep study results and Jean Hernandez compliance with CPAP. Jean Hernandez was having trouble maintaining sleep which was a chronic and ongoing problem. I suggested Jean Hernandez try melatonin and actually try to cut back on amitriptyline to 50 mg daily because of mouth dryness issues. Jean Hernandez reported waking up better rested and Jean Hernandez was compliant with treatment. Jean Hernandez felt that Jean Hernandez sleep maintenance issues were unchanged. Jean Hernandez had chronic problems with sleep maintenance issues since 2007.   Today, 05/02/2016: I reviewed Jean Hernandez CPAP compliance data from 04/01/2016 through 04/30/2016 which is a total of 30 days during which time Jean Hernandez used Jean Hernandez machine only 10 days with percent used days greater than 4 hours at 17%, indicating poor compliance with an average usage for all meds of 1 hour and 16 minutes, residual AHI 0.7 per hour, leak low for the 95th percentile of 1.3 L/m on a pressure of 9 cm with EPR of 3.  Today, 05/02/2016: Jean Hernandez reports having had upper respiratory infection and difficulty tolerating CPAP. Jean Hernandez is just now starting to get back on it. Jean Hernandez also did not receive supplies on time and was using Jean Hernandez old mask and supplies. Jean Hernandez has received recent supplies however. Jean Hernandez has been out of the amitriptyline. Jean Hernandez had a prescription previously through Jean Hernandez primary care physician. When Jean Hernandez was on it it did not help very much as I recall with Jean Hernandez sleep maintenance problems. Jean Hernandez tried melatonin which did not help very much either. Jean Hernandez has not seen Jean Hernandez primary care physician in a while. Jean Hernandez  believes Jean Hernandez blood pressure has been increasing gradually as well. Jean Hernandez has gained a little bit of weight. Jean Hernandez rheumatoid arthritis is under control. Jean Hernandez is on Enbrel. Jean Hernandez sees Dr. Trudie Reed.  Previously:  I first met Jean Hernandez on 11/23/2014 at the request of Jean Hernandez primary care provider, at which time Jean Hernandez reported difficulty with sleep maintenance, nonrestorative sleep, daytime tiredness and lack of energy. I invited Jean Hernandez back for sleep study. Jean Hernandez had a baseline sleep study, followed by a CPAP titration study and I went over Jean Hernandez test results with Jean Hernandez in detail today. Jean Hernandez baseline sleep study from 12/21/2014 showed a sleep efficiency of 83.6%, latency to sleep was 28.5 minutes and wake after sleep onset was 53 minutes with mild to moderate sleep fragmentation noted. Jean Hernandez was anxious upon arrival. Jean Hernandez anxiety improved. Jean Hernandez had an elevated arousal index. Jean Hernandez had a normal percentage of light stage sleep, a normal percentage of slow-wave sleep and a slightly elevated percentage of REM sleep of 26.9% with a prolonged REM latency of 158.5 minutes. Jean Hernandez had no significant PLMS, EKG or EEG changes. Jean Hernandez had mild to moderate snoring. Jean Hernandez had a total AHI of 13.5 per hour, rising to 25.2 per hour during REM sleep and 25.2 per hour in the supine position. Average oxygen saturation was only 90%, nadir was 83%, time below 90% saturation was 4 hours and 46 minutes, time below 88% saturation was 1 hour and 7 minutes. Based on Jean Hernandez sleep-related complaints and Jean Hernandez sleep study results I invited Jean Hernandez  back for a CPAP titration study. Jean Hernandez had this on 03/10/2015. Sleep efficiency was 83.3%. Sleep latency was 33 minutes, wake after sleep onset was 31 minutes with moderate sleep fragmentation noted. Jean Hernandez had an increased percentage of slow-wave sleep at 39.9% and a increased percentage of REM sleep at 28% with a reduced REM latency of 8.5 minutes. Jean Hernandez had no significant PLMS, EKG or EEG changes. Average oxygen saturation was 93% which was improved and  nadir was 86%. Time below 90% saturation was 47 minutes, oxygen nadir was 88% on a pressure of 9 cm. CPAP was titrated from a pressure of 5 cm to 9 cm. Jean Hernandez AHI was 4.3 per hour on the final pressure with supine REM sleep achieved. A strong Jean Hernandez test results I prescribed CPAP therapy for home use.    I reviewed Jean Hernandez CPAP compliance data from 05/14/2015 through 06/12/2015 which is a total of 30 days during which time Jean Hernandez used Jean Hernandez machine 29 days with percent used days greater than 4 hours at 87%, indicating very good compliance with an average usage of 5 hours and 26 minutes, residual AHI low at 0.6 per hour, leaked low with the 95th percentile at 1.7 L/m on a pressure of 9 cm with EPR of 3.    11/23/2014: Jean Hernandez reports inability to maintain sleep for years. Jean Hernandez never wakes up rested. Jean Hernandez has daytime tiredness and lack of energy. Jean Hernandez goes to sleep within 15-20 minutes and tries to keep a scheduled bedtime and wake time. Jean Hernandez goes to bed around 10 PM and falls asleep within 15-20 minutes. Jean Hernandez has multiple nighttime awakenings. Jean Hernandez has a tracker which also tracks Jean Hernandez sleep and Jean Hernandez shares data with me on Jean Hernandez phone which indicates that Jean Hernandez has sometimes 10 or 12 awakenings. Jean Hernandez has had nights where Jean Hernandez slept only 2 or 3 hours. Jean Hernandez has a rise time 5:30 or 6 AM. Jean Hernandez never wakes up rested. While Jean Hernandez Epworth sleepiness score is 0 out of 24 Jean Hernandez says that most of the time Jean Hernandez has no time to rest even if Jean Hernandez wanted to. 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 Jean Hernandez does not smoke or drink alcohol. Jean Hernandez has reduced Jean Hernandez caffeine intake to about 2 glasses of soda per day not after lunch typically. Jean Hernandez denies restless leg symptoms and is not sure if Jean Hernandez twitches in Jean Hernandez sleep but does have discomfort in Jean Hernandez legs at times which Jean Hernandez has attributed to Jean Hernandez rheumatoid arthritis flareup. Currently Jean Hernandez is on Enbrel only. Jean Hernandez is to be on methotrexate up until  last year but developed shingles at the base of Jean Hernandez skull on the left side and behind Jean Hernandez left ear. Jean Hernandez has to get up to use the bathroom once per night on average, sometimes more, and denies morning headaches typically. There is a TV in Jean Hernandez bedroom and Jean Hernandez watches TV at night but turns it off before falling asleep. Jean Hernandez has a little dog in Jean Hernandez bed. Jean Hernandez sleeps with Jean Hernandez husband who is a very deep sleeper. While Jean Hernandez is noted to snore Jean Hernandez's not sure if Jean Hernandez has apneic pauses. Jean Hernandez has a family history of obstructive sleep apnea in Jean Hernandez maternal uncle.   Jean Hernandez Past Medical History Is Significant For: Past Medical History  Diagnosis Date  . Rheumatoid arthritis ([place])     Jean Hernandez Past Surgical History Is Significant For: Past Surgical History  Procedure Laterality Date  .  C sections      x2    Jean Hernandez Family History Is Significant For: Family History  Problem Relation Age of Onset  . Diabetes Father   . Dementia Father   . Hypertension Mother     Jean Hernandez Social History Is Significant For: Social History   Social History  . Marital Status: Married    Spouse Name: N/A  . Number of Children: N/A  . Years of Education: N/A   Social History Main Topics  . Smoking status: Never Smoker   . Smokeless tobacco: Never Used  . Alcohol Use: No  . Drug Use: No  . Sexual Activity: Not Asked   Other Topics Concern  . None   Social History Narrative    Jean Hernandez Allergies Are:  Allergies  Allergen Reactions  . Hydrocodone-Acetaminophen Itching and Rash  . Other Itching    Dilaudid  . Oxycodone-Acetaminophen Itching and Rash  . Ultracet [Tramadol-Acetaminophen] Itching and Rash    REACTION: itching  . Amoxicillin     REACTION: hives  . Propoxyphene N-Acetaminophen     REACTION: itching  :   Jean Hernandez Current Medications Are:  Outpatient Encounter Prescriptions as of 05/02/2016  Medication Sig  . cetirizine (ZYRTEC) 10 MG tablet Take 10 mg by mouth daily.  Marland Kitchen etanercept (ENBREL) 25 MG injection Inject into  the skin.  . fluticasone (FLONASE) 50 MCG/ACT nasal spray Place into both nostrils daily.  . naproxen (NAPROSYN) 500 MG tablet Take 500 mg by mouth as needed.  Marland Kitchen amitriptyline (ELAVIL) 100 MG tablet Take 100 mg by mouth at bedtime. Reported on 05/02/2016   No facility-administered encounter medications on file as of 05/02/2016.  :  Review of Systems:  Out of a complete 14 point review of systems, all are reviewed and negative with the exception of these symptoms as listed below:   Review of Systems  Neurological:       Patient states that Jean Hernandez has had trouble sleeping at night. Jean Hernandez is out of the Amitriptyline. Patient has been unable to use CPAP due to respiratory infection, a period of vomiting during the night, and will take off if Jean Hernandez is not sleeping at night.     Objective:  Neurologic Exam  Physical Exam Physical Examination:   Filed Vitals:   05/02/16 1038  BP: 138/82  Pulse: 82  Resp: 16    General Examination: The patient is a very pleasant 45 y.o. female in no acute distress. Jean Hernandez appears well-developed and well-nourished and well groomed.   HEENT: Normocephalic, atraumatic, pupils are equal, round and reactive to light and accommodation. Jean Hernandez has corrective eyeglasses. Extraocular tracking is good without limitation to gaze excursion or nystagmus noted. Normal smooth pursuit is noted. Hearing is grossly intact. Face is symmetric with normal facial animation and normal facial sensation. Speech is clear with no dysarthria noted. There is no hypophonia. There is no lip, neck/head, jaw or voice tremor. Neck is supple with full range of passive and active motion. There are no carotid bruits on auscultation. Oropharynx exam reveals: Moderate to severe mouth dryness, good dental hygiene and mild airway crowding, due to narrow airway entry and tonsils of 1+ in place. Uvula is normal and tongue is normal in size. Mallampati is class I. Jean Hernandez really does not have much in the way of overbite.  Nasal inspection reveals no significant nasal mucosal bogginess or redness or septal deviation but Jean Hernandez has a small nasal anatomy. Tongue protrudes centrally and palate elevates symmetrically.   Chest:  Clear to auscultation without wheezing, rhonchi or crackles noted.  Heart: S1+S2+0, regular and normal without murmurs, rubs or gallops noted.   Abdomen: Soft, non-tender and non-distended with normal bowel sounds appreciated on auscultation.  Extremities: There is no pitting edema in the distal lower extremities bilaterally. Pedal pulses are intact.  Skin: Warm and dry without trophic changes noted. Jean Hernandez has significantly dry skin. There are no varicose veins.  Musculoskeletal: exam reveals no obvious joint deformities, tenderness or joint swelling or erythema with the exception of mild arthritic changes in Jean Hernandez hands, left knee slightly larger than right, mild left knee tenderness reported.   Neurologically:  Mental status: The patient is awake, alert and oriented in all 4 spheres. Jean Hernandez immediate and remote memory, attention, language skills and fund of knowledge are appropriate. There is no evidence of aphasia, agnosia, apraxia or anomia. Speech is clear with normal prosody and enunciation. Thought process is linear. Mood is normal and affect is normal.  Cranial nerves II - XII are as described above under HEENT exam. In addition: shoulder shrug is normal with equal shoulder height noted. Motor exam: Normal bulk, strength and tone is noted. There is no drift, tremor or rebound. Romberg is negative. Reflexes are 2+ throughout. Fine motor skills and coordination: intact.  Cerebellar testing: No dysmetria or intention tremor on finger to nose testing. There is no truncal or gait ataxia.  Sensory exam: intact to light touch in the upper and lower extremities.  Gait, station and balance: Jean Hernandez stands with no difficulty. No veering to one side is noted. No leaning to one side is noted. Posture is  age-appropriate and stance is narrow based. Gait shows normal stride length and normal pace. No problems turning are noted. Jean Hernandez turns en bloc. Tandem walk is unremarkable.            Assessment and Plan:  In summary, AIYSHA JILLSON is a very pleasant 45 year old female with an underlying medical history of sinusitis, cellulitis, rheumatoid arthritis, reflux disease, allergies, shingles in 2015 and obesity, who presents for follow-up consultation Of Jean Hernandez obstructive sleep apnea. Jean Hernandez had a baseline sleep study in March 2016, followed by a CPAP titration study in May 2016. Jean Hernandez has mild to moderate obstructive sleep apnea and had an more compliant with CPAP therapy at a pressure of 9 cm. Jean Hernandez has recently restarted using it and still has ongoing issues with sleep maintenance problems. Jean Hernandez had been on amitriptyline through Jean Hernandez primary care physician. The amitriptyline also was not very helpful with Jean Hernandez sleep maintenance issues. Jean Hernandez is encouraged to talk to Jean Hernandez primary care physician about this again and perhaps even consider a referral to psychiatry. Jean Hernandez feels that when Jean Hernandez restarted CPAP the pressure felt too high. We can certainly try to reduce it. To that end, I will reduce it to 8 cm. I also placed an order for new supplies. Jean Hernandez is encouraged to try to be fully compliant with CPAP therapy. Jean Hernandez is encouraged to drink more water and reduce Jean Hernandez soda and tea intake. Jean Hernandez is trying to be more active physically but has limitations secondary to Jean Hernandez rheumatoid arthritis. Jean Hernandez tries to walk some.  Jean Hernandez is advised to follow-up with me in 6 months, sooner if the need arises and is encouraged to call or email with any updates. I gave Jean Hernandez written instructions, answered all Jean Hernandez questions today and Jean Hernandez was in agreement with the plan.  I spent 25 minutes in total face-to-face time with the patient, more  than 50% of which was spent in counseling and coordination of care, reviewing test results, reviewing medication and discussing or  reviewing the diagnosis of OSA, chronic sleep maintenance insomnia, the prognosis and treatment options.

## 2016-05-03 ENCOUNTER — Ambulatory Visit: Payer: Federal, State, Local not specified - PPO | Admitting: Neurology

## 2016-05-30 ENCOUNTER — Ambulatory Visit: Payer: Federal, State, Local not specified - PPO | Admitting: Neurology

## 2016-06-04 DIAGNOSIS — M170 Bilateral primary osteoarthritis of knee: Secondary | ICD-10-CM | POA: Diagnosis not present

## 2016-06-04 DIAGNOSIS — M0579 Rheumatoid arthritis with rheumatoid factor of multiple sites without organ or systems involvement: Secondary | ICD-10-CM | POA: Diagnosis not present

## 2016-06-04 DIAGNOSIS — Z79899 Other long term (current) drug therapy: Secondary | ICD-10-CM | POA: Diagnosis not present

## 2016-06-19 ENCOUNTER — Ambulatory Visit (INDEPENDENT_AMBULATORY_CARE_PROVIDER_SITE_OTHER): Payer: Federal, State, Local not specified - PPO

## 2016-06-19 ENCOUNTER — Ambulatory Visit (INDEPENDENT_AMBULATORY_CARE_PROVIDER_SITE_OTHER): Payer: Federal, State, Local not specified - PPO | Admitting: Family Medicine

## 2016-06-19 ENCOUNTER — Encounter: Payer: Self-pay | Admitting: Family Medicine

## 2016-06-19 VITALS — BP 129/75 | HR 81 | Temp 97.7°F | Ht 66.0 in | Wt 217.4 lb

## 2016-06-19 DIAGNOSIS — M0579 Rheumatoid arthritis with rheumatoid factor of multiple sites without organ or systems involvement: Secondary | ICD-10-CM | POA: Diagnosis not present

## 2016-06-19 DIAGNOSIS — R05 Cough: Secondary | ICD-10-CM | POA: Diagnosis not present

## 2016-06-19 DIAGNOSIS — R059 Cough, unspecified: Secondary | ICD-10-CM

## 2016-06-19 DIAGNOSIS — H6521 Chronic serous otitis media, right ear: Secondary | ICD-10-CM

## 2016-06-19 MED ORDER — LEVOFLOXACIN 500 MG PO TABS: 500.0000 mg | ORAL_TABLET | Freq: Every day | ORAL | 0 refills | Status: DC

## 2016-06-19 MED ORDER — LEVALBUTEROL HCL 1.25 MG/0.5ML IN NEBU
1.2500 mg | INHALATION_SOLUTION | Freq: Once | RESPIRATORY_TRACT | Status: AC
  Administered 2016-06-19: 1.25 mg via RESPIRATORY_TRACT

## 2016-06-19 MED ORDER — BETAMETHASONE SOD PHOS & ACET 6 (3-3) MG/ML IJ SUSP
6.0000 mg | Freq: Once | INTRAMUSCULAR | Status: AC
  Administered 2016-06-19: 6 mg via INTRAMUSCULAR

## 2016-06-19 MED ORDER — AMOXICILLIN-POT CLAVULANATE 875-125 MG PO TABS: 1.0000 | ORAL_TABLET | Freq: Two times a day (BID) | ORAL | 0 refills | Status: DC

## 2016-06-19 MED ORDER — UMECLIDINIUM-VILANTEROL 62.5-25 MCG/INH IN AEPB: INHALATION_SPRAY | RESPIRATORY_TRACT | 11 refills | Status: DC

## 2016-06-19 NOTE — Progress Notes (Signed)
Subjective:  Patient ID: Jean Hernandez, female    DOB: June 02, 1971  Age: 45 y.o. MRN: [id number]  CC: New Patient (Initial Visit) (pt also c/o right ear crackling/pain-coughing first thing in the morning)   HPI Jean Hernandez presents for Occasional cracking in right ear only. Loud noise hurts right ear. She states that her hearing overall is just fine. There is no tenderness. The pain is intermittent. There is some mild upper respiratory congestion. She has been taking Zyrtec and that didn't seem to be effective so she switched to Claritin recently. AM Cough. Responds to proair inhaler. Allergies to dogs, others.    Patient suffers from rheumatoid arthritis affecting primarily the hands. On embrel without adequate relief for the hands. Therefore she was started on Arava 2 weeks ago. Pain & swelling in left knee. More in hands, left elbow led to addition of new med. She has a rheumatologist she is working with prescribed regularly for her. She had shingles in the past but denies postherpetic neuralgia although she can feel the scar on the back of her neck from it. She has been told that she is not a candidate for the shingles vaccine due to the immunosuppression caused by her medications. she notes that she used methotrexate in the remote past. This caused significant hair loss. After that she was placed on Enbrel which worked better without the hair loss until recently pain seemed to flare.   History Jean Hernandez has a past medical history of Rheumatoid arthritis (HCC).   She has a past surgical history that includes C sections; colonsocopy (2012); and Abdominal hysterectomy.   Her family history includes Dementia in her father; Diabetes in her father; Hypertension in her mother.She reports that she has never smoked. She has never used smokeless tobacco. She reports that she does not drink alcohol or use drugs.  Current Outpatient Prescriptions on File Prior to Visit  Medication Sig Dispense Refill  .  cetirizine (ZYRTEC) 10 MG tablet Take 10 mg by mouth daily.    Marland Kitchen etanercept (ENBREL) 25 MG injection Inject 50 mg into the skin once a week.     . fluticasone (FLONASE) 50 MCG/ACT nasal spray Place into both nostrils daily.    . naproxen (NAPROSYN) 500 MG tablet Take 500 mg by mouth as needed.     No current facility-administered medications on file prior to visit.     ROS Review of Systems  Constitutional: Negative for appetite change, chills, diaphoresis, fatigue, fever and unexpected weight change.  HENT: Negative for congestion, ear pain, hearing loss, postnasal drip, rhinorrhea, sneezing, sore throat and trouble swallowing.   Eyes: Negative for pain.  Respiratory: Positive for cough and shortness of breath. Negative for chest tightness.   Cardiovascular: Negative for chest pain and palpitations.  Gastrointestinal: Negative for abdominal pain, constipation, diarrhea, nausea and vomiting.  Endocrine: Negative for cold intolerance, heat intolerance, polydipsia, polyphagia and polyuria.  Genitourinary: Negative for dysuria, frequency and menstrual problem.  Musculoskeletal: Positive for arthralgias, back pain, joint swelling, myalgias and neck pain. Negative for gait problem and neck stiffness.  Skin: Negative for rash.  Allergic/Immunologic: Negative for environmental allergies.  Neurological: Negative for dizziness, weakness, numbness and headaches.  Psychiatric/Behavioral: Positive for sleep disturbance. Negative for agitation and dysphoric mood. The patient is not nervous/anxious.     Objective:  BP 129/75   Pulse 81   Temp 97.7 F (36.5 C) (Oral)   Ht 5\' 6"  (1.676 m)   Wt 217 lb 6 oz (  98.6 kg)   BMI 35.09 kg/m   Physical Exam  Assessment & Plan:   Jean Hernandez was seen today for new patient (initial visit).  Diagnoses and all orders for this visit:  Rheumatoid arthritis involving multiple sites with positive rheumatoid factor (HCC)  Cough -     levalbuterol (XOPENEX)  nebulizer solution 1.25 mg; Take 1.25 mg by nebulization once. -     PR EVAL OF BRONCHOSPASM -     DG Chest 2 View; Future  Right chronic serous otitis media -     betamethasone acetate-betamethasone sodium phosphate (CELESTONE) injection 6 mg; Inject 1 mL (6 mg total) into the muscle once.  Other orders -     Discontinue: amoxicillin-clavulanate (AUGMENTIN) 875-125 MG tablet; Take 1 tablet by mouth 2 (two) times daily. Take all of this medication -     levofloxacin (LEVAQUIN) 500 MG tablet; Take 1 tablet (500 mg total) by mouth daily. For 10 days -     umeclidinium-vilanterol (ANORO ELLIPTA) 62.5-25 MCG/INH AEPB; One puff daily  Patient was noted to have moderate obstruction and diminished vital capacity on pulmonary function testing today.  Meds ordered this encounter  Medications  . leflunomide (ARAVA) 20 MG tablet    Sig: Take 20 mg by mouth daily.  Marland Kitchen DISCONTD: amoxicillin-clavulanate (AUGMENTIN) 875-125 MG tablet    Sig: Take 1 tablet by mouth 2 (two) times daily. Take all of this medication    Dispense:  28 tablet    Refill:  0  . levofloxacin (LEVAQUIN) 500 MG tablet    Sig: Take 1 tablet (500 mg total) by mouth daily. For 10 days    Dispense:  14 tablet    Refill:  0    Please cancel previous scrip for augmentin, thanks, WS  . betamethasone acetate-betamethasone sodium phosphate (CELESTONE) injection 6 mg  . levalbuterol (XOPENEX) nebulizer solution 1.25 mg  . umeclidinium-vilanterol (ANORO ELLIPTA) 62.5-25 MCG/INH AEPB    Sig: One puff daily    Dispense:  1 each    Refill:  11     Follow-up: Return in about 6 months (around 12/17/2016), or if symptoms worsen or fail to improve.  Mechele Claude, M.D.

## 2016-06-20 ENCOUNTER — Telehealth: Payer: Self-pay | Admitting: Family Medicine

## 2016-06-20 NOTE — Telephone Encounter (Signed)
rx taken care of. Pharmacy found her a coupon.

## 2016-06-22 ENCOUNTER — Telehealth: Payer: Self-pay | Admitting: Family Medicine

## 2016-06-22 NOTE — Telephone Encounter (Signed)
Denied.

## 2016-06-28 ENCOUNTER — Telehealth: Payer: Self-pay | Admitting: Family Medicine

## 2016-06-29 ENCOUNTER — Other Ambulatory Visit: Payer: Self-pay | Admitting: Family Medicine

## 2016-06-29 MED ORDER — BENZONATATE 200 MG PO CAPS: 200.0000 mg | ORAL_CAPSULE | Freq: Three times a day (TID) | ORAL | 0 refills | Status: DC | PRN

## 2016-06-29 NOTE — Telephone Encounter (Signed)
I sent in the requested prescription 

## 2016-06-29 NOTE — Telephone Encounter (Signed)
Patient aware that medication has been sent to pharmacy 

## 2016-07-05 DIAGNOSIS — M069 Rheumatoid arthritis, unspecified: Secondary | ICD-10-CM | POA: Diagnosis not present

## 2016-07-31 ENCOUNTER — Encounter: Payer: Self-pay | Admitting: Family Medicine

## 2016-08-01 ENCOUNTER — Ambulatory Visit (INDEPENDENT_AMBULATORY_CARE_PROVIDER_SITE_OTHER): Payer: Federal, State, Local not specified - PPO | Admitting: Family Medicine

## 2016-08-01 ENCOUNTER — Encounter: Payer: Self-pay | Admitting: Family Medicine

## 2016-08-01 VITALS — BP 132/84 | HR 102 | Ht 66.0 in | Wt 217.5 lb

## 2016-08-01 DIAGNOSIS — J4521 Mild intermittent asthma with (acute) exacerbation: Secondary | ICD-10-CM | POA: Diagnosis not present

## 2016-08-01 DIAGNOSIS — R059 Cough, unspecified: Secondary | ICD-10-CM

## 2016-08-01 DIAGNOSIS — R05 Cough: Secondary | ICD-10-CM | POA: Diagnosis not present

## 2016-08-01 MED ORDER — BETAMETHASONE SOD PHOS & ACET 6 (3-3) MG/ML IJ SUSP
6.0000 mg | Freq: Once | INTRAMUSCULAR | Status: AC
  Administered 2016-08-01: 6 mg via INTRAMUSCULAR

## 2016-08-01 MED ORDER — ALBUTEROL SULFATE HFA 108 (90 BASE) MCG/ACT IN AERS: 2.0000 | INHALATION_SPRAY | Freq: Four times a day (QID) | RESPIRATORY_TRACT | 11 refills | Status: DC | PRN

## 2016-08-01 MED ORDER — AZITHROMYCIN 250 MG PO TABS: ORAL_TABLET | ORAL | 0 refills | Status: DC

## 2016-08-01 NOTE — Progress Notes (Signed)
Subjective:  Patient ID: Jean Hernandez, female    DOB: 1971/03/06  Age: 45 y.o. MRN: [id number]  CC: Asthma (pt here today c/o "coughing fits" her inhaler hasn't been helping)   HPI Jean Hernandez presents for Onset 1-2 months ago. She has been using the and oral. It doesn't seem to be helping anymore. She continues to take immune suppressants for rheumatoid arthritis. The cough is getting worse. There is some wheezing and shortness of breath associated. There is no purulent sputum. She has a history of asthma. This was confirmed by pulmonary function testing at her previous office visit.   History Kamari has a past medical history of Rheumatoid arthritis (HCC).   She has a past surgical history that includes C sections; colonsocopy (2012); and Abdominal hysterectomy.   Her family history includes Dementia in her father; Diabetes in her father; Hypertension in her mother.She reports that she has never smoked. She has never used smokeless tobacco. She reports that she does not drink alcohol or use drugs.    ROS Review of Systems  Constitutional: Negative for activity change, appetite change and fever.  HENT: Negative for congestion, rhinorrhea and sore throat.   Eyes: Negative for visual disturbance.  Respiratory: Positive for cough, shortness of breath and wheezing. Negative for chest tightness.   Cardiovascular: Negative for chest pain and palpitations.  Gastrointestinal: Negative for abdominal pain, diarrhea and nausea.  Genitourinary: Negative for dysuria.  Musculoskeletal:       Although she has baseline rheumatoid pain. That seems to be under good control for her currently.    Objective:  BP 132/84   Pulse (!) 102   Ht 5\' 6"  (1.676 m)   Wt 217 lb 8 oz (98.7 kg)   SpO2 94%   BMI 35.11 kg/m   BP Readings from Last 3 Encounters:  08/01/16 132/84  06/19/16 129/75  05/02/16 138/82    Wt Readings from Last 3 Encounters:  08/01/16 217 lb 8 oz (98.7 kg)  06/19/16 217 lb 6  oz (98.6 kg)  05/02/16 215 lb (97.5 kg)     Physical Exam  Constitutional: She is oriented to person, place, and time. She appears well-developed and well-nourished. No distress.  HENT:  Head: Normocephalic and atraumatic.  Right Ear: External ear normal.  Left Ear: External ear normal.  Nose: Nose normal.  Mouth/Throat: Oropharynx is clear and moist.  Eyes: Conjunctivae and EOM are normal. Pupils are equal, round, and reactive to light.  Neck: Normal range of motion. Neck supple. No thyromegaly present.  Cardiovascular: Normal rate, regular rhythm and normal heart sounds.   No murmur heard. Pulmonary/Chest: Effort normal. No respiratory distress. She has wheezes (scattered. Breatrh sounds distant). She has no rales.  Abdominal: Soft. Bowel sounds are normal. She exhibits no distension. There is no tenderness.  Lymphadenopathy:    She has no cervical adenopathy.  Neurological: She is alert and oriented to person, place, and time. She has normal reflexes.  Skin: Skin is warm and dry.  Psychiatric: She has a normal mood and affect. Her behavior is normal. Judgment and thought content normal.     Lab Results  Component Value Date   WBC 6.3 12/16/2008   HGB 14.6 12/16/2008   HCT 41.8 12/16/2008   PLT 257 12/16/2008    Nm Gastric Emptying  Result Date: 09/30/2013 CLINICAL DATA:  History of nausea, vomiting, and reflux. EXAM: NUCLEAR MEDICINE GASTRIC EMPTYING SCAN TECHNIQUE: After oral ingestion of radiolabeled meal, sequential abdominal images were  obtained for 120 minutes. Residual percentage of activity remaining within the stomach was calculated at 60 and 120 minutes. COMPARISON:  None. RADIOPHARMACEUTICALS:  2technetium of 16m labeled sulfur colloid FINDINGS: Expected location of the stomach in the left upper quadrant. Ingested meal empties the stomach gradually over the course of the study with 62 % retention at 60 min and 23 % retention at 120 min (normal retention less than  30% at a 120 min). IMPRESSION: Normal gastric emptying study. Electronically Signed   By: Onalee Hua  Call M.D.   On: 09/30/2013 09:18    Assessment & Plan:   Mega was seen today for asthma.  Diagnoses and all orders for this visit:  Cough -     PR BREATHING CAPACITY TEST -     betamethasone acetate-betamethasone sodium phosphate (CELESTONE) injection 6 mg; Inject 1 mL (6 mg total) into the muscle once.  Mild intermittent asthma with exacerbation -     betamethasone acetate-betamethasone sodium phosphate (CELESTONE) injection 6 mg; Inject 1 mL (6 mg total) into the muscle once.  Other orders -     albuterol (PROVENTIL HFA;VENTOLIN HFA) 108 (90 Base) MCG/ACT inhaler; Inhale 2 puffs into the lungs every 6 (six) hours as needed for wheezing or shortness of breath. -     azithromycin (ZITHROMAX Z-PAK) 250 MG tablet; Take two right away Then one a day for the next 4 days.    I have discontinued Ms. Rudnick's levofloxacin and benzonatate. I am also having her start on albuterol and azithromycin. Additionally, I am having her maintain her etanercept, naproxen, cetirizine, fluticasone, leflunomide, and umeclidinium-vilanterol. We will continue to administer betamethasone acetate-betamethasone sodium phosphate.  Meds ordered this encounter  Medications  . betamethasone acetate-betamethasone sodium phosphate (CELESTONE) injection 6 mg  . albuterol (PROVENTIL HFA;VENTOLIN HFA) 108 (90 Base) MCG/ACT inhaler    Sig: Inhale 2 puffs into the lungs every 6 (six) hours as needed for wheezing or shortness of breath.    Dispense:  1 Inhaler    Refill:  11  . azithromycin (ZITHROMAX Z-PAK) 250 MG tablet    Sig: Take two right away Then one a day for the next 4 days.    Dispense:  6 each    Refill:  0     Follow-up: No Follow-up on file.  Mechele Claude, M.D.

## 2016-08-06 DIAGNOSIS — Z79899 Other long term (current) drug therapy: Secondary | ICD-10-CM | POA: Diagnosis not present

## 2016-08-06 DIAGNOSIS — M0579 Rheumatoid arthritis with rheumatoid factor of multiple sites without organ or systems involvement: Secondary | ICD-10-CM | POA: Diagnosis not present

## 2016-08-06 DIAGNOSIS — M170 Bilateral primary osteoarthritis of knee: Secondary | ICD-10-CM | POA: Diagnosis not present

## 2016-08-21 ENCOUNTER — Telehealth: Payer: Self-pay | Admitting: Family Medicine

## 2016-08-21 NOTE — Telephone Encounter (Signed)
Patient is still having cough and feels like fluid in her right ear.  Patient made an appointment to be seen on 08/24/16 with Dr. Darlyn Read.

## 2016-08-24 ENCOUNTER — Ambulatory Visit: Payer: Federal, State, Local not specified - PPO | Admitting: Family Medicine

## 2016-08-28 ENCOUNTER — Ambulatory Visit: Payer: Federal, State, Local not specified - PPO | Admitting: Family Medicine

## 2016-08-28 ENCOUNTER — Encounter: Payer: Self-pay | Admitting: Family Medicine

## 2016-08-28 ENCOUNTER — Ambulatory Visit (INDEPENDENT_AMBULATORY_CARE_PROVIDER_SITE_OTHER): Payer: Federal, State, Local not specified - PPO | Admitting: Family Medicine

## 2016-08-28 VITALS — BP 132/81 | HR 85 | Temp 97.9°F | Ht 66.0 in | Wt 217.5 lb

## 2016-08-28 DIAGNOSIS — R059 Cough, unspecified: Secondary | ICD-10-CM

## 2016-08-28 DIAGNOSIS — R05 Cough: Secondary | ICD-10-CM | POA: Diagnosis not present

## 2016-08-28 MED ORDER — RANITIDINE HCL 150 MG PO TABS: 150.0000 mg | ORAL_TABLET | Freq: Two times a day (BID) | ORAL | 2 refills | Status: DC

## 2016-08-28 NOTE — Progress Notes (Signed)
BP 132/81   Pulse 85   Temp 97.9 F (36.6 C) (Oral)   Ht 5\' 6"  (1.676 m)   Wt 217 lb 8 oz (98.7 kg)   BMI 35.11 kg/m    Subjective:    Patient ID: Jean Hernandez, female    DOB: 1971-03-22, 45 y.o.   MRN: [id number]  HPI: Jean Hernandez is a 45 y.o. female presenting on 08/28/2016 for Cough (not any better; read recent article she read that states 47% of RA patients also have chronic bronchitis) and Wheezing   HPI Persistent cough Patient has been having persistent cough that she feels like she has been unable to get rid of over the past 5 months. She has been treated on a couple different times with steroids and antibiotics for bronchitis and she has an albuterol inhaler. She feels like that has not improved her cough much or at always comes back after the treatment. Her cough is worse at night and sometimes she coughs to the point that she has posttussive emesis. She also has some issues in the early morning with this as well. She denies any abdominal pain or chest pain or burning. The cough is mostly nonproductive except for when she has to vomit. She thinks she may have had some wheezing as well but does not know for sure. She is on DMARD's for rheumatoid arthritis. She is concerned about rheumatoid arthritis or the DMARD being involved in possible lung disease. She denies any fevers or chills or shortness of breath. She has put out a call to her rheumatologist and plans to go see her.  Relevant past medical, surgical, family and social history reviewed and updated as indicated. Interim medical history since our last visit reviewed. Allergies and medications reviewed and updated.  Review of Systems  Constitutional: Negative for chills and fever.  HENT: Negative for congestion, ear discharge, ear pain, sinus pressure, sneezing and sore throat.   Eyes: Negative for redness and visual disturbance.  Respiratory: Positive for cough and wheezing. Negative for chest tightness and shortness of  breath.   Cardiovascular: Negative for chest pain and leg swelling.  Gastrointestinal: Positive for vomiting. Negative for abdominal pain and nausea.  Genitourinary: Negative for difficulty urinating and dysuria.  Musculoskeletal: Negative for back pain and gait problem.  Skin: Negative for rash.  Neurological: Negative for light-headedness and headaches.  Psychiatric/Behavioral: Negative for agitation and behavioral problems.  All other systems reviewed and are negative.   Per HPI unless specifically indicated above     Medication List       Accurate as of 08/28/16  8:55 AM. Always use your most recent med list.          albuterol 108 (90 Base) MCG/ACT inhaler Commonly known as:  PROVENTIL HFA;VENTOLIN HFA Inhale 2 puffs into the lungs every 6 (six) hours as needed for wheezing or shortness of breath.   cetirizine 10 MG tablet Commonly known as:  ZYRTEC Take 10 mg by mouth daily.   etanercept 25 MG injection Commonly known as:  ENBREL Inject 50 mg into the skin once a week.   fluticasone 50 MCG/ACT nasal spray Commonly known as:  FLONASE Place into both nostrils daily.   leflunomide 20 MG tablet Commonly known as:  ARAVA Take 20 mg by mouth daily.   naproxen 500 MG tablet Commonly known as:  NAPROSYN Take 500 mg by mouth as needed.   ranitidine 150 MG tablet Commonly known as:  ZANTAC Take 1 tablet (  150 mg total) by mouth 2 (two) times daily.   umeclidinium-vilanterol 62.5-25 MCG/INH Aepb Commonly known as:  ANORO ELLIPTA One puff daily          Objective:    BP 132/81   Pulse 85   Temp 97.9 F (36.6 C) (Oral)   Ht 5\' 6"  (1.676 m)   Wt 217 lb 8 oz (98.7 kg)   BMI 35.11 kg/m   Wt Readings from Last 3 Encounters:  08/28/16 217 lb 8 oz (98.7 kg)  08/01/16 217 lb 8 oz (98.7 kg)  06/19/16 217 lb 6 oz (98.6 kg)    Physical Exam  Constitutional: She is oriented to person, place, and time. She appears well-developed and well-nourished. No distress.    HENT:  Right Ear: External ear normal.  Left Ear: External ear normal.  Nose: Nose normal.  Mouth/Throat: Oropharynx is clear and moist. No oropharyngeal exudate.  Eyes: Conjunctivae are normal.  Neck: Neck supple. No thyromegaly present.  Cardiovascular: Normal rate, regular rhythm, normal heart sounds and intact distal pulses.   No murmur heard. Pulmonary/Chest: Effort normal and breath sounds normal. No respiratory distress. She has no wheezes. She has no rales.  Abdominal: Soft. Bowel sounds are normal. She exhibits no distension. There is no tenderness. There is no rebound and no guarding.  Musculoskeletal: Normal range of motion. She exhibits no edema or tenderness.  Lymphadenopathy:    She has no cervical adenopathy.  Neurological: She is alert and oriented to person, place, and time. Coordination normal.  Skin: Skin is warm and dry. No rash noted. She is not diaphoretic.  Psychiatric: She has a normal mood and affect. Her behavior is normal.  Nursing note and vitals reviewed.     Assessment & Plan:   Problem List Items Addressed This Visit    None    Visit Diagnoses    Cough    -  Primary   Persistent for the past 5 months, has had chest x-ray was normal and treated for bronchitis twice. Will try GERD med and possible pulmonary referral future   Relevant Medications   ranitidine (ZANTAC) 150 MG tablet       Follow up plan: Return if symptoms worsen or fail to improve.  Counseling provided for all of the vaccine components No orders of the defined types were placed in this encounter.   08/19/16, MD [facility] 08/28/2016, 8:55 AM

## 2016-09-24 ENCOUNTER — Telehealth: Payer: Self-pay | Admitting: Family Medicine

## 2016-09-24 ENCOUNTER — Telehealth: Payer: Self-pay | Admitting: Family

## 2016-09-24 ENCOUNTER — Ambulatory Visit (INDEPENDENT_AMBULATORY_CARE_PROVIDER_SITE_OTHER): Payer: Federal, State, Local not specified - PPO | Admitting: Family

## 2016-09-24 ENCOUNTER — Encounter: Payer: Self-pay | Admitting: Family

## 2016-09-24 VITALS — BP 118/83 | HR 108 | Temp 98.5°F | Ht 66.0 in | Wt 216.6 lb

## 2016-09-24 DIAGNOSIS — J209 Acute bronchitis, unspecified: Secondary | ICD-10-CM | POA: Diagnosis not present

## 2016-09-24 MED ORDER — AZITHROMYCIN 250 MG PO TABS: ORAL_TABLET | ORAL | 0 refills | Status: DC

## 2016-09-24 MED ORDER — GUAIFENESIN 200 MG PO TABS: 200.0000 mg | ORAL_TABLET | ORAL | 0 refills | Status: DC | PRN

## 2016-09-24 MED ORDER — BENZONATATE 200 MG PO CAPS: 200.0000 mg | ORAL_CAPSULE | Freq: Three times a day (TID) | ORAL | 1 refills | Status: DC | PRN

## 2016-09-24 MED ORDER — FLUTICASONE PROPIONATE 50 MCG/ACT NA SUSP: 2.0000 | Freq: Every day | NASAL | 2 refills | Status: DC

## 2016-09-24 NOTE — Progress Notes (Signed)
Subjective:    Patient ID: Jean Hernandez, female    DOB: Mar 24, 1971, 45 y.o.   MRN: [id number]  Cough  This is a new problem. The current episode started in the past 7 days. The problem has been gradually worsening. The problem occurs every few minutes. The cough is productive of sputum. Associated symptoms include ear congestion, ear pain (right), headaches, myalgias, nasal congestion, postnasal drip, rhinorrhea, a sore throat, shortness of breath and wheezing. Pertinent negatives include no chills or fever. The symptoms are aggravated by lying down. She has tried rest and OTC cough suppressant for the symptoms. The treatment provided mild relief. Her past medical history is significant for asthma. There is no history of COPD.      Review of Systems  Constitutional: Negative for chills and fever.  HENT: Positive for ear pain (right), postnasal drip, rhinorrhea and sore throat.   Respiratory: Positive for cough, shortness of breath and wheezing.   Musculoskeletal: Positive for myalgias.  Neurological: Positive for headaches.  All other systems reviewed and are negative.      Objective:   Physical Exam  Constitutional: She is oriented to person, place, and time. She appears well-developed and well-nourished. No distress.  HENT:  Head: Normocephalic and atraumatic.  Right Ear: External ear normal. Tympanic membrane is erythematous.  Left Ear: Tympanic membrane is erythematous.  Nose: Mucosal edema and rhinorrhea present.  Mouth/Throat: Posterior oropharyngeal erythema present.  Eyes: Pupils are equal, round, and reactive to light.  Neck: Normal range of motion. Neck supple. No thyromegaly present.  Cardiovascular: Normal rate, regular rhythm, normal heart sounds and intact distal pulses.   No murmur heard. Pulmonary/Chest: Effort normal and breath sounds normal. No respiratory distress. She has no wheezes.  Intermittent coarse cough   Abdominal: Soft. Bowel sounds are normal. She  exhibits no distension. There is no tenderness.  Musculoskeletal: Normal range of motion. She exhibits no edema or tenderness.  Neurological: She is alert and oriented to person, place, and time. She has normal reflexes. No cranial nerve deficit.  Skin: Skin is warm and dry.  Psychiatric: She has a normal mood and affect. Her behavior is normal. Judgment and thought content normal.  Vitals reviewed.     BP 118/83   Pulse (!) 108   Temp 98.5 F (36.9 C) (Oral)   Ht 5\' 6"  (1.676 m)   Wt 216 lb 9.6 oz (98.2 kg)   BMI 34.96 kg/m      Assessment & Plan:  1. Acute bronchitis, unspecified organism - Take meds as prescribed - Use a cool mist humidifier  -Use saline nose sprays frequently -Saline irrigations of the nose can be very helpful if done frequently.  * 4X daily for 1 week*  * Use of a nettie pot can be helpful with this. Follow directions with this* -Force fluids -For any cough or congestion  Use plain Mucinex- regular strength or max strength is fine   * Children- consult with Pharmacist for dosing -For fever or aces or pains- take tylenol or ibuprofen appropriate for age and weight.  * for fevers greater than 101 orally you may alternate ibuprofen and tylenol every  3 hours. -Throat lozenges if help - azithromycin (ZITHROMAX) 250 MG tablet; Take 500 mg once, then 250 mg for four days  Dispense: 6 tablet; Refill: 0 - fluticasone (FLONASE) 50 MCG/ACT nasal spray; Place 2 sprays into both nostrils daily.  Dispense: 16 g; Refill: 2 - benzonatate (TESSALON) 200 MG capsule; Take 1  capsule (200 mg total) by mouth 3 (three) times daily as needed.  Dispense: 30 capsule; Refill: 1  Jannifer Rodney, FNP

## 2016-09-24 NOTE — Patient Instructions (Signed)

## 2016-09-24 NOTE — Telephone Encounter (Signed)
Guaifenesin Prescription sent to pharmacy

## 2016-09-25 ENCOUNTER — Ambulatory Visit (INDEPENDENT_AMBULATORY_CARE_PROVIDER_SITE_OTHER): Payer: Federal, State, Local not specified - PPO | Admitting: Family Medicine

## 2016-09-25 ENCOUNTER — Encounter: Payer: Self-pay | Admitting: Family Medicine

## 2016-09-25 VITALS — BP 123/84 | HR 113 | Temp 102.0°F | Ht 66.0 in | Wt 215.5 lb

## 2016-09-25 DIAGNOSIS — R6889 Other general symptoms and signs: Secondary | ICD-10-CM | POA: Diagnosis not present

## 2016-09-25 LAB — VERITOR FLU A/B WAIVED
INFLUENZA B: NEGATIVE
Influenza A: POSITIVE — AB

## 2016-09-25 MED ORDER — OSELTAMIVIR PHOSPHATE 75 MG PO CAPS: 75.0000 mg | ORAL_CAPSULE | Freq: Two times a day (BID) | ORAL | 0 refills | Status: DC

## 2016-09-25 NOTE — Progress Notes (Signed)
BP 123/84   Pulse (!) 113   Temp (!) 102 F (38.9 C) (Oral)   Ht 5\' 6"  (1.676 m)   Wt 215 lb 8 oz (97.8 kg)   SpO2 94%   BMI 34.78 kg/m    Subjective:    Patient ID: , female    DOB: 03/07/1971, 45 y.o.   MRN: [id number]  HPI: Jean Hernandez is a 45 y.o. female presenting on 09/25/2016 for Chills, headache, body aches, cough, congestion (seen yesterday and diagnosed with bronchitis, started on Zpak, Tessalon Perles and Flonase)   HPI Fever and chills and aches Patient was seen yesterday for cough and congestion runny nose but then overnight she developed a lot more fevers and chills and aches. She denies any shortness of breath or wheezing. She started on the Z-Pak yesterday and Tessalon Perles and Flonase and feels like the 14/09/2016 are not helping as much. The aches and the fever just got worse overnight.  Relevant past medical, surgical, family and social history reviewed and updated as indicated. Interim medical history since our last visit reviewed. Allergies and medications reviewed and updated.  Review of Systems  Constitutional: Positive for chills and fever.  HENT: Positive for congestion, postnasal drip, rhinorrhea, sinus pressure, sneezing and sore throat. Negative for ear discharge and ear pain.   Eyes: Negative for pain, redness and visual disturbance.  Respiratory: Positive for cough. Negative for chest tightness and shortness of breath.   Cardiovascular: Negative for chest pain and leg swelling.  Genitourinary: Negative for difficulty urinating and dysuria.  Musculoskeletal: Positive for myalgias. Negative for back pain and gait problem.  Skin: Negative for rash.  Neurological: Negative for light-headedness and headaches.  Psychiatric/Behavioral: Negative for agitation and behavioral problems.  All other systems reviewed and are negative.   Per HPI unless specifically indicated above      Objective:    BP 123/84   Pulse (!) 113   Temp  (!) 102 F (38.9 C) (Oral)   Ht 5\' 6"  (1.676 m)   Wt 215 lb 8 oz (97.8 kg)   SpO2 94%   BMI 34.78 kg/m   Wt Readings from Last 3 Encounters:  09/25/16 215 lb 8 oz (97.8 kg)  09/24/16 216 lb 9.6 oz (98.2 kg)  08/28/16 217 lb 8 oz (98.7 kg)    Physical Exam  Constitutional: She is oriented to person, place, and time. She appears well-developed and well-nourished. No distress.  HENT:  Right Ear: Tympanic membrane, external ear and ear canal normal.  Left Ear: Tympanic membrane, external ear and ear canal normal.  Nose: Mucosal edema and rhinorrhea present. No epistaxis. Right sinus exhibits no maxillary sinus tenderness and no frontal sinus tenderness. Left sinus exhibits no maxillary sinus tenderness and no frontal sinus tenderness.  Mouth/Throat: Uvula is midline and mucous membranes are normal. Posterior oropharyngeal edema and posterior oropharyngeal erythema present. No oropharyngeal exudate or tonsillar abscesses.  Eyes: Conjunctivae are normal.  Cardiovascular: Normal rate, regular rhythm, normal heart sounds and intact distal pulses.   No murmur heard. Pulmonary/Chest: Effort normal and breath sounds normal. No respiratory distress. She has no wheezes. She has no rales.  Musculoskeletal: Normal range of motion. She exhibits no edema or tenderness.  Neurological: She is alert and oriented to person, place, and time. Coordination normal.  Skin: Skin is warm and dry. No rash noted. She is not diaphoretic.  Psychiatric: She has a normal mood and affect. Her behavior is normal.  Vitals  reviewed.     Influenza A positive  Assessment & Plan:   Problem List Items Addressed This Visit    None    Visit Diagnoses    Flu-like symptoms    -  Primary   Relevant Medications   oseltamivir (TAMIFLU) 75 MG capsule   Other Relevant Orders   Veritor Flu A/B Waived       Follow up plan: Return if symptoms worsen or fail to improve.  Counseling provided for all of the vaccine  components Orders Placed This Encounter  Procedures  . Veritor Flu A/B Waived    Arville Care, MD [facility] 09/25/2016, 8:58 AM

## 2016-09-27 NOTE — Telephone Encounter (Signed)
Patient seen by provider on 09-25-16.

## 2016-09-29 ENCOUNTER — Ambulatory Visit (INDEPENDENT_AMBULATORY_CARE_PROVIDER_SITE_OTHER): Payer: Federal, State, Local not specified - PPO | Admitting: Physician Assistant

## 2016-09-29 VITALS — BP 120/80 | HR 93 | Temp 98.7°F | Ht 66.0 in | Wt 217.0 lb

## 2016-09-29 DIAGNOSIS — J0100 Acute maxillary sinusitis, unspecified: Secondary | ICD-10-CM

## 2016-09-29 DIAGNOSIS — J209 Acute bronchitis, unspecified: Secondary | ICD-10-CM

## 2016-09-29 MED ORDER — CEFDINIR 300 MG PO CAPS: 300.0000 mg | ORAL_CAPSULE | Freq: Two times a day (BID) | ORAL | 0 refills | Status: DC

## 2016-09-29 MED ORDER — METHYLPREDNISOLONE ACETATE 80 MG/ML IJ SUSP
80.0000 mg | Freq: Once | INTRAMUSCULAR | Status: AC
  Administered 2016-09-29: 80 mg via INTRAMUSCULAR

## 2016-09-29 NOTE — Patient Instructions (Signed)

## 2016-09-29 NOTE — Progress Notes (Signed)
BP 120/80 (BP Location: Left Wrist, Patient Position: Sitting, Cuff Size: Normal)   Pulse 93   Temp 98.7 F (37.1 C) (Oral)   Ht 5\' 6"  (1.676 m)   Wt 217 lb (98.4 kg)   BMI 35.02 kg/m    Subjective:    Patient ID: , female    DOB: 12/07/1970, 45 y.o.   MRN: [id number]  HPI: Jean Hernandez is a 45 y.o. female presenting on 09/29/2016 for Wheezing (wheezing and crackling in upper airway on expiration. Being treated for bronchitis and the flu. Taking Mucinex, Zpak, and Tamiflu. Using Albuterol twice daily. ) and Sinusitis (continues to have nasal congestion and ear pressure) Patient has completed her Zithromax. She did have a positive test for influenza earlier in the week. She is on her final day of Tamiflu. She is having significant cough and wheeze. Also very bad head pressure. She denies any nausea vomiting or diarrhea.   Relevant past medical, surgical, family and social history reviewed and updated as indicated. Allergies and medications reviewed and updated.  Past Medical History:  Diagnosis Date  . Rheumatoid arthritis [facility])     Past Surgical History:  Procedure Laterality Date  . ABDOMINAL HYSTERECTOMY    . C sections     x2  . colonsocopy  2012    Review of Systems  Constitutional: Positive for chills and fatigue. Negative for activity change and appetite change.  HENT: Positive for congestion, postnasal drip, sinus pain, sinus pressure and sore throat.   Eyes: Negative.   Respiratory: Positive for cough and wheezing.   Cardiovascular: Negative.  Negative for chest pain, palpitations and leg swelling.  Gastrointestinal: Negative.   Genitourinary: Negative.   Musculoskeletal: Negative.   Skin: Negative.   Neurological: Positive for headaches.    Allergies as of 09/29/2016      Reactions   Hydrocodone-acetaminophen Itching, Rash   Other Itching   Dilaudid   Oxycodone-acetaminophen Itching, Rash   Ultracet [tramadol-acetaminophen] Itching, Rash   REACTION: itching   Amoxicillin    REACTION: hives   Methotrexate Derivatives Other (See Comments)   alopecia   Propoxyphene N-acetaminophen    REACTION: itching      Medication List       Accurate as of 09/29/16  9:12 AM. Always use your most recent med list.          albuterol 108 (90 Base) MCG/ACT inhaler Commonly known as:  PROVENTIL HFA;VENTOLIN HFA Inhale 2 puffs into the lungs every 6 (six) hours as needed for wheezing or shortness of breath.   benzonatate 200 MG capsule Commonly known as:  TESSALON Take 1 capsule (200 mg total) by mouth 3 (three) times daily as needed.   cefdinir 300 MG capsule Commonly known as:  OMNICEF Take 1 capsule (300 mg total) by mouth 2 (two) times daily. 1 po BID   cetirizine 10 MG tablet Commonly known as:  ZYRTEC Take 10 mg by mouth daily.   etanercept 25 MG injection Commonly known as:  ENBREL Inject 50 mg into the skin once a week.   fluticasone 50 MCG/ACT nasal spray Commonly known as:  FLONASE Place 2 sprays into both nostrils daily.   guaiFENesin 200 MG tablet Take 1 tablet (200 mg total) by mouth every 4 (four) hours as needed for cough or to loosen phlegm.   leflunomide 20 MG tablet Commonly known as:  ARAVA Take 20 mg by mouth daily.   naproxen 500 MG tablet Commonly known as:  NAPROSYN Take 500 mg by mouth as needed.   oseltamivir 75 MG capsule Commonly known as:  TAMIFLU Take 1 capsule (75 mg total) by mouth 2 (two) times daily.   ranitidine 150 MG tablet Commonly known as:  ZANTAC Take 1 tablet (150 mg total) by mouth 2 (two) times daily.   umeclidinium-vilanterol 62.5-25 MCG/INH Aepb Commonly known as:  ANORO ELLIPTA One puff daily          Objective:    BP 120/80 (BP Location: Left Wrist, Patient Position: Sitting, Cuff Size: Normal)   Pulse 93   Temp 98.7 F (37.1 C) (Oral)   Ht 5\' 6"  (1.676 m)   Wt 217 lb (98.4 kg)   BMI 35.02 kg/m   Allergies  Allergen Reactions  .  Hydrocodone-Acetaminophen Itching and Rash  . Other Itching    Dilaudid  . Oxycodone-Acetaminophen Itching and Rash  . Ultracet [Tramadol-Acetaminophen] Itching and Rash    REACTION: itching  . Amoxicillin     REACTION: hives  . Methotrexate Derivatives Other (See Comments)    alopecia  . Propoxyphene N-Acetaminophen     REACTION: itching    Physical Exam  Constitutional: She is oriented to person, place, and time. She appears well-developed and well-nourished.  HENT:  Head: Normocephalic and atraumatic.  Right Ear: There is drainage and tenderness.  Left Ear: There is drainage and tenderness.  Nose: Mucosal edema and rhinorrhea present. Right sinus exhibits maxillary sinus tenderness and frontal sinus tenderness. Left sinus exhibits maxillary sinus tenderness and frontal sinus tenderness.  Mouth/Throat: Oropharyngeal exudate and posterior oropharyngeal erythema present.  Eyes: Conjunctivae and EOM are normal. Pupils are equal, round, and reactive to light.  Neck: Normal range of motion. Neck supple.  Cardiovascular: Normal rate, regular rhythm, normal heart sounds and intact distal pulses.   Pulmonary/Chest: Effort normal. She has wheezes in the right upper field and the left upper field.  Abdominal: Soft. Bowel sounds are normal.  Neurological: She is alert and oriented to person, place, and time. She has normal reflexes.  Skin: Skin is warm and dry. No rash noted.  Psychiatric: She has a normal mood and affect. Her behavior is normal. Judgment and thought content normal.    Results for orders placed or performed in visit on 09/25/16  Veritor Flu A/B Waived  Result Value Ref Range   Influenza A Positive (A) Negative   Influenza B Negative Negative      Assessment & Plan:   1. Acute bronchitis, unspecified organism - cefdinir (OMNICEF) 300 MG capsule; Take 1 capsule (300 mg total) by mouth 2 (two) times daily. 1 po BID  Dispense: 20 capsule; Refill: 0 - methylPREDNISolone  acetate (DEPO-MEDROL) injection 80 mg; Inject 1 mL (80 mg total) into the muscle once.  2. Acute non-recurrent maxillary sinusitis   Continue all other maintenance medications as listed above.  Follow up plan: Return if symptoms worsen or fail to improve.  Educational handout given for bronchitis  14/12/17 PA-C [facility] [address]  [place], [place] [phone]   09/29/2016, 9:12 AM

## 2016-10-01 ENCOUNTER — Telehealth: Payer: Self-pay | Admitting: Physician Assistant

## 2016-10-01 DIAGNOSIS — Z885 Allergy status to narcotic agent status: Secondary | ICD-10-CM | POA: Diagnosis not present

## 2016-10-01 DIAGNOSIS — J111 Influenza due to unidentified influenza virus with other respiratory manifestations: Secondary | ICD-10-CM | POA: Diagnosis not present

## 2016-10-01 DIAGNOSIS — Z880 Allergy status to penicillin: Secondary | ICD-10-CM | POA: Diagnosis not present

## 2016-10-01 DIAGNOSIS — R7989 Other specified abnormal findings of blood chemistry: Secondary | ICD-10-CM | POA: Diagnosis not present

## 2016-10-01 DIAGNOSIS — Z888 Allergy status to other drugs, medicaments and biological substances status: Secondary | ICD-10-CM | POA: Diagnosis not present

## 2016-10-01 DIAGNOSIS — R05 Cough: Secondary | ICD-10-CM | POA: Diagnosis not present

## 2016-10-01 DIAGNOSIS — Z79899 Other long term (current) drug therapy: Secondary | ICD-10-CM | POA: Diagnosis not present

## 2016-10-01 DIAGNOSIS — M069 Rheumatoid arthritis, unspecified: Secondary | ICD-10-CM | POA: Diagnosis not present

## 2016-10-01 DIAGNOSIS — Z8709 Personal history of other diseases of the respiratory system: Secondary | ICD-10-CM | POA: Diagnosis not present

## 2016-10-01 DIAGNOSIS — I10 Essential (primary) hypertension: Secondary | ICD-10-CM | POA: Diagnosis not present

## 2016-10-01 DIAGNOSIS — R630 Anorexia: Secondary | ICD-10-CM | POA: Diagnosis not present

## 2016-10-01 DIAGNOSIS — H9209 Otalgia, unspecified ear: Secondary | ICD-10-CM | POA: Diagnosis not present

## 2016-10-01 DIAGNOSIS — H9201 Otalgia, right ear: Secondary | ICD-10-CM | POA: Diagnosis not present

## 2016-10-02 NOTE — Telephone Encounter (Signed)
Please contact the patient - the symptom she is describing was not addressed during her recent visits. I can see her later in the week or she may want to call her rheumatologist.

## 2016-10-02 NOTE — Telephone Encounter (Signed)
Follow up.

## 2016-10-19 DIAGNOSIS — M0579 Rheumatoid arthritis with rheumatoid factor of multiple sites without organ or systems involvement: Secondary | ICD-10-CM | POA: Diagnosis not present

## 2016-10-19 DIAGNOSIS — M2550 Pain in unspecified joint: Secondary | ICD-10-CM | POA: Diagnosis not present

## 2016-10-19 DIAGNOSIS — M170 Bilateral primary osteoarthritis of knee: Secondary | ICD-10-CM | POA: Diagnosis not present

## 2016-11-05 ENCOUNTER — Ambulatory Visit: Payer: Federal, State, Local not specified - PPO | Admitting: Neurology

## 2016-11-27 ENCOUNTER — Telehealth: Payer: Self-pay

## 2016-11-27 DIAGNOSIS — R1110 Vomiting, unspecified: Secondary | ICD-10-CM

## 2016-11-27 DIAGNOSIS — G8929 Other chronic pain: Secondary | ICD-10-CM

## 2016-11-27 DIAGNOSIS — H9209 Otalgia, unspecified ear: Principal | ICD-10-CM

## 2016-11-27 NOTE — Telephone Encounter (Signed)
Refer as requested

## 2016-11-27 NOTE — Telephone Encounter (Signed)
Referrals placed and pt aware it could take up to 2 weeks for referrals.

## 2016-12-27 DIAGNOSIS — H6121 Impacted cerumen, right ear: Secondary | ICD-10-CM | POA: Diagnosis not present

## 2016-12-27 DIAGNOSIS — K219 Gastro-esophageal reflux disease without esophagitis: Secondary | ICD-10-CM | POA: Diagnosis not present

## 2016-12-27 DIAGNOSIS — R05 Cough: Secondary | ICD-10-CM | POA: Diagnosis not present

## 2016-12-27 DIAGNOSIS — H938X1 Other specified disorders of right ear: Secondary | ICD-10-CM | POA: Diagnosis not present

## 2017-01-17 DIAGNOSIS — M0579 Rheumatoid arthritis with rheumatoid factor of multiple sites without organ or systems involvement: Secondary | ICD-10-CM | POA: Diagnosis not present

## 2017-01-17 DIAGNOSIS — Z79899 Other long term (current) drug therapy: Secondary | ICD-10-CM | POA: Diagnosis not present

## 2017-01-17 DIAGNOSIS — M2550 Pain in unspecified joint: Secondary | ICD-10-CM | POA: Diagnosis not present

## 2017-01-17 DIAGNOSIS — M170 Bilateral primary osteoarthritis of knee: Secondary | ICD-10-CM | POA: Diagnosis not present

## 2017-01-19 DIAGNOSIS — H40033 Anatomical narrow angle, bilateral: Secondary | ICD-10-CM | POA: Diagnosis not present

## 2017-01-19 DIAGNOSIS — H1013 Acute atopic conjunctivitis, bilateral: Secondary | ICD-10-CM | POA: Diagnosis not present

## 2017-02-12 DIAGNOSIS — M0689 Other specified rheumatoid arthritis, multiple sites: Secondary | ICD-10-CM | POA: Diagnosis not present

## 2017-02-18 ENCOUNTER — Other Ambulatory Visit: Payer: Self-pay | Admitting: Gastroenterology

## 2017-02-18 DIAGNOSIS — R1110 Vomiting, unspecified: Secondary | ICD-10-CM | POA: Diagnosis not present

## 2017-02-18 DIAGNOSIS — R197 Diarrhea, unspecified: Secondary | ICD-10-CM | POA: Diagnosis not present

## 2017-02-18 DIAGNOSIS — R140 Abdominal distension (gaseous): Secondary | ICD-10-CM | POA: Diagnosis not present

## 2017-02-18 DIAGNOSIS — R142 Eructation: Secondary | ICD-10-CM | POA: Diagnosis not present

## 2017-02-21 ENCOUNTER — Ambulatory Visit: Payer: Federal, State, Local not specified - PPO | Admitting: Pediatrics

## 2017-02-21 DIAGNOSIS — N958 Other specified menopausal and perimenopausal disorders: Secondary | ICD-10-CM | POA: Diagnosis not present

## 2017-02-21 DIAGNOSIS — M816 Localized osteoporosis [Lequesne]: Secondary | ICD-10-CM | POA: Diagnosis not present

## 2017-02-21 DIAGNOSIS — Z13820 Encounter for screening for osteoporosis: Secondary | ICD-10-CM | POA: Diagnosis not present

## 2017-02-21 DIAGNOSIS — Z6836 Body mass index (BMI) 36.0-36.9, adult: Secondary | ICD-10-CM | POA: Diagnosis not present

## 2017-02-21 DIAGNOSIS — Z01419 Encounter for gynecological examination (general) (routine) without abnormal findings: Secondary | ICD-10-CM | POA: Diagnosis not present

## 2017-02-21 DIAGNOSIS — Z1231 Encounter for screening mammogram for malignant neoplasm of breast: Secondary | ICD-10-CM | POA: Diagnosis not present

## 2017-02-22 ENCOUNTER — Ambulatory Visit: Payer: Federal, State, Local not specified - PPO

## 2017-02-25 ENCOUNTER — Encounter: Payer: Self-pay | Admitting: Family Medicine

## 2017-03-22 ENCOUNTER — Encounter (HOSPITAL_COMMUNITY): Admission: RE | Payer: Self-pay | Source: Ambulatory Visit

## 2017-03-22 ENCOUNTER — Ambulatory Visit (HOSPITAL_COMMUNITY)
Admission: RE | Admit: 2017-03-22 | Payer: Federal, State, Local not specified - PPO | Source: Ambulatory Visit | Admitting: Gastroenterology

## 2017-03-22 SURGERY — MANOMETRY, ESOPHAGUS

## 2017-04-01 ENCOUNTER — Ambulatory Visit (INDEPENDENT_AMBULATORY_CARE_PROVIDER_SITE_OTHER): Payer: Federal, State, Local not specified - PPO | Admitting: Family Medicine

## 2017-04-01 ENCOUNTER — Encounter: Payer: Self-pay | Admitting: Family Medicine

## 2017-04-01 VITALS — BP 114/76 | HR 91 | Temp 97.9°F | Ht 66.0 in | Wt 222.6 lb

## 2017-04-01 DIAGNOSIS — M5431 Sciatica, right side: Secondary | ICD-10-CM | POA: Diagnosis not present

## 2017-04-01 MED ORDER — CYCLOBENZAPRINE HCL 10 MG PO TABS: 10.0000 mg | ORAL_TABLET | Freq: Three times a day (TID) | ORAL | 0 refills | Status: DC | PRN

## 2017-04-01 MED ORDER — PREDNISONE 20 MG PO TABS: 20.0000 mg | ORAL_TABLET | Freq: Every day | ORAL | 0 refills | Status: DC

## 2017-04-01 NOTE — Progress Notes (Signed)
   HPI  Patient presents today here with back pain.  Patient explains that yesterday she bent over causing acute right lower back pain. She has had mild radiation down the posterior right leg. She's previously had similar pain but not this severe. She declined milligrams of prednisone without much improvement, also 500 mg and naproxen without much improvement.  She denies any weakness in the leg, difficulty walking.  PMH: Smoking status noted ROS: Per HPI  Objective: BP 114/76   Pulse 91   Temp 97.9 F (36.6 C) (Oral)   Ht 5\' 6"  (1.676 m)   Wt 222 lb 9.6 oz (101 kg)   BMI 35.93 kg/m  Gen: NAD, alert, cooperative with exam HEENT: NCAT CV: RRR, good S1/S2, no murmur Resp: CTABL, no wheezes, non-labored Abd: SNTND, BS present, no guarding or organomegaly Ext: No edema, warm Neuro: Alert and oriented, strength 5/5 in bilateral lower extremities, 2+ patellar tendon reflexes bilaterally, negative straight leg raise-modified MSK Tenderness to palpation of her right lower back over right-sided paraspinal muscles and lumbar area  Assessment and plan:  # Sciatica of the right side Treat with prednisone plus Flexeril No need for imaging tonight No red flags Low threshold for follow-up of needed  Patient states - Oh by the way she had chest pain a few weeks ago, this has resolved. She states that her dad passed away from cardiovascular disease/CAD at the age of 71. She will come back in the next 1-2 weeks for evaluation more completely for chest pain. She does not have current chest pain    Meds ordered this encounter  Medications  . omeprazole (PRILOSEC) 40 MG capsule    Sig: Take 40 mg by mouth.  . predniSONE (DELTASONE) 20 MG tablet    Sig: Take 1 tablet (20 mg total) by mouth daily with breakfast.    Dispense:  10 tablet    Refill:  0  . cyclobenzaprine (FLEXERIL) 10 MG tablet    Sig: Take 1 tablet (10 mg total) by mouth 3 (three) times daily as needed for muscle spasms.     Dispense:  30 tablet    Refill:  0    91, MD [facility] 04/01/2017, 5:42 PM

## 2017-04-01 NOTE — Patient Instructions (Addendum)
Great to see you!   Sciatica Sciatica is pain, numbness, weakness, or tingling along your sciatic nerve. The sciatic nerve starts in the lower back and goes down the back of each leg. Sciatica happens when this nerve is pinched or has pressure put on it. Sciatica usually goes away on its own or with treatment. Sometimes, sciatica may keep coming back (recur). Follow these instructions at home: Medicines  Take over-the-counter and prescription medicines only as told by your doctor.  Do not drive or use heavy machinery while taking prescription pain medicine. Managing pain  If directed, put ice on the affected area. ? Put ice in a plastic bag. ? Place a towel between your skin and the bag. ? Leave the ice on for 20 minutes, 2-3 times a day.  After icing, apply heat to the affected area before you exercise or as often as told by your doctor. Use the heat source that your doctor tells you to use, such as a moist heat pack or a heating pad. ? Place a towel between your skin and the heat source. ? Leave the heat on for 20-30 minutes. ? Remove the heat if your skin turns bright red. This is especially important if you are unable to feel pain, heat, or cold. You may have a greater risk of getting burned. Activity  Return to your normal activities as told by your doctor. Ask your doctor what activities are safe for you. ? Avoid activities that make your sciatica worse.  Take short rests during the day. Rest in a lying or standing position. This is usually better than sitting to rest. ? When you rest for a long time, do some physical activity or stretching between periods of rest. ? Avoid sitting for a long time without moving. Get up and move around at least one time each hour.  Exercise and stretch regularly, as told by your doctor.  Do not lift anything that is heavier than 10 lb (4.5 kg) while you have symptoms of sciatica. ? Avoid lifting heavy things even when you do not have  symptoms. ? Avoid lifting heavy things over and over.  When you lift objects, always lift in a way that is safe for your body. To do this, you should: ? Bend your knees. ? Keep the object close to your body. ? Avoid twisting. General instructions  Use good posture. ? Avoid leaning forward when you are sitting. ? Avoid hunching over when you are standing.  Stay at a healthy weight.  Wear comfortable shoes that support your feet. Avoid wearing high heels.  Avoid sleeping on a mattress that is too soft or too hard. You might have less pain if you sleep on a mattress that is firm enough to support your back.  Keep all follow-up visits as told by your doctor. This is important. Contact a doctor if:  You have pain that: ? Wakes you up when you are sleeping. ? Gets worse when you lie down. ? Is worse than the pain you have had in the past. ? Lasts longer than 4 weeks.  You lose weight for without trying. Get help right away if:  You cannot control when you pee (urinate) or poop (have a bowel movement).  You have weakness in any of these areas and it gets worse. ? Lower back. ? Lower belly (pelvis). ? Butt (buttocks). ? Legs.  You have redness or swelling of your back.  You have a burning feeling when you pee. This   information is not intended to replace advice given to you by your health care provider. Make sure you discuss any questions you have with your health care provider. Document Released: 07/10/2008 Document Revised: 03/08/2016 Document Reviewed: 06/10/2015 Elsevier Interactive Patient Education  2018 Elsevier Inc.  

## 2017-04-22 ENCOUNTER — Encounter: Payer: Self-pay | Admitting: Pediatrics

## 2017-04-22 ENCOUNTER — Ambulatory Visit (INDEPENDENT_AMBULATORY_CARE_PROVIDER_SITE_OTHER): Payer: Federal, State, Local not specified - PPO | Admitting: Pediatrics

## 2017-04-22 VITALS — BP 126/88 | HR 101 | Temp 98.7°F | Ht 66.0 in | Wt 219.0 lb

## 2017-04-22 DIAGNOSIS — Z6835 Body mass index (BMI) 35.0-35.9, adult: Secondary | ICD-10-CM

## 2017-04-22 DIAGNOSIS — K219 Gastro-esophageal reflux disease without esophagitis: Secondary | ICD-10-CM | POA: Diagnosis not present

## 2017-04-22 DIAGNOSIS — R739 Hyperglycemia, unspecified: Secondary | ICD-10-CM | POA: Diagnosis not present

## 2017-04-22 DIAGNOSIS — R079 Chest pain, unspecified: Secondary | ICD-10-CM | POA: Diagnosis not present

## 2017-04-22 DIAGNOSIS — Z13220 Encounter for screening for lipoid disorders: Secondary | ICD-10-CM

## 2017-04-22 LAB — BAYER DCA HB A1C WAIVED: HB A1C: 5.6 % (ref <7.0)

## 2017-04-22 NOTE — Progress Notes (Signed)
  Subjective:   Patient ID: Jean Hernandez, female    DOB: February 04, 1971, 46 y.o.   MRN: [id number] CC: Chest Pain and Emesis  HPI: Jean Hernandez is a 46 y.o. female presenting for Chest Pain and Emesis  Has had reflux symptoms for 4 yrs Past couple of days couldn't get comfortable Started about 5 days ago, doesn't remember what she was doing at the time Has felt like she had acidic feeling constantly Worse when she lays down Ate beef ramen noodle soup and grilled cheese sandwich last night Last night woke up in the middle of the night and had to throw up, wasn't able to get back to sleep Was supposed to have EGD and manometry within past two months, missed both appts, father recently died of heart attack at 68yo  Has RA Has not been taking reflux med for the past week  Has had rice today, feeling ok so far No nausea No SOB with chest pain, no sweating Never had heart problems before  Relevant past medical, surgical, family and social history reviewed. Allergies and medications reviewed and updated. History  Smoking Status  . Never Smoker  Smokeless Tobacco  . Never Used   ROS: Per HPI   Objective:    BP 126/88   Pulse (!) 101   Temp 98.7 F (37.1 C) (Oral)   Ht _0  (1.676 m)   Wt 219 lb (99.3 kg)   BMI 35.35 kg/m   Wt Readings from Last 3 Encounters:  04/22/17 219 lb (99.3 kg)  04/01/17 222 lb 9.6 oz (101 kg)  09/29/16 217 lb (98.4 kg)    Gen: NAD, alert, cooperative with exam, NCAT EYES: EOMI, no conjunctival injection, or no icterus ENT:  OP without erythema LYMPH: no cervical LAD CV: NRRR, normal S1/S2, no murmur, distal pulses 2+ b/l Resp: CTABL, no wheezes, normal WOB Abd: +BS, soft, mild discomfort epigastric area, ND. no guarding or organomegaly Ext: No edema, warm Neuro: Alert and oriented, strength equal b/l UE and LE, coordination grossly normal MSK: some chest wall pain along sternal borders with palpation  EKG: HR 99, NSR, no twave inversions, narrow  qrs  Assessment & Plan:  Jean Hernandez was seen today for chest pain and emesis.  Diagnoses and all orders for this visit:  Chest pain, unspecified type EKG unremarkable Symptoms ongoing for 5 days, has been off of PPi for about a week Was scheduled for EGD/manometry but needs to reschedule Pt to restart PPI, take twice a day, f/u with GI Avoid exacerbating foods Discussed return precautions -     EKG 12-Lead -     CMP14+EGFR  Screening for hyperlipidemia due -     Lipid Panel  Hyperglycemia Avoid sugary foods  BMI 35.0-35.9,adult Cont lifestyle changes -     Bayer DCA Hb A1c Waived -     TSH  Gastroesophageal reflux disease, esophagitis presence not specified PPI, GI f/u as above  Follow up plan: 4 weeks Assunta Found, MD [place]

## 2017-04-23 DIAGNOSIS — M170 Bilateral primary osteoarthritis of knee: Secondary | ICD-10-CM | POA: Diagnosis not present

## 2017-04-23 DIAGNOSIS — Z79899 Other long term (current) drug therapy: Secondary | ICD-10-CM | POA: Diagnosis not present

## 2017-04-23 DIAGNOSIS — M2550 Pain in unspecified joint: Secondary | ICD-10-CM | POA: Diagnosis not present

## 2017-04-23 DIAGNOSIS — M818 Other osteoporosis without current pathological fracture: Secondary | ICD-10-CM | POA: Diagnosis not present

## 2017-04-23 DIAGNOSIS — M0579 Rheumatoid arthritis with rheumatoid factor of multiple sites without organ or systems involvement: Secondary | ICD-10-CM | POA: Diagnosis not present

## 2017-04-23 LAB — LIPID PANEL
CHOLESTEROL TOTAL: 193 mg/dL (ref 100–199)
Chol/HDL Ratio: 5.8 ratio — ABNORMAL HIGH (ref 0.0–4.4)
HDL: 33 mg/dL — ABNORMAL LOW (ref >39)
LDL CALC: 98 mg/dL (ref 0–99)
TRIGLYCERIDES: 310 mg/dL — AB (ref 0–149)
VLDL Cholesterol Cal: 62 mg/dL — ABNORMAL HIGH (ref 5–40)

## 2017-04-23 LAB — CMP14+EGFR
A/G RATIO: 1.9 (ref 1.2–2.2)
ALT: 33 IU/L — ABNORMAL HIGH (ref 0–32)
AST: 29 IU/L (ref 0–40)
Albumin: 4.7 g/dL (ref 3.5–5.5)
Alkaline Phosphatase: 107 IU/L (ref 39–117)
BILIRUBIN TOTAL: 0.4 mg/dL (ref 0.0–1.2)
BUN/Creatinine Ratio: 14 (ref 9–23)
BUN: 11 mg/dL (ref 6–24)
CALCIUM: 10 mg/dL (ref 8.7–10.2)
CHLORIDE: 100 mmol/L (ref 96–106)
CO2: 22 mmol/L (ref 20–29)
Creatinine, Ser: 0.8 mg/dL (ref 0.57–1.00)
GFR, EST AFRICAN AMERICAN: 102 mL/min/{1.73_m2} (ref >59)
GFR, EST NON AFRICAN AMERICAN: 88 mL/min/{1.73_m2} (ref >59)
GLOBULIN, TOTAL: 2.5 g/dL (ref 1.5–4.5)
Glucose: 99 mg/dL (ref 65–99)
POTASSIUM: 4.2 mmol/L (ref 3.5–5.2)
SODIUM: 143 mmol/L (ref 134–144)
Total Protein: 7.2 g/dL (ref 6.0–8.5)

## 2017-04-23 LAB — TSH: TSH: 3.92 u[IU]/mL (ref 0.450–4.500)

## 2017-06-25 ENCOUNTER — Other Ambulatory Visit: Payer: Self-pay | Admitting: Family Medicine

## 2017-07-09 ENCOUNTER — Telehealth: Payer: Self-pay

## 2017-07-09 DIAGNOSIS — R053 Chronic cough: Secondary | ICD-10-CM

## 2017-07-09 DIAGNOSIS — R05 Cough: Secondary | ICD-10-CM

## 2017-07-09 NOTE — Addendum Note (Signed)
Addended by: Bearl Mulberry on: 07/09/2017 03:20 PM   Modules accepted: Orders

## 2017-07-09 NOTE — Telephone Encounter (Signed)
Please refer as requested 

## 2017-07-09 NOTE — Telephone Encounter (Signed)
Wants a referral to pulmonary   Has a cough that wont go away  Wants GBS

## 2017-07-18 ENCOUNTER — Other Ambulatory Visit (INDEPENDENT_AMBULATORY_CARE_PROVIDER_SITE_OTHER): Payer: Federal, State, Local not specified - PPO

## 2017-07-18 ENCOUNTER — Ambulatory Visit (INDEPENDENT_AMBULATORY_CARE_PROVIDER_SITE_OTHER): Payer: Federal, State, Local not specified - PPO | Admitting: Internal Medicine

## 2017-07-18 ENCOUNTER — Ambulatory Visit (INDEPENDENT_AMBULATORY_CARE_PROVIDER_SITE_OTHER)
Admission: RE | Admit: 2017-07-18 | Discharge: 2017-07-18 | Disposition: A | Discharge status: Home / Self Care | Payer: Federal, State, Local not specified - PPO | Source: Ambulatory Visit | Attending: Internal Medicine | Admitting: Internal Medicine

## 2017-07-18 ENCOUNTER — Encounter: Payer: Self-pay | Admitting: Internal Medicine

## 2017-07-18 VITALS — BP 126/86 | HR 133 | Ht 66.0 in | Wt 220.0 lb

## 2017-07-18 DIAGNOSIS — M069 Rheumatoid arthritis, unspecified: Secondary | ICD-10-CM

## 2017-07-18 DIAGNOSIS — R05 Cough: Secondary | ICD-10-CM | POA: Diagnosis not present

## 2017-07-18 DIAGNOSIS — R058 Other specified cough: Secondary | ICD-10-CM

## 2017-07-18 DIAGNOSIS — E7800 Pure hypercholesterolemia, unspecified: Secondary | ICD-10-CM | POA: Diagnosis not present

## 2017-07-18 DIAGNOSIS — R0602 Shortness of breath: Secondary | ICD-10-CM | POA: Diagnosis not present

## 2017-07-18 LAB — CBC WITH DIFFERENTIAL/PLATELET
BASOS ABS: 0.1 10*3/uL (ref 0.0–0.1)
BASOS PCT: 0.8 % (ref 0.0–3.0)
EOS ABS: 0 10*3/uL (ref 0.0–0.7)
Eosinophils Relative: 0.1 % (ref 0.0–5.0)
HEMATOCRIT: 46.1 % — AB (ref 36.0–46.0)
Hemoglobin: 15.4 g/dL — ABNORMAL HIGH (ref 12.0–15.0)
LYMPHS ABS: 1.6 10*3/uL (ref 0.7–4.0)
Lymphocytes Relative: 13 % (ref 12.0–46.0)
MCHC: 33.3 g/dL (ref 30.0–36.0)
MCV: 91.7 fl (ref 78.0–100.0)
MONO ABS: 1.1 10*3/uL — AB (ref 0.1–1.0)
Monocytes Relative: 9.3 % (ref 3.0–12.0)
NEUTROS ABS: 9.3 10*3/uL — AB (ref 1.4–7.7)
NEUTROS PCT: 76.8 % (ref 43.0–77.0)
PLATELETS: 294 10*3/uL (ref 150.0–400.0)
RBC: 5.03 Mil/uL (ref 3.87–5.11)
RDW: 12.9 % (ref 11.5–15.5)
WBC: 12.1 10*3/uL — ABNORMAL HIGH (ref 4.0–10.5)

## 2017-07-18 LAB — TSH: TSH: 1.2 u[IU]/mL (ref 0.35–4.50)

## 2017-07-18 MED ORDER — MEPERIDINE HCL 50 MG PO TABS: 50.0000 mg | ORAL_TABLET | ORAL | 0 refills | Status: DC | PRN

## 2017-07-18 MED ORDER — FAMOTIDINE 20 MG PO TABS: ORAL_TABLET | ORAL | 2 refills | Status: DC

## 2017-07-18 MED ORDER — PANTOPRAZOLE SODIUM 40 MG PO TBEC: 40.0000 mg | DELAYED_RELEASE_TABLET | Freq: Every day | ORAL | 2 refills | Status: DC

## 2017-07-18 MED ORDER — PREDNISONE 10 MG PO TABS: ORAL_TABLET | ORAL | 0 refills | Status: DC

## 2017-07-18 NOTE — Progress Notes (Signed)
Subjective:     Patient ID: Jean Hernandez, female   DOB: Jun 09, 1971,    MRN: [id number]  HPI   69 yowf  Never smoker healthy then around 2008 while in [place] variable pattern of intermittnent   sneezing/ cough > allergy eval > shots x a couple months no better changed go justy  flonase / zyrtec until 2010 joint stiffness abrupt dx with RA per Jean Hernandez then cough worse/daily since   around 2012  Then severe  to point of vomiting around  2o14  With barium swallow done around 2016 and nl gastric emptying  and [facility] eval 12/27/16 > c.  Omeprazole bid and no change so eval by GI  May 2018 rec manometry / egd and deferred by pt who self referred to pulmonary clinic 07/18/2017      07/18/2017 1st [facility] office visit/ Jean Hernandez   Chief Complaint  Patient presents with  . Pulmoary Consult    Self referral. Pt c/o cough and SOB for the past 2 yrs, worse for the past 3 wks. Her cough bothers her all day and wakes her up in the night. She coughs until she vomits ands has also been developing HA. She gets SOB with walking long distances and also when she talks.   if not coughing breathing is fine  Cough Previously not bothering her as much sleeping as daytime but now waking her up/ does better rotated on side  In am coughs up scant  white mucus but really not excess/ purulent sputum or mucus plugs   Anoro x one year no better  Prednisone may help / took pred up to 20 mg prn     Kouffman Reflux v Neurogenic Cough Differentiator Reflux Comments  Do you awaken from a sound sleep coughing violently?                            With trouble breathing? Sometimes yes   Do you have choking episodes when you cannot  Get enough air, gasping for air ?              Yes   Do you usually cough when you lie down into  The bed, or when you just lie down to rest ?                          no   Do you usually cough after meals or eating?         no   Do you cough when (or after) you bend over?    no   GERD SCORE      Kouffman Reflux v Neurogenic Cough Differentiator Neurogenic   Do you more-or-less cough all day long? All day    Does change of temperature make you cough? Hot muggy   Does laughing or chuckling cause you to cough? no   Do fumes (perfume, automobile fumes, burned  Toast, etc.,) cause you to cough ?      smoke   Does speaking, singing, or talking on the phone cause you to cough   ?               Def speaking   Neurogenic/Airway score       Not limited by breathing from desired activities  Unless coughing  No obvious other patterns in day to day or daytime variability or assoc excess/ purulent sputum or mucus plugs or  hemoptysis or cp or chest tightness, subjective wheeze or overt sinus or hb symptoms. No unusual exp hx or h/o childhood pna/ asthma or knowledge of premature birth.  Also denies any obvious fluctuation of symptoms with weather or environmental changes or other aggravating or alleviating factors except as outlined above   Current Allergies, Complete Past Medical History, Past Surgical History, Family History, and Social History were reviewed in Jean Hernandez record.  ROS  The following are not active complaints unless bolded Hoarseness, sore throat, dysphagia, dental problems, itching, sneezing,  nasal congestion or discharge of excess mucus or purulent secretions, ear ache,   fever, chills, sweats, unintended wt loss or wt gain, classically pleuritic or exertional cp,  orthopnea pnd or leg swelling, presyncope, palpitations, abdominal pain, anorexia, nausea, vomiting, diarrhea  or change in bowel habits or change in bladder habits, change in stools or change in urine, dysuria, hematuria,  rash, arthralgias, visual complaints, headache, numbness, weakness or ataxia or problems with walking or coordination,  change in mood/affect or memory.        No outpatient prescriptions have been marked as taking for the 07/18/17 encounter (Office Visit) with Jean Cowden, MD.           Review of Systems     Objective:   Physical Exam    Obese amb  frustrated wf nad   Wt Readings from Last 3 Encounters:  07/18/17 220 lb (99.8 kg)  04/22/17 219 lb (99.3 kg)  04/01/17 222 lb 9.6 oz (101 kg)    Vital signs reviewed  - Note on arrival 02 sats  92% on RA  - not recorded pulse 133 on arrival down to 110 by exam   HEENT: nl dentition  bilaterally, and oropharynx. Nl external ear canals without cough reflex - moderate bilateral non-specific turbinate edema     NECK :  without JVD/Nodes/TM/ nl carotid upstrokes bilaterally   LUNGS: no acc muscle use,  Nl contour chest which is clear to A and P bilaterally without cough on insp or exp maneuvers   CV:  RRR  no s3 or murmur or increase in P2, and no edema   ABD:  Massively soft and nontender with nl inspiratory excursion in the supine position. No bruits or organomegaly appreciated, bowel sounds nl  MS:  Nl gait/ ext warm without deformities, calf tenderness, cyanosis or clubbing No obvious joint restrictions   SKIN: warm and dry without lesions    NEURO:  alert, approp, nl sensorium with  no motor or cerebellar deficits apparent.    CXR PA and Lateral:   07/18/2017 :    I personally reviewed images and   impression as follows:   Mild non specific increased markings    Labs ordered/ reviewed:      Chemistry      Component Value Date/Time   NA 143 04/22/2017 1138   K 4.2 04/22/2017 1138   CL 100 04/22/2017 1138   CO2 22 04/22/2017 1138   BUN 11 04/22/2017 1138   CREATININE 0.80 04/22/2017 1138      Component Value Date/Time   CALCIUM 10.0 04/22/2017 1138   ALKPHOS 107 04/22/2017 1138   AST 29 04/22/2017 1138   ALT 33 (H) 04/22/2017 1138   BILITOT 0.4 04/22/2017 1138        Lab Results  Component Value Date   WBC 12.1 (H) 07/18/2017   HGB 15.4 (H) 07/18/2017   HCT 46.1 (H) 07/18/2017   MCV  91.7 07/18/2017   PLT 294.0 07/18/2017        Lab Results   Component Value Date   TSH 1.20 07/18/2017                   Assessment:

## 2017-07-18 NOTE — Patient Instructions (Signed)
The key to effective treatment for your cough is eliminating the non-stop cycle of cough you're stuck in long enough to let your airway heal completely and then see if there is anything still making you cough once you stop the cough suppression, but this should take no more than 5 days to figure out  First take delsym two tsp every 12 hours and supplement if needed with  Demerol  50 mg up to 1every 4 hours to suppress the urge to cough at all or even clear your throat. Swallowing water or using ice chips/non mint and menthol containing candies (such as lifesavers or sugarless jolly ranchers) are also effective.  You should rest your voice and avoid activities that you know make you cough.  Once you have eliminated the cough for 3 straight days try reducing the demerol first,  then the delsym as tolerated.    Prednisone 10 mg take  4 each am x 2 days,   2 each am x 2 days,  1 each am x 2 days and stop (this is to eliminate allergies and inflammation from coughing)  For drainage / throat tickle try take CHLORPHENIRAMINE  4 mg - take one every 4 hours as needed - available over the counter- may cause drowsiness so start with just a bedtime dose or two and see how you tolerate it before trying in daytime    Protonix (pantoprazole) Take 30-60 min before first meal of the day and Pepcid 20 mg one bedtime plus chlorpheniramine 4 mg x 2 at bedtime (both available over the counter)  until cough is completely gone for at least a week without the need for cough suppression  GERD (REFLUX)  is an extremely common cause of respiratory symptoms, many times with no significant heartburn at all.    It can be treated with medication, but also with lifestyle changes including avoidance of late meals, excessive alcohol, smoking cessation, and avoid fatty foods, chocolate, peppermint, colas, red wine, and acidic juices such as orange juice.  NO MINT OR MENTHOL PRODUCTS SO NO COUGH DROPS   USE HARD CANDY INSTEAD (jolley  ranchers or Stover's or Lifesavers (all available in sugarless versions) NO OIL BASED VITAMINS - use powdered substitutes.   Please remember to go to the lab and x-ray department downstairs in the basement  for your tests - we will call you with the results when they are available.     Please schedule a follow up office visit in 4 weeks, sooner if needed with all active meds in hand

## 2017-07-19 DIAGNOSIS — M069 Rheumatoid arthritis, unspecified: Secondary | ICD-10-CM | POA: Insufficient documentation

## 2017-07-19 LAB — RESPIRATORY ALLERGY PROFILE REGION II ~~LOC~~
ALLERGEN, CEDAR TREE, T6: 0.28 kU/L — AB
ALLERGEN, COMM SILVER BIRCH, T3: 0.63 kU/L — AB
ALLERGEN, MULBERRY, T70: 0.26 kU/L — AB
Allergen, Cottonwood, t14: 0.36 kU/L — ABNORMAL HIGH
Allergen, D pternoyssinus,d7: <0.1 kU/L
Allergen, Mouse Urine Protein, e78: <0.1 kU/L
Allergen, Oak,t7: 1.16 kU/L — ABNORMAL HIGH
Aspergillus fumigatus, m3: <0.1 kU/L
Bermuda Grass: 16 kU/L — ABNORMAL HIGH
Box Elder IgE: 0.35 kU/L — ABNORMAL HIGH
CLADOSPORIUM HERBARUM (M2) IGE: <0.1 kU/L
CLASS: 0
CLASS: 0
CLASS: 0
CLASS: 0
CLASS: 0
CLASS: 0
CLASS: 0
CLASS: 0
CLASS: 0
CLASS: 1
CLASS: 1
CLASS: 3
COMMON RAGWEED (SHORT) (W1) IGE: 0.4 kU/L — AB
Class: 0
Class: 0
Class: 0
Class: 0
Class: 0
Class: 0
Class: 1
Class: 1
Class: 1
Class: 2
Class: 3
Class: 3
Cockroach: 0.39 kU/L — ABNORMAL HIGH
Dog Dander: <0.1 kU/L
Elm IgE: 0.33 kU/L — ABNORMAL HIGH
IgE (Immunoglobulin E), Serum: 197 kU/L — ABNORMAL HIGH (ref <=114)
Johnson Grass: 5.31 kU/L — ABNORMAL HIGH
PECAN/HICKORY TREE IGE: 0.31 kU/L — AB
ROUGH PIGWEED IGE: 0.31 kU/L — AB
Sheep Sorrel IgE: 0.33 kU/L — ABNORMAL HIGH
Timothy Grass: 12.6 kU/L — ABNORMAL HIGH

## 2017-07-19 LAB — INTERPRETATION:

## 2017-07-19 NOTE — Assessment & Plan Note (Signed)
Onset 2008 with assoc rhinitis> ? failed allergy shots in [place] - Allergy profile 07/18/2017 >  Eos 0.0 /  IgE  Pending  - cyclical gerd rx  07/18/2017 >>>  The most common causes of chronic cough in immunocompetent adults include the following: upper airway cough syndrome (UACS), previously referred to as postnasal drip syndrome (PNDS), which is caused by variety of rhinosinus conditions; (2) asthma; (3) GERD; (4) chronic bronchitis from cigarette smoking or other inhaled environmental irritants; (5) nonasthmatic eosinophilic bronchitis; and (6) bronchiectasis.   These conditions, singly or in combination, have accounted for up to 94% of the causes of chronic cough in prospective studies.   Other conditions have constituted no >6% of the causes in prospective studies These have included bronchogenic carcinoma, chronic interstitial pneumonia, sarcoidosis, left ventricular failure, ACEI-induced cough, and aspiration from a condition associated with pharyngeal dysfunction.    Chronic cough is often simultaneously caused by more than one condition. A single cause has been found from 38 to 82% of the time, multiple causes from 18 to 62%. Multiply caused cough has been the result of three diseases up to 42% of the time.    I strongly suspect here: Upper airway cough syndrome (previously labeled PNDS),  is so named because it's frequently impossible to sort out how much is  CR/sinusitis with freq throat clearing (which can be related to primary GERD)   vs  causing  secondary (" extra esophageal")  GERD from wide swings in gastric pressure that occur with throat clearing and cough to point of vomiting, often  promoting self use of mint and menthol lozenges that reduce the lower esophageal sphincter tone and exacerbate the problem further in a cyclical fashion.   These are the same pts (now being labeled as having "irritable larynx syndrome" by some cough centers) who not infrequently have a history of having  failed to tolerate ace inhibitors,  dry powder inhalers or biphosphonates or report having atypical/extraesophageal reflux symptoms that don't respond to standard doses of PPI  and are easily confused as having aecopd or asthma flares by even experienced allergists/ pulmonologists (myself included).   Of the three most common causes of  Sub-acute or recurrent or chronic cough, only one (GERD)  can actually contribute to/ trigger  the other two (asthma and post nasal drip syndrome)  and perpetuate the cylce of cough.  While not intuitively obvious, many patients with chronic low grade reflux do not cough until there is a primary insult that disturbs the protective epithelial barrier and exposes sensitive nerve endings.   This is typically viral but can be due to allergic or non-allergic  rhinitis as may be the case here.   The point is that once this occurs, it is difficult to eliminate the cycle  using anything but a maximally effective acid suppression regimen at least in the short run, accompanied by an appropriate diet to address non acid GERD and control / eliminate the cough itself for at least 3 days.   Will try combination of short course prednisone, 1st gen H1 blockers per guidelines , max rx for gerd and demerol to eliminate cycle of coughing then regroup in 2 weeks   Total time devoted to counseling  > 50 % of initial 60 min office visit:  review case with pt/husband discussion of options/alternatives/ personally creating written customized instructions  in presence of pt  then going over those specific  Instructions directly with the pt including how to use all of  the meds but in particular covering each new medication in detail and the difference between the maintenance= "automatic" meds and the prns using an action plan format for the latter (If this problem/symptom => do that organization reading Left to right).  Please see AVS from this visit for a full list of these instructions which I  personally wrote for this pt and  are unique to this visit.

## 2017-07-19 NOTE — Progress Notes (Signed)
Spoke with pt and notified of results per Dr. Wert. Pt verbalized understanding and denied any questions. 

## 2017-07-19 NOTE — Assessment & Plan Note (Signed)
Mild increased lung markings noted 07/18/2017   Her cough is not reproducible with inspiration and no doe but probably needs pfts to in setting of longstanding RA to r/o NSIP and other forms of RA lung dz and for baseline purposes > will do later after address the cough

## 2017-07-22 ENCOUNTER — Telehealth: Payer: Self-pay | Admitting: Internal Medicine

## 2017-07-22 ENCOUNTER — Institutional Professional Consult (permissible substitution): Payer: Federal, State, Local not specified - PPO | Admitting: Pulmonary Disease

## 2017-07-22 NOTE — Progress Notes (Signed)
LMTCB

## 2017-07-22 NOTE — Telephone Encounter (Signed)
Notes recorded by Nyoka Cowden, MD on 07/19/2017 at 5:15 PM EDT Call patient : Studies are c/w moderated allergies to Trees/grass/ ragweed> avoidance>> Be sure patient has f/u ov so we can go over all the details of this study and get a plan together moving forward - ok to move up f/u if not feeling better and wants to be seen sooner   Left message for patient to call back for results.

## 2017-07-23 NOTE — Telephone Encounter (Signed)
lmomtcb x 2 for the pt.  

## 2017-07-24 NOTE — Telephone Encounter (Signed)
Patient returning call, please try work # first [phone] and if cannot reach, please then try [phone].

## 2017-07-24 NOTE — Telephone Encounter (Signed)
ATC pt, no answer. Left message for pt to call back.  

## 2017-07-24 NOTE — Telephone Encounter (Signed)
Advised pt of results. Pt understood and nothing further is needed.   

## 2017-08-02 ENCOUNTER — Ambulatory Visit: Payer: Federal, State, Local not specified - PPO | Admitting: Internal Medicine

## 2017-08-06 ENCOUNTER — Encounter: Payer: Self-pay | Admitting: Internal Medicine

## 2017-08-06 ENCOUNTER — Ambulatory Visit (INDEPENDENT_AMBULATORY_CARE_PROVIDER_SITE_OTHER): Payer: Federal, State, Local not specified - PPO | Admitting: Internal Medicine

## 2017-08-06 VITALS — BP 124/84 | HR 102 | Ht 66.0 in | Wt 221.6 lb

## 2017-08-06 DIAGNOSIS — R05 Cough: Secondary | ICD-10-CM | POA: Diagnosis not present

## 2017-08-06 DIAGNOSIS — Z23 Encounter for immunization: Secondary | ICD-10-CM | POA: Diagnosis not present

## 2017-08-06 DIAGNOSIS — R058 Other specified cough: Secondary | ICD-10-CM

## 2017-08-06 NOTE — Progress Notes (Signed)
Subjective:     Patient ID: Jean Hernandez, female   DOB: 1971-03-20,    MRN: [id number]  HPI   79 yowf  Never smoker healthy then around 2008 while in [place] variable pattern of intermittnent   sneezing/ cough > allergy eval > shots x a couple months no better changed go justy  flonase / zyrtec until 2010 joint stiffness abrupt dx with RA per Nickola Major then cough worse/daily since   around 2012  Then severe  to point of vomiting around  2o14  With barium swallow done around 2016 and nl gastric emptying  and [facility] eval 12/27/16 > c.  Omeprazole bid and no change so eval by GI  May 2018 rec manometry / egd and deferred by pt who self referred to pulmonary clinic 07/18/2017      07/18/2017 1st [facility] office visit/ Stacy Deshler   Chief Complaint  Patient presents with  . Pulmoary Consult    Self referral. Pt c/o cough and SOB for the past 2 yrs, worse for the past 3 wks. Her cough bothers her all day and wakes her up in the night. She coughs until she vomits ands has also been developing HA. She gets SOB with walking long distances and also when she talks.   if not coughing breathing is fine  Cough Previously not bothering her as much sleeping as daytime but now waking her up/ does better rotated on side  In am coughs up scant  white mucus but really not excess/ purulent sputum or mucus plugs   Anoro x one year no better  Prednisone may help / took pred up to 20 mg prn   Kouffman Reflux v Neurogenic Cough Differentiator Reflux Comments  Do you awaken from a sound sleep coughing violently?                            With trouble breathing? Sometimes yes   Do you have choking episodes when you cannot  Get enough air, gasping for air ?              Yes   Do you usually cough when you lie down into  The bed, or when you just lie down to rest ?                          no   Do you usually cough after meals or eating?         no   Do you cough when (or after) you bend over?    no   GERD SCORE      Kouffman Reflux v Neurogenic Cough Differentiator Neurogenic   Do you more-or-less cough all day long? All day    Does change of temperature make you cough? Hot muggy   Does laughing or chuckling cause you to cough? no   Do fumes (perfume, automobile fumes, burned  Toast, etc.,) cause you to cough ?      smoke   Does speaking, singing, or talking on the phone cause you to cough   ?               Def speaking   Neurogenic/Airway score       Not limited by breathing from desired activities  Unless coughing rec  First take delsym two tsp every 12 hours and supplement if needed with  Demerol  50 mg up to [employer]  4 hours to suppress the urge to cough at all or even clear your throat. Swallowing water or using ice chips/non mint and menthol containing candies (such as lifesavers or sugarless jolly ranchers) are also effective.  You should rest your voice and avoid activities that you know make you cough. Once you have eliminated the cough for 3 straight days try reducing the demerol first,  then the delsym as tolerated.   Prednisone 10 mg take  4 each am x 2 days,   2 each am x 2 days,  1 each am x 2 days and stop (this is to eliminate allergies and inflammation from coughing) For drainage / throat tickle try take CHLORPHENIRAMINE  4 mg - take one every 4 hours as needed - available over the counter- may cause drowsiness so start with just a bedtime dose or two and see how you tolerate it before trying in daytime   Protonix (pantoprazole) Take 30-60 min before first meal of the day and Pepcid 20 mg one bedtime plus chlorpheniramine 4 mg x 2 at bedtime (both available over the counter)  until cough is completely gone for at least a week without the need for cough suppression GERD  Please remember to go to the lab and x-ray department downstairs in the basement  for your tests - we will call you with the results when they are available. Please schedule a follow up office visit in 4 weeks, sooner if  needed with all active meds in hand     08/06/2017  f/u ov/[facility] re: uacs  Chief Complaint  Patient presents with  . Follow-up    Cough has improved some but has not resolved. She is occ coughing up some clear sputum.    ate sausage night before ov > cough to vomit / otherwise 95% improved an no longer needing demerol or deslym   No obvious patterns in  day to day or daytime variability or assoc  purulent sputum or mucus plugs or hemoptysis or cp or chest tightness, subjective wheeze or overt sinus symptoms. No unusual exp hx or h/o childhood pna/ asthma or knowledge of premature birth.  Sleeping ok flat without nocturnal  or early am exacerbation  of respiratory  c/o's or need for noct saba. Also denies any obvious fluctuation of symptoms with weather or environmental changes or other aggravating or alleviating factors except as outlined above   Current Allergies, Complete Past Medical History, Past Surgical History, Family History, and Social History were reviewed in [employer] record.  ROS  The following are not active complaints unless bolded Hoarseness, sore throat, dysphagia, dental problems, itching, sneezing,  nasal congestion or discharge of excess mucus or purulent secretions, ear ache,   fever, chills, sweats, unintended wt loss or wt gain, classically pleuritic or exertional cp,  orthopnea pnd or leg swelling, presyncope, palpitations, abdominal pain, anorexia, nausea, vomiting, diarrhea  or change in bowel habits or change in bladder habits, change in stools or change in urine, dysuria, hematuria,  rash, arthralgias, visual complaints, headache, numbness, weakness or ataxia or problems with walking or coordination,  change in mood/affect or memory.        Current Meds  Medication Sig  . albuterol (PROVENTIL HFA;VENTOLIN HFA) 108 (90 Base) MCG/ACT inhaler Inhale 2 puffs into the lungs every 6 (six) hours as needed for wheezing or shortness of breath.  .  Calcium Citrate (CITRACAL PO) Take 1 tablet by mouth 2 (two) times daily.  . chlorpheniramine (CHLOR-TRIMETON)  4 MG tablet Take 8 mg by mouth at bedtime. And as needed during the day every 4 hours  . etanercept (ENBREL) 25 MG injection Inject 50 mg into the skin once a week.   . famotidine (PEPCID) 20 MG tablet One at bedtime  . leflunomide (ARAVA) 20 MG tablet Take 20 mg by mouth daily.  Marland Kitchen loperamide (IMODIUM) 2 MG capsule Take 2 mg by mouth as needed for diarrhea or loose stools.  . naproxen (NAPROSYN) 500 MG tablet Take 500 mg by mouth as needed.  . pantoprazole (PROTONIX) 40 MG tablet Take 1 tablet (40 mg total) by mouth daily. Take 30-60 min before first meal of the day                    Objective:   Physical Exam    Obese amb       08/06/2017     221   07/18/17 220 lb (99.8 kg)  04/22/17 219 lb (99.3 kg)  04/01/17 222 lb 9.6 oz (101 kg)    Vital signs reviewed  - Note on arrival 02 sats 100 % on RA      HEENT: nl dentition  bilaterally, and oropharynx. Nl external ear canals without cough reflex - moderate bilateral non-specific turbinate edema     NECK :  without JVD/Nodes/TM/ nl carotid upstrokes bilaterally   LUNGS: no acc muscle use,  Nl contour chest which is clear to A and P bilaterally without cough on insp or exp maneuvers   CV:  RRR  no s3 or murmur or increase in P2, and no edema   ABD:  Massively soft and nontender with nl inspiratory excursion in the supine position. No bruits or organomegaly appreciated, bowel sounds nl  MS:  Nl gait/ ext warm without deformities, calf tenderness, cyanosis or clubbing No obvious joint restrictions   SKIN: warm and dry without lesions    NEURO:  alert, approp, nl sensorium with  no motor or cerebellar deficits apparent.    CXR PA and Lateral:   07/18/2017 :    I personally reviewed images and   impression as follows:   Mild non specific increased markings     Labs   reviewed:      Chemistry       Component Value Date/Time   NA 143 04/22/2017 1138   K 4.2 04/22/2017 1138   CL 100 04/22/2017 1138   CO2 22 04/22/2017 1138   BUN 11 04/22/2017 1138   CREATININE 0.80 04/22/2017 1138      Component Value Date/Time   CALCIUM 10.0 04/22/2017 1138   ALKPHOS 107 04/22/2017 1138   AST 29 04/22/2017 1138   ALT 33 (H) 04/22/2017 1138   BILITOT 0.4 04/22/2017 1138        Lab Results  Component Value Date   WBC 12.1 (H) 07/18/2017   HGB 15.4 (H) 07/18/2017   HCT 46.1 (H) 07/18/2017   MCV 91.7 07/18/2017   PLT 294.0 07/18/2017        Lab Results  Component Value Date   TSH 1.20 07/18/2017                   Assessment:

## 2017-08-06 NOTE — Assessment & Plan Note (Addendum)
Onset 2008 with assoc rhinitis> ? failed allergy shots in [place] - Allergy profile 07/18/2017 >  Eos 0.0 /  IgE  197  Pos RAST Trees/grass/ ragweed - cyclical gerd rx  07/18/2017 >  95% improved 08/06/2017 > contineu gerd rx plus 1st gen H1 blockers per guidelines     Still favor strongly uacs related to gerd over asthma though note she clearly has an element of atopy and a likely scenario is pnds > cough > reflux > cough  So rec try 1st gen H1 blockers per guidelines  And see if cough continues to flare  - if so ok to take one more cycle of prednisone before resorting to demerol again while awaiting GI eval for gerd.  I had an extended discussion with the patient reviewing all relevant studies completed to date and  lasting 15 to 20 minutes of a 25 minute visit    Each maintenance medication was reviewed in detail including most importantly the difference between maintenance and prns and under what circumstances the prns are to be triggered using an action plan format that is not reflected in the computer generated alphabetically organized AVS.    Please see AVS for specific instructions unique to this visit that I personally wrote and verbalized to the the pt in detail and then reviewed with pt  by my nurse highlighting any  changes in therapy recommended at today's visit to their plan of care.    Return in 6 weeks for pfts as also has RA and at risk of ILD/ bronchiolitis

## 2017-08-06 NOTE — Patient Instructions (Addendum)
No change in recommendations including using over the counter delsym and chlortrimeton as needed  If cough  Gets worse >  Prednisone 10 mg take  4 each am x 2 days,   2 each am x 2 days,  1 each am x 2 days and stop   See your gi doctor at your convenience re reflux options    Please schedule a follow up office visit in 4 weeks, sooner if needed with pfts

## 2017-08-20 DIAGNOSIS — M0579 Rheumatoid arthritis with rheumatoid factor of multiple sites without organ or systems involvement: Secondary | ICD-10-CM | POA: Diagnosis not present

## 2017-08-20 DIAGNOSIS — M818 Other osteoporosis without current pathological fracture: Secondary | ICD-10-CM | POA: Diagnosis not present

## 2017-08-20 DIAGNOSIS — M170 Bilateral primary osteoarthritis of knee: Secondary | ICD-10-CM | POA: Diagnosis not present

## 2017-08-20 DIAGNOSIS — M2550 Pain in unspecified joint: Secondary | ICD-10-CM | POA: Diagnosis not present

## 2017-08-26 ENCOUNTER — Telehealth: Payer: Self-pay | Admitting: Family Medicine

## 2017-08-26 ENCOUNTER — Encounter: Payer: Self-pay | Admitting: Family Medicine

## 2017-08-26 ENCOUNTER — Ambulatory Visit: Payer: Federal, State, Local not specified - PPO | Admitting: Family Medicine

## 2017-08-26 ENCOUNTER — Ambulatory Visit (INDEPENDENT_AMBULATORY_CARE_PROVIDER_SITE_OTHER): Payer: Federal, State, Local not specified - PPO

## 2017-08-26 VITALS — BP 170/119 | HR 134 | Temp 97.1°F | Ht 66.0 in | Wt 222.6 lb

## 2017-08-26 DIAGNOSIS — M19012 Primary osteoarthritis, left shoulder: Secondary | ICD-10-CM | POA: Diagnosis not present

## 2017-08-26 DIAGNOSIS — M898X1 Other specified disorders of bone, shoulder: Secondary | ICD-10-CM | POA: Diagnosis not present

## 2017-08-26 NOTE — Progress Notes (Signed)
   HPI  Patient presents today here with right medical pain.  Patient explains that she had a broken clavicle when she was in sixth grade.  She states that she felt a pop while turning over in bed yesterday.  Overnight she has had severe pain sleep and also pain with moving her arm.  She denies any overt trauma except for rolling over in bed.  She was started on Demerol 1 day ago from pulmonology to help a cough.  This is doing a very good job.  She has not tried anything else for pain  PMH: Smoking status noted ROS: Per HPI  Objective: BP (!) 170/119   Pulse (!) 134   Temp (!) 97.1 F (36.2 C) (Oral)   Ht 5\' 6"  (1.676 m)   Wt 222 lb 9.6 oz (101 kg)   BMI 35.93 kg/m  Gen: NAD, alert, cooperative with exam HEENT: NCAT CV: RRR, good S1/S2, no murmur Resp: CTABL, no wheezes, non-labored Ext: No edema, warm Neuro: Alert and oriented, No gross deficits MSK Limited use of right arm due to pain.  Tenderness to palpation of the right [place] joint, right AC joint nontender to palpation, mild tenderness to palpation along the shaft of the right clavicle.  Assessment and plan:  #Clavicle pain I believe what she has felt is a possible dislocation and relocation of the right [place] joint. Recommended sling for comfort for a few days, NSAIDs scheduled times 1 week If not improving in 3-4 days recommend orthopedic referral    Orders Placed This Encounter  Procedures  . DG Clavicle Right    Standing Status:   Future    Number of Occurrences:   1    Standing Expiration Date:   10/26/2018    Order Specific Question:   Reason for Exam (SYMPTOM  OR DIAGNOSIS REQUIRED)    Answer:   pain    Order Specific Question:   Is patient pregnant?    Answer:   No    Order Specific Question:   Preferred imaging location?    Answer:   Internal    Order Specific Question:   Radiology Contrast Protocol - do NOT remove file path    Answer:   [email or web address]      12/25/2018, MD [facility] 08/26/2017, 8:29 AM

## 2017-08-26 NOTE — Telephone Encounter (Signed)
Please review and advise.

## 2017-08-26 NOTE — Telephone Encounter (Signed)
Patient aware that referral placed  

## 2017-08-26 NOTE — Telephone Encounter (Signed)
Ok with referral, it has been placed  Murtis Sink, MD [facility] 08/26/2017, 2:21 PM

## 2017-08-26 NOTE — Patient Instructions (Signed)
Great to see you!  Call in 2-3 days if you are not improving  For now take naproxen with food for about 1 week, wear the sling for comfort.  Do not sleep in the sling.

## 2017-09-03 ENCOUNTER — Encounter: Payer: Self-pay | Admitting: Internal Medicine

## 2017-09-03 ENCOUNTER — Ambulatory Visit (INDEPENDENT_AMBULATORY_CARE_PROVIDER_SITE_OTHER): Payer: Federal, State, Local not specified - PPO | Admitting: Internal Medicine

## 2017-09-03 ENCOUNTER — Other Ambulatory Visit: Payer: Self-pay | Admitting: Internal Medicine

## 2017-09-03 ENCOUNTER — Ambulatory Visit: Payer: Federal, State, Local not specified - PPO | Admitting: Internal Medicine

## 2017-09-03 VITALS — BP 126/90 | HR 107 | Ht 66.0 in | Wt 225.0 lb

## 2017-09-03 DIAGNOSIS — R059 Cough, unspecified: Secondary | ICD-10-CM

## 2017-09-03 DIAGNOSIS — R058 Other specified cough: Secondary | ICD-10-CM

## 2017-09-03 DIAGNOSIS — R0600 Dyspnea, unspecified: Secondary | ICD-10-CM | POA: Diagnosis not present

## 2017-09-03 DIAGNOSIS — R05 Cough: Secondary | ICD-10-CM

## 2017-09-03 LAB — PULMONARY FUNCTION TEST
FEF 25-75 Post: 1.48 L/sec
FEF 25-75 Pre: 1.71 L/sec
FEF2575-%CHANGE-POST: -13 %
FEF2575-%Pred-Post: 50 %
FEF2575-%Pred-Pre: 58 %
FEV1-%Change-Post: -4 %
FEV1-%Pred-Post: 82 %
FEV1-%Pred-Pre: 86 %
FEV1-PRE: 2.62 L
FEV1-Post: 2.5 L
FEV1FVC-%CHANGE-POST: 6 %
FEV1FVC-%Pred-Pre: 90 %
FEV6-%Change-Post: -5 %
FEV6-%PRED-PRE: 90 %
FEV6-%Pred-Post: 85 %
FEV6-POST: 3.19 L
FEV6-PRE: 3.38 L
FEV6FVC-%Change-Post: 5 %
FEV6FVC-%PRED-POST: 103 %
FEV6FVC-%PRED-PRE: 97 %
FVC-%CHANGE-POST: -10 %
FVC-%PRED-POST: 83 %
FVC-%PRED-PRE: 93 %
FVC-POST: 3.19 L
FVC-PRE: 3.58 L
POST FEV6/FVC RATIO: 100 %
PRE FEV6/FVC RATIO: 94 %
Post FEV1/FVC ratio: 78 %
Pre FEV1/FVC ratio: 73 %

## 2017-09-03 MED ORDER — MONTELUKAST SODIUM 10 MG PO TABS: 10.0000 mg | ORAL_TABLET | Freq: Every day | ORAL | 11 refills | Status: DC

## 2017-09-03 NOTE — Patient Instructions (Addendum)
Please see patient coordinator before you leave today  to schedule sinus CT   Singulair 10 mg at bedtime   For drainage / throat tickle try take CHLORPHENIRAMINE  4 mg - take one every 4 hours as needed - available over the counter- may cause drowsiness so start with just a bedtime dose or two and see how you tolerate it before trying in daytime    GERD (REFLUX)  is an extremely common cause of respiratory symptoms just like yours , many times with no obvious heartburn at all.    It can be treated with medication, but also with lifestyle changes including elevation of the head of your bed (ideally with 6 inch  bed blocks),  Smoking cessation, avoidance of late meals, excessive alcohol, and avoid fatty foods, chocolate, peppermint, colas, red wine, and acidic juices such as orange juice.  NO MINT OR MENTHOL PRODUCTS SO NO COUGH DROPS  USE SUGARLESS CANDY INSTEAD (Jolley ranchers or Stover's or Life Savers) or even ice chips will also do - the key is to swallow to prevent all throat clearing. NO OIL BASED VITAMINS - use powdered substitutes.    Keep appt to see Dr Loreta Ave to complete your GI work up    Please schedule a follow up office visit in 6 weeks, call sooner if needed  Add:  Consider trial of dymista for uacs/ pnds if not better from singulair trial

## 2017-09-03 NOTE — Assessment & Plan Note (Addendum)
Onset 2008 with assoc rhinitis> ? failed allergy shots in [place] - Allergy profile 07/18/2017 >  Eos 0.0 /  IgE  197  Pos RAST Trees/grass/ ragweed - cyclical gerd rx  07/18/2017 >  95% improved 08/06/2017 > continue gerd rx plus 1st gen H1 blockers per guidelines     - Spirometry 09/03/2017  wnl with nl curvature to f/v loop  - added singulair 10 mg daily 09/03/2017 and rec complete the gi w/u before next steps  Since she has not tried singulair and already failed flonase rec she try this while waiting to complete the gi w/u and in meantime be more aggressive with h1 esp if sinus ct neg (pending)    I had an extended discussion with the patient reviewing all relevant studies completed to date and  lasting 15 to 20 minutes of a 25 minute visit    Each maintenance medication was reviewed in detail including most importantly the difference between maintenance and prns and under what circumstances the prns are to be triggered using an action plan format that is not reflected in the computer generated alphabetically organized AVS.    Please see AVS for specific instructions unique to this visit that I personally wrote and verbalized to the the pt in detail and then reviewed with pt  by my nurse highlighting any  changes in therapy recommended at today's visit to their plan of care.     Marland Kitchen

## 2017-09-03 NOTE — Progress Notes (Signed)
Subjective:     Jean Hernandez: Jean Hernandez, female   DOB: 01-24-1971,    MRN: [id number]     Brief Jean profile:  40 yowf  Never smoker healthy then around 2008 while in [place] variable pattern of intermittnent   sneezing/ cough > allergy eval > shots x a couple months no better changed so just   flonase / zyrtec until 2010 joint stiffness abrupt dx with RA per Nickola Major then cough worse/daily since   around 2012  Then severe  to point of vomiting around  2014  With barium swallow done around 2016 and nl gastric emptying  and Bates eval 12/27/16 >   Omeprazole bid and no change so eval by GI  May 2018 rec manometry / egd and deferred by pt who self referred to pulmonary clinic 07/18/2017     History of Present Illness  07/18/2017 1st  Pulmonary office visit/ [facility]   Chief Complaint  Jean presents with  . Pulmoary Consult    Self referral. Pt c/o cough and SOB for the past 2 yrs, worse for the past 3 wks. Her cough bothers her all day and wakes her up in the night. She coughs until she vomits ands has also been developing HA. She gets SOB with walking long distances and also when she talks.   if not coughing breathing is fine  Cough Previously not bothering her as much sleeping as daytime but now waking her up/ does better rotated on side  In am coughs up scant  white mucus but really not excess/ purulent sputum or mucus plugs   Anoro x one year no better  Prednisone may help / took pred up to 20 mg prn   Kouffman Reflux v Neurogenic Cough Differentiator Reflux Comments  Do you awaken from a sound sleep coughing violently?                            With trouble breathing? Sometimes yes   Do you have choking episodes when you cannot  Get enough air, gasping for air ?              Yes   Do you usually cough when you lie down into  The bed, or when you just lie down to rest ?                          no   Do you usually cough after meals or eating?         no   Do you cough when (or  after) you bend over?    no   GERD SCORE     Kouffman Reflux v Neurogenic Cough Differentiator Neurogenic   Do you more-or-less cough all day long? All day    Does change of temperature make you cough? Hot muggy   Does laughing or chuckling cause you to cough? no   Do fumes (perfume, automobile fumes, burned  Toast, etc.,) cause you to cough ?      smoke   Does speaking, singing, or talking on the phone cause you to cough   ?               Def speaking   Neurogenic/Airway score     Not limited by breathing from desired activities  Unless coughing rec  First take delsym two tsp every 12 hours and supplement if needed with  Demerol  50 mg up to 1every 4 hours to suppress the urge to cough at all or even clear your throat. Swallowing water or using ice chips/non mint and menthol containing candies (such as lifesavers or sugarless jolly ranchers) are also effective.  You should rest your voice and avoid activities that you know make you cough. Once you have eliminated the cough for 3 straight days try reducing the demerol first,  then the delsym as tolerated.   Prednisone 10 mg take  4 each am x 2 days,   2 each am x 2 days,  1 each am x 2 days and stop (this is to eliminate allergies and inflammation from coughing) For drainage / throat tickle try take CHLORPHENIRAMINE  4 mg - take one every 4 hours as needed - available over the counter- may cause drowsiness so start with just a bedtime dose or two and see how you tolerate it before trying in daytime   Protonix (pantoprazole) Take 30-60 min before first meal of the day and Pepcid 20 mg one bedtime plus chlorpheniramine 4 mg x 2 at bedtime (both available over the counter)  until cough is completely gone for at least a week without the need for cough suppression GERD  Diet       08/06/2017  f/u ov/Susumu Hackler re: uacs  Chief Complaint  Jean presents with  . Follow-up    Cough has improved some but has not resolved. She is occ coughing up some  clear sputum.    ate sausage night before ov > cough to vomit / otherwise 95% improved an no longer needing demerol or deslym rec No change in recommendations including using over the counter delsym and chlortrimeton as needed  See your gi doctor at your convenience re reflux options < did not do Prednisone x 6 days prn flare  > did not feel she needed   09/03/2017  f/u ov/Alexis Mizuno re: cough x 2012  Chief Complaint  Jean presents with  . Follow-up    Pt states that her cough is some better, but has not resolved.   onset hacking p stirs w/in 5 min/ does not wake her -  Still rates herself as 95 % better  Nasal congestion also worse in am's but no correlation with pos RAST (trees/grass) in terms of severity spring vs winter Not limited by breathing from desired activities   Still occ hb though on ppi q am and h2 hs consistently    No obvious day to day or daytime variability or assoc excess/ purulent sputum or mucus plugs or hemoptysis or cp or chest tightness, subjective wheeze or overt sinus or hb symptoms. No unusual exp hx or h/o childhood pna/ asthma or knowledge of premature birth.  Sleeping ok flat without nocturnal  or early am exacerbation  of respiratory  c/o's or need for noct saba. Also denies any obvious fluctuation of symptoms with weather or environmental changes or other aggravating or alleviating factors except as outlined above   Current Allergies, Complete Past Medical History, Past Surgical History, Family History, and Social History were reviewed in [employer] record.  ROS  The following are not active complaints unless bolded Hoarseness, sore throat, dysphagia, dental problems, itching, sneezing,  nasal congestion or discharge of excess mucus or purulent secretions, ear ache,   fever, chills, sweats, unintended wt loss or wt gain, classically pleuritic or exertional cp,  orthopnea pnd or leg swelling, presyncope, palpitations, abdominal pain,  anorexia, nausea, vomiting,  diarrhea  or change in bowel habits or change in bladder habits, change in stools or change in urine, dysuria, hematuria,  rash, arthralgias, visual complaints, headache, numbness, weakness or ataxia or problems with walking or coordination,  change in mood/affect or memory.        Current Meds  Medication Sig  . Calcium Citrate (CITRACAL PO) Take 1 tablet by mouth 2 (two) times daily.  . chlorpheniramine (CHLOR-TRIMETON) 4 MG tablet Take 8 mg by mouth at bedtime. And as needed during the day every 4 hours  . etanercept (ENBREL) 25 MG injection Inject 50 mg into the skin once a week.   . famotidine (PEPCID) 20 MG tablet One at bedtime  . leflunomide (ARAVA) 20 MG tablet Take 20 mg by mouth daily.  Marland Kitchen loperamide (IMODIUM) 2 MG capsule Take 2 mg by mouth as needed for diarrhea or loose stools.  . naproxen (NAPROSYN) 500 MG tablet Take 500 mg by mouth as needed.  . pantoprazole (PROTONIX) 40 MG tablet Take 1 tablet (40 mg total) by mouth daily. Take 30-60 min before first meal of the day                       Objective:   Physical Exam    Obese amb     09/03/2017      225   08/06/2017     221   07/18/17 220 lb (99.8 kg)  04/22/17 219 lb (99.3 kg)  04/01/17 222 lb 9.6 oz (101 kg)    Vital signs reviewed  - Note on arrival 02 sats 93 % on RA      HEENT: nl dentition  bilaterally, and oropharynx. Nl external ear canals without cough reflex - moderate bilateral non-specific turbinate edema       HEENT: nl dentition, turbinates bilaterally, and oropharynx. Nl external ear canals without cough reflex   NECK :  without JVD/Nodes/TM/ nl carotid upstrokes bilaterally   LUNGS: no acc muscle use,  Nl contour chest which is clear to A and P bilaterally without cough on insp or exp maneuvers   CV:  RRR  no s3 or murmur or increase in P2, and no edema   ABD:  soft and nontender with nl inspiratory excursion in the supine position. No bruits or  organomegaly appreciated, bowel sounds nl  MS:  Nl gait/ ext warm without deformities, calf tenderness, cyanosis or clubbing No obvious joint restrictions   SKIN: warm and dry without lesions    NEURO:  alert, approp, nl sensorium with  no motor or cerebellar deficits apparent.      CXR PA and Lateral:   07/18/2017 :    I personally reviewed images and   impression as follows:   Mild non specific increased markings                 Assessment:

## 2017-09-03 NOTE — Progress Notes (Signed)
PFT done today. 

## 2017-09-12 ENCOUNTER — Ambulatory Visit (INDEPENDENT_AMBULATORY_CARE_PROVIDER_SITE_OTHER)
Admission: RE | Admit: 2017-09-12 | Discharge: 2017-09-12 | Disposition: A | Discharge status: Home / Self Care | Payer: Federal, State, Local not specified - PPO | Source: Ambulatory Visit | Attending: Internal Medicine | Admitting: Internal Medicine

## 2017-09-12 DIAGNOSIS — R05 Cough: Secondary | ICD-10-CM | POA: Diagnosis not present

## 2017-09-12 DIAGNOSIS — R059 Cough, unspecified: Secondary | ICD-10-CM

## 2017-09-12 DIAGNOSIS — R058 Other specified cough: Secondary | ICD-10-CM

## 2017-10-04 ENCOUNTER — Other Ambulatory Visit: Payer: Self-pay | Admitting: Family

## 2017-10-04 DIAGNOSIS — J209 Acute bronchitis, unspecified: Secondary | ICD-10-CM

## 2017-10-10 ENCOUNTER — Encounter: Payer: Self-pay | Admitting: Internal Medicine

## 2017-10-10 ENCOUNTER — Ambulatory Visit: Payer: Federal, State, Local not specified - PPO | Admitting: Internal Medicine

## 2017-10-10 VITALS — BP 124/72 | HR 74 | Ht 67.0 in | Wt 224.0 lb

## 2017-10-10 DIAGNOSIS — R05 Cough: Secondary | ICD-10-CM | POA: Diagnosis not present

## 2017-10-10 DIAGNOSIS — R058 Other specified cough: Secondary | ICD-10-CM

## 2017-10-10 NOTE — Patient Instructions (Addendum)
For drainage / throat tickle try take CHLORPHENIRAMINE  4 mg - take up to one every 4 hours as needed - available over the counter- may cause drowsiness so start with just a bedtime dose or two and see how you tolerate it before trying in daytime      Please schedule a follow up visit in 3 months but call sooner if needed

## 2017-10-10 NOTE — Progress Notes (Signed)
Subjective:     Patient ID: Jean Hernandez, female   DOB: 01-24-1971,    MRN: [id number]     Brief patient profile:  40 yowf  Never smoker healthy then around 2008 while in [place] variable pattern of intermittnent   sneezing/ cough > allergy eval > shots x a couple months no better changed so just   flonase / zyrtec until 2010 joint stiffness abrupt dx with RA per Nickola Major then cough worse/daily since   around 2012  Then severe  to point of vomiting around  2014  With barium swallow done around 2016 and nl gastric emptying  and Bates eval 12/27/16 >   Omeprazole bid and no change so eval by GI  May 2018 rec manometry / egd and deferred by pt who self referred to pulmonary clinic 07/18/2017     History of Present Illness  07/18/2017 1st  Pulmonary office visit/ [place]   Chief Complaint  Patient presents with  . Pulmoary Consult    Self referral. Pt c/o cough and SOB for the past 2 yrs, worse for the past 3 wks. Her cough bothers her all day and wakes her up in the night. She coughs until she vomits ands has also been developing HA. She gets SOB with walking long distances and also when she talks.   if not coughing breathing is fine  Cough Previously not bothering her as much sleeping as daytime but now waking her up/ does better rotated on side  In am coughs up scant  white mucus but really not excess/ purulent sputum or mucus plugs   Anoro x one year no better  Prednisone may help / took pred up to 20 mg prn   Kouffman Reflux v Neurogenic Cough Differentiator Reflux Comments  Do you awaken from a sound sleep coughing violently?                            With trouble breathing? Sometimes yes   Do you have choking episodes when you cannot  Get enough air, gasping for air ?              Yes   Do you usually cough when you lie down into  The bed, or when you just lie down to rest ?                          no   Do you usually cough after meals or eating?         no   Do you cough when (or  after) you bend over?    no   GERD SCORE     Kouffman Reflux v Neurogenic Cough Differentiator Neurogenic   Do you more-or-less cough all day long? All day    Does change of temperature make you cough? Hot muggy   Does laughing or chuckling cause you to cough? no   Do fumes (perfume, automobile fumes, burned  Toast, etc.,) cause you to cough ?      smoke   Does speaking, singing, or talking on the phone cause you to cough   ?               Def speaking   Neurogenic/Airway score     Not limited by breathing from desired activities  Unless coughing rec  First take delsym two tsp every 12 hours and supplement if needed with  Demerol  50 mg up to 1every 4 hours to suppress the urge to cough at all or even clear your throat. Swallowing water or using ice chips/non mint and menthol containing candies (such as lifesavers or sugarless jolly ranchers) are also effective.  You should rest your voice and avoid activities that you know make you cough. Once you have eliminated the cough for 3 straight days try reducing the demerol first,  then the delsym as tolerated.   Prednisone 10 mg take  4 each am x 2 days,   2 each am x 2 days,  1 each am x 2 days and stop (this is to eliminate allergies and inflammation from coughing) For drainage / throat tickle try take CHLORPHENIRAMINE  4 mg - take one every 4 hours as needed - available over the counter- may cause drowsiness so start with just a bedtime dose or two and see how you tolerate it before trying in daytime   Protonix (pantoprazole) Take 30-60 min before first meal of the day and Pepcid 20 mg one bedtime plus chlorpheniramine 4 mg x 2 at bedtime (both available over the counter)  until cough is completely gone for at least a week without the need for cough suppression GERD  Diet       08/06/2017  f/u ov/Kenedi Cilia re: uacs  Chief Complaint  Patient presents with  . Follow-up    Cough has improved some but has not resolved. She is occ coughing up some  clear sputum.    ate sausage night before ov > cough to vomit / otherwise 95% improved an no longer needing demerol or deslym rec No change in recommendations including using over the counter delsym and chlortrimeton as needed  See your gi doctor at your convenience re reflux options > did not do Prednisone x 6 days prn flare  > did not feel she needed   09/03/2017  f/u ov/Ivah Girardot re: cough x 2012  Chief Complaint  Patient presents with  . Follow-up    Pt states that her cough is some better, but has not resolved.   onset hacking p stirs w/in 5 min/ does not wake her -  Still rates herself as 95 % better  Nasal congestion also worse in am's but no correlation with pos RAST (trees/grass) in terms of severity spring vs winter Not limited by breathing from desired activities   Still occ hb though on ppi q am and h2 hs consistently rec Please see patient coordinator before you leave today  to schedule sinus CT  Singulair 10 mg at bedtime  For drainage / throat tickle try take CHLORPHENIRAMINE  4 mg - take one every 4 hours as needed - GERD diet   Keep appt to see Dr Loreta Ave to complete your GI work up  Please schedule a follow up office visit in 6 weeks, call sooner if needed  Add:  Consider trial of dymista for uacs/ pnds if not better from singulair trial       10/10/2017  f/u ov/Brien Lowe re: cough x 2012  Chief Complaint  Patient presents with  . Follow-up    coughing   best she's been in 6 years   Not limited by breathing from desired activities Only using h1 no more than bid and seems to help/ well tolerted at 4 mg bid  No obvious day to day or daytime variability or assoc excess/ purulent sputum or mucus plugs or hemoptysis or cp or chest tightness, subjective wheeze or  overt sinus or hb symptoms. No unusual exposure hx or h/o childhood pna/ asthma or knowledge of premature birth.  Sleeping ok flat without nocturnal  or early am exacerbation  of respiratory  c/o's or need for noct  saba. Also denies any obvious fluctuation of symptoms with weather or environmental changes or other aggravating or alleviating factors except as outlined above   Current Allergies, Complete Past Medical History, Past Surgical History, Family History, and Social History were reviewed in [employer] record.  ROS  The following are not active complaints unless bolded Hoarseness, sore throat, dysphagia, dental problems, itching, sneezing,  nasal congestion or discharge of excess mucus or purulent secretions, ear ache,   fever, chills, sweats, unintended wt loss or wt gain, classically pleuritic or exertional cp,  orthopnea pnd or leg swelling, presyncope, palpitations, abdominal pain, anorexia, nausea, vomiting, diarrhea  or change in bowel habits or change in bladder habits, change in stools or change in urine, dysuria, hematuria,  rash, arthralgias, visual complaints, headache, numbness, weakness or ataxia or problems with walking or coordination,  change in mood/affect or memory.        Current Meds  Medication Sig  . Calcium Citrate (CITRACAL PO) Take 1 tablet by mouth 2 (two) times daily.  . chlorpheniramine (CHLOR-TRIMETON) 4 MG tablet Take 8 mg by mouth at bedtime. And as needed during the day every 4 hours  . etanercept (ENBREL) 25 MG injection Inject 50 mg into the skin once a week.   . famotidine (PEPCID) 20 MG tablet One at bedtime  . fluticasone (FLONASE) 50 MCG/ACT nasal spray Place 2 sprays into both nostrils daily.  Marland Kitchen leflunomide (ARAVA) 20 MG tablet Take 20 mg by mouth daily.  Marland Kitchen loperamide (IMODIUM) 2 MG capsule Take 2 mg by mouth as needed for diarrhea or loose stools.  . montelukast (SINGULAIR) 10 MG tablet Take 1 tablet (10 mg total) at bedtime by mouth.  . naproxen (NAPROSYN) 500 MG tablet Take 500 mg by mouth as needed.  . pantoprazole (PROTONIX) 40 MG tablet Take 1 tablet (40 mg total) by mouth daily. Take 30-60 min before first meal of the day  .  Teriparatide, Recombinant, (FORTEO) 600 MCG/2.4ML SOLN Inject 20 mcg into the skin daily.                           Objective:   Physical Exam    Pleasant wf nad   10/10/2017      224  09/03/2017      225   08/06/2017     221   07/18/17 220 lb (99.8 kg)  04/22/17 219 lb (99.3 kg)  04/01/17 222 lb 9.6 oz (101 kg)    Vital signs reviewed - Note on arrival 02 sats  98% on RA    HEENT: nl dentition, turbinates bilaterally, and oropharynx. Nl external ear canals without cough reflex   NECK :  without JVD/Nodes/TM/ nl carotid upstrokes bilaterally   LUNGS: no acc muscle use,  Nl contour chest which is clear to A and P bilaterally without cough on insp or exp maneuvers   CV:  RRR  no s3 or murmur or increase in P2, and no edema   ABD:  soft and nontender with nl inspiratory excursion in the supine position. No bruits or organomegaly appreciated, bowel sounds nl  MS:  Nl gait/ ext warm without deformities, calf tenderness, cyanosis or clubbing No obvious joint restrictions  SKIN: warm and dry without lesions    NEURO:  alert, approp, nl sensorium with  no motor or cerebellar deficits apparent.                                Assessment:

## 2017-10-17 ENCOUNTER — Encounter: Payer: Self-pay | Admitting: Internal Medicine

## 2017-10-17 NOTE — Assessment & Plan Note (Addendum)
Onset 2008 with assoc rhinitis> ? failed allergy shots in [place] - Allergy profile 07/18/2017 >  Eos 0.0 /  IgE  197  Pos RAST Trees/grass/ ragweed - cyclical gerd rx  07/18/2017 >  95% improved 08/06/2017 > continue gerd rx plus 1st gen H1 blockers per guidelines   - Spirometry 09/03/2017  wnl with nl curvature to f/v loop  - added singulair 10 mg daily 09/03/2017 and rec complete the gi w/u before next steps - Sinus CT 09/12/2017 > neg   - 10/10/2017 improved on H1 bid / gerd rx / singulair   Adequate control on present rx, reviewed in detail with pt > no change in rx needed  - esp since she'd been coughing daily x years prior to these interventions   rec same rx x 3 m then regroup  Each maintenance medication was reviewed in detail including most importantly the difference between maintenance and as needed and under what circumstances the prns are to be used.  Please see AVS for specific  Instructions which are unique to this visit and I personally typed out  which were reviewed in detail in writing with the patient and a copy provided.

## 2017-11-06 ENCOUNTER — Other Ambulatory Visit: Payer: Self-pay | Admitting: Internal Medicine

## 2017-11-06 DIAGNOSIS — R05 Cough: Secondary | ICD-10-CM

## 2017-11-06 DIAGNOSIS — R058 Other specified cough: Secondary | ICD-10-CM

## 2017-11-20 DIAGNOSIS — M2550 Pain in unspecified joint: Secondary | ICD-10-CM | POA: Diagnosis not present

## 2017-11-20 DIAGNOSIS — M170 Bilateral primary osteoarthritis of knee: Secondary | ICD-10-CM | POA: Diagnosis not present

## 2017-11-20 DIAGNOSIS — Z79899 Other long term (current) drug therapy: Secondary | ICD-10-CM | POA: Diagnosis not present

## 2017-11-20 DIAGNOSIS — M0579 Rheumatoid arthritis with rheumatoid factor of multiple sites without organ or systems involvement: Secondary | ICD-10-CM | POA: Diagnosis not present

## 2017-12-28 ENCOUNTER — Telehealth: Payer: Self-pay | Admitting: Family Medicine

## 2017-12-28 ENCOUNTER — Other Ambulatory Visit: Payer: Self-pay | Admitting: Family Medicine

## 2017-12-28 MED ORDER — ONDANSETRON 8 MG PO TBDP: 8.0000 mg | ORAL_TABLET | Freq: Four times a day (QID) | ORAL | 1 refills | Status: DC | PRN

## 2017-12-28 MED ORDER — OSELTAMIVIR PHOSPHATE 75 MG PO CAPS: 75.0000 mg | ORAL_CAPSULE | Freq: Every day | ORAL | 0 refills | Status: DC

## 2017-12-28 NOTE — Telephone Encounter (Signed)
Please advise 

## 2017-12-28 NOTE — Telephone Encounter (Signed)
I sent in the requested prescription 

## 2017-12-28 NOTE — Telephone Encounter (Signed)
Patient notified

## 2018-01-02 ENCOUNTER — Other Ambulatory Visit: Payer: Self-pay | Admitting: Internal Medicine

## 2018-01-09 ENCOUNTER — Ambulatory Visit: Payer: Federal, State, Local not specified - PPO | Admitting: Internal Medicine

## 2018-01-20 DIAGNOSIS — K219 Gastro-esophageal reflux disease without esophagitis: Secondary | ICD-10-CM | POA: Diagnosis not present

## 2018-02-04 ENCOUNTER — Encounter: Payer: Self-pay | Admitting: Internal Medicine

## 2018-02-04 ENCOUNTER — Ambulatory Visit (INDEPENDENT_AMBULATORY_CARE_PROVIDER_SITE_OTHER): Payer: Federal, State, Local not specified - PPO | Admitting: Internal Medicine

## 2018-02-04 VITALS — BP 136/82 | HR 105 | Ht 67.0 in | Wt 223.0 lb

## 2018-02-04 DIAGNOSIS — R05 Cough: Secondary | ICD-10-CM

## 2018-02-04 DIAGNOSIS — R058 Other specified cough: Secondary | ICD-10-CM

## 2018-02-04 NOTE — Progress Notes (Signed)
Subjective:     Patient ID: Jean Hernandez, female   DOB: 01-24-1971,    MRN: [id number]     Brief patient profile:  40 yowf  Never smoker healthy then around 2008 while in [place] variable pattern of intermittnent   sneezing/ cough > allergy eval > shots x a couple months no better changed so just   flonase / zyrtec until 2010 joint stiffness abrupt dx with RA per Nickola Major then cough worse/daily since   around 2012  Then severe  to point of vomiting around  2014  With barium swallow done around 2016 and nl gastric emptying  and Bates eval 12/27/16 >   Omeprazole bid and no change so eval by GI  May 2018 rec manometry / egd and deferred by pt who self referred to pulmonary clinic 07/18/2017     History of Present Illness  07/18/2017 1st  Pulmonary office visit/ Jean Hernandez   Chief Complaint  Patient presents with  . Pulmoary Consult    Self referral. Pt c/o cough and SOB for the past 2 yrs, worse for the past 3 wks. Her cough bothers her all day and wakes her up in the night. She coughs until she vomits ands has also been developing HA. She gets SOB with walking long distances and also when she talks.   if not coughing breathing is fine  Cough Previously not bothering her as much sleeping as daytime but now waking her up/ does better rotated on side  In am coughs up scant  white mucus but really not excess/ purulent sputum or mucus plugs   Anoro x one year no better  Prednisone may help / took pred up to 20 mg prn   Kouffman Reflux v Neurogenic Cough Differentiator Reflux Comments  Do you awaken from a sound sleep coughing violently?                            With trouble breathing? Sometimes yes   Do you have choking episodes when you cannot  Get enough air, gasping for air ?              Yes   Do you usually cough when you lie down into  The bed, or when you just lie down to rest ?                          no   Do you usually cough after meals or eating?         no   Do you cough when (or  after) you bend over?    no   GERD SCORE     Kouffman Reflux v Neurogenic Cough Differentiator Neurogenic   Do you more-or-less cough all day long? All day    Does change of temperature make you cough? Hot muggy   Does laughing or chuckling cause you to cough? no   Do fumes (perfume, automobile fumes, burned  Toast, etc.,) cause you to cough ?      smoke   Does speaking, singing, or talking on the phone cause you to cough   ?               Def speaking   Neurogenic/Airway score     Not limited by breathing from desired activities  Unless coughing rec  First take delsym two tsp every 12 hours and supplement if needed with  Demerol  50 mg up to 1every 4 hours to suppress the urge to cough at all or even clear your throat. Swallowing water or using ice chips/non mint and menthol containing candies (such as lifesavers or sugarless jolly ranchers) are also effective.  You should rest your voice and avoid activities that you know make you cough. Once you have eliminated the cough for 3 straight days try reducing the demerol first,  then the delsym as tolerated.   Prednisone 10 mg take  4 each am x 2 days,   2 each am x 2 days,  1 each am x 2 days and stop (this is to eliminate allergies and inflammation from coughing) For drainage / throat tickle try take CHLORPHENIRAMINE  4 mg - take one every 4 hours as needed - available over the counter- may cause drowsiness so start with just a bedtime dose or two and see how you tolerate it before trying in daytime   Protonix (pantoprazole) Take 30-60 min before first meal of the day and Pepcid 20 mg one bedtime plus chlorpheniramine 4 mg x 2 at bedtime (both available over the counter)  until cough is completely gone for at least a week without the need for cough suppression GERD  Diet       08/06/2017  f/u ov/Jean Hernandez re: uacs  Chief Complaint  Patient presents with  . Follow-up    Cough has improved some but has not resolved. She is occ coughing up some  clear sputum.    ate sausage night before ov > cough to vomit / otherwise 95% improved an no longer needing demerol or deslym rec No change in recommendations including using over the counter delsym and chlortrimeton as needed  See your gi doctor at your convenience re reflux options > did not do Prednisone x 6 days prn flare  > did not feel she needed   09/03/2017  f/u ov/Jean Hernandez re: cough x 2012  Chief Complaint  Patient presents with  . Follow-up    Pt states that her cough is some better, but has not resolved.   onset hacking p stirs w/in 5 min/ does not wake her -  Still rates herself as 95 % better  Nasal congestion also worse in am's but no correlation with pos RAST (trees/grass) in terms of severity spring vs winter Not limited by breathing from desired activities   Still occ hb though on ppi q am and h2 hs consistently rec Please see patient coordinator before you leave today  to schedule sinus CT  Singulair 10 mg at bedtime  For drainage / throat tickle try take CHLORPHENIRAMINE  4 mg - take one every 4 hours as needed - GERD diet   Keep appt to see Dr Loreta Ave to complete your GI work up  Please schedule a follow up office visit in 6 weeks, call sooner if needed  Add:  Consider trial of dymista for uacs/ pnds if not better from singulair trial       10/10/2017  f/u ov/Jean Hernandez re: cough x 2012  Chief Complaint  Patient presents with  . Follow-up    coughing   best she's been in 6 years   Not limited by breathing from desired activities Only using h1 no more than bid and seems to help/ well tolerted at 4 mg bid rec For drainage / throat tickle try take CHLORPHENIRAMINE  4 mg - take up to one every 4 hours as needed -  02/04/2018  f/u ov/Jean Hernandez re:  UACS/ allergic rhinitis  In setting of RA Chief Complaint  Patient presents with  . Follow-up    Pt states "cough is pretty good"- progressively better since the last visit.   Dyspnea:  MMRC1 = can walk nl pace, flat grade,  can't hurry or go uphills or steps s sob   Cough: gone as long as take 1st gen H1 blockers per guidelines   Sleep: insomnia/ chronic, ever p 2 h1s  SABA use:  None   Vomiting once  A week ? Related to eating always heralded by throat clearing first > [facility] plans w/u   No obvious day to day or daytime variability or assoc excess/ purulent sputum or mucus plugs or hemoptysis or cp or chest tightness, subjective wheeze or overt sinus or hb symptoms. No unusual exposure hx or h/o childhood pna/ asthma or knowledge of premature birth.  Sleeping  Ok resp wise  without nocturnal  or early am exacerbation  of respiratory  c/o's or need for noct saba. Also denies any obvious fluctuation of symptoms with weather or environmental changes or other aggravating or alleviating factors except as outlined above   Current Allergies, Complete Past Medical History, Past Surgical History, Family History, and Social History were reviewed in [employer] record.  ROS  The following are not active complaints unless bolded Hoarseness, sore throat, dysphagia, dental problems, itching, sneezing,  nasal congestion or discharge of excess mucus or purulent secretions, ear ache,   fever, chills, sweats, unintended wt loss or wt gain, classically pleuritic or exertional cp,  orthopnea pnd or arm/hand swelling  or leg swelling, presyncope, palpitations, abdominal pain, anorexia, nausea, vomiting, diarrhea  or change in bowel habits or change in bladder habits, change in stools or change in urine, dysuria, hematuria,  rash, arthralgias, visual complaints, headache, numbness, weakness or ataxia or problems with walking or coordination,  change in mood or  memory.        Current Meds  Medication Sig  . Calcium Citrate (CITRACAL PO) Take 1 tablet by mouth 2 (two) times daily.  . chlorpheniramine (CHLOR-TRIMETON) 4 MG tablet Take 8 mg by mouth at bedtime. And as needed during the day every 4 hours  .  etanercept (ENBREL) 25 MG injection Inject 50 mg into the skin once a week.   . famotidine (PEPCID) 20 MG tablet TAKE ONE TABLET AT BEDTIME  . fluticasone (FLONASE) 50 MCG/ACT nasal spray Place 2 sprays into both nostrils daily.  Marland Kitchen leflunomide (ARAVA) 20 MG tablet Take 20 mg by mouth daily.  Marland Kitchen loperamide (IMODIUM) 2 MG capsule Take 2 mg by mouth as needed for diarrhea or loose stools.  . montelukast (SINGULAIR) 10 MG tablet Take 1 tablet (10 mg total) at bedtime by mouth.  . naproxen (NAPROSYN) 500 MG tablet Take 500 mg by mouth as needed.  . pantoprazole (PROTONIX) 40 MG tablet TAKE 1 TABLET ONCE DAILY 30 TO 60 MINUTES BEFORE FIRST MEAL OF THE DAY (Patient taking differently: 1 tablet twice daily)                Objective:   Physical Exam    amb wf nad    02/04/2018        223  10/10/2017      224  09/03/2017      225   08/06/2017     221   07/18/17 220 lb (99.8 kg)  04/22/17 219 lb (99.3 kg)  04/01/17  222 lb 9.6 oz (101 kg)      Vital signs reviewed - Note on arrival 02 sats  95% on RA         HEENT: nl dentition,  and oropharynx. Nl external ear canals without cough reflex - moderate bilateral non-specific turbinate edema     NECK :  without JVD/Nodes/TM/ nl carotid upstrokes bilaterally   LUNGS: no acc muscle use,  Nl contour chest which is clear to A and P bilaterally without cough on insp or exp maneuvers   CV:  RRR  no s3 or murmur or increase in P2, and no edema   ABD:  soft and nontender with nl inspiratory excursion in the supine position. No bruits or organomegaly appreciated, bowel sounds nl  MS:  Nl gait/ ext warm without deformities, calf tenderness, cyanosis or clubbing No obvious joint restrictions   SKIN: warm and dry without lesions    NEURO:  alert, approp, nl sensorium with  no motor or cerebellar deficits apparent.                     Assessment:

## 2018-02-04 NOTE — Assessment & Plan Note (Addendum)
Onset 2008 with assoc rhinitis> ? failed allergy shots in [place] - Allergy profile 07/18/2017 >  Eos 0.0 /  IgE  197  Pos RAST Trees/grass/ ragweed - cyclical gerd rx  07/18/2017 >  95% improved 08/06/2017 > continue gerd rx plus 1st gen H1 blockers per guidelines   - Spirometry 09/03/2017  wnl with nl curvature to f/v loop  - added singulair 10 mg daily 09/03/2017 and rec complete the gi w/u before next steps - Sinus CT 09/12/2017 > neg  - 10/10/2017 improved on H1 bid and singulair combined   Marginal  control on present rx, reviewed in detail with pt > no change in rx needed  - given atopic pattern would rec indefinite singulair and adjust h1 up to as high as q4 h prn and change flonase to dymista on a trial basis only    Each maintenance medication was reviewed in detail including most importantly the difference between maintenance and as needed and under what circumstances the prns are to be used.  Please see AVS for specific  Instructions which are unique to this visit and I personally typed out  which were reviewed in detail in writing with the patient and a copy provided.

## 2018-02-04 NOTE — Patient Instructions (Addendum)
No change in medicines for now  But ok to try dysmista one twice daily in place of flonase and call if note any improvement    Please schedule a follow up visit in 12  months but call sooner if needed

## 2018-02-13 DIAGNOSIS — M9903 Segmental and somatic dysfunction of lumbar region: Secondary | ICD-10-CM | POA: Diagnosis not present

## 2018-02-13 DIAGNOSIS — M5432 Sciatica, left side: Secondary | ICD-10-CM | POA: Diagnosis not present

## 2018-02-13 DIAGNOSIS — M9902 Segmental and somatic dysfunction of thoracic region: Secondary | ICD-10-CM | POA: Diagnosis not present

## 2018-02-17 DIAGNOSIS — M9903 Segmental and somatic dysfunction of lumbar region: Secondary | ICD-10-CM | POA: Diagnosis not present

## 2018-02-17 DIAGNOSIS — M5432 Sciatica, left side: Secondary | ICD-10-CM | POA: Diagnosis not present

## 2018-02-17 DIAGNOSIS — M9902 Segmental and somatic dysfunction of thoracic region: Secondary | ICD-10-CM | POA: Diagnosis not present

## 2018-02-19 DIAGNOSIS — M9903 Segmental and somatic dysfunction of lumbar region: Secondary | ICD-10-CM | POA: Diagnosis not present

## 2018-02-19 DIAGNOSIS — M5432 Sciatica, left side: Secondary | ICD-10-CM | POA: Diagnosis not present

## 2018-02-19 DIAGNOSIS — M9902 Segmental and somatic dysfunction of thoracic region: Secondary | ICD-10-CM | POA: Diagnosis not present

## 2018-02-20 DIAGNOSIS — M9902 Segmental and somatic dysfunction of thoracic region: Secondary | ICD-10-CM | POA: Diagnosis not present

## 2018-02-20 DIAGNOSIS — M5432 Sciatica, left side: Secondary | ICD-10-CM | POA: Diagnosis not present

## 2018-02-20 DIAGNOSIS — M9903 Segmental and somatic dysfunction of lumbar region: Secondary | ICD-10-CM | POA: Diagnosis not present

## 2018-02-24 DIAGNOSIS — M9902 Segmental and somatic dysfunction of thoracic region: Secondary | ICD-10-CM | POA: Diagnosis not present

## 2018-02-24 DIAGNOSIS — M9903 Segmental and somatic dysfunction of lumbar region: Secondary | ICD-10-CM | POA: Diagnosis not present

## 2018-02-24 DIAGNOSIS — M5432 Sciatica, left side: Secondary | ICD-10-CM | POA: Diagnosis not present

## 2018-02-26 DIAGNOSIS — M9903 Segmental and somatic dysfunction of lumbar region: Secondary | ICD-10-CM | POA: Diagnosis not present

## 2018-02-26 DIAGNOSIS — M5432 Sciatica, left side: Secondary | ICD-10-CM | POA: Diagnosis not present

## 2018-02-26 DIAGNOSIS — M9902 Segmental and somatic dysfunction of thoracic region: Secondary | ICD-10-CM | POA: Diagnosis not present

## 2018-03-03 DIAGNOSIS — M9902 Segmental and somatic dysfunction of thoracic region: Secondary | ICD-10-CM | POA: Diagnosis not present

## 2018-03-03 DIAGNOSIS — M9903 Segmental and somatic dysfunction of lumbar region: Secondary | ICD-10-CM | POA: Diagnosis not present

## 2018-03-03 DIAGNOSIS — M5432 Sciatica, left side: Secondary | ICD-10-CM | POA: Diagnosis not present

## 2018-03-05 DIAGNOSIS — M5432 Sciatica, left side: Secondary | ICD-10-CM | POA: Diagnosis not present

## 2018-03-05 DIAGNOSIS — M9902 Segmental and somatic dysfunction of thoracic region: Secondary | ICD-10-CM | POA: Diagnosis not present

## 2018-03-05 DIAGNOSIS — M9903 Segmental and somatic dysfunction of lumbar region: Secondary | ICD-10-CM | POA: Diagnosis not present

## 2018-03-17 DIAGNOSIS — M9902 Segmental and somatic dysfunction of thoracic region: Secondary | ICD-10-CM | POA: Diagnosis not present

## 2018-03-17 DIAGNOSIS — M5432 Sciatica, left side: Secondary | ICD-10-CM | POA: Diagnosis not present

## 2018-03-17 DIAGNOSIS — M9903 Segmental and somatic dysfunction of lumbar region: Secondary | ICD-10-CM | POA: Diagnosis not present

## 2018-03-24 DIAGNOSIS — M9903 Segmental and somatic dysfunction of lumbar region: Secondary | ICD-10-CM | POA: Diagnosis not present

## 2018-03-24 DIAGNOSIS — M9902 Segmental and somatic dysfunction of thoracic region: Secondary | ICD-10-CM | POA: Diagnosis not present

## 2018-03-24 DIAGNOSIS — M5432 Sciatica, left side: Secondary | ICD-10-CM | POA: Diagnosis not present

## 2018-03-26 DIAGNOSIS — M818 Other osteoporosis without current pathological fracture: Secondary | ICD-10-CM | POA: Diagnosis not present

## 2018-03-26 DIAGNOSIS — M170 Bilateral primary osteoarthritis of knee: Secondary | ICD-10-CM | POA: Diagnosis not present

## 2018-03-26 DIAGNOSIS — M0579 Rheumatoid arthritis with rheumatoid factor of multiple sites without organ or systems involvement: Secondary | ICD-10-CM | POA: Diagnosis not present

## 2018-03-26 DIAGNOSIS — M2550 Pain in unspecified joint: Secondary | ICD-10-CM | POA: Diagnosis not present

## 2018-03-31 ENCOUNTER — Ambulatory Visit: Payer: Federal, State, Local not specified - PPO | Admitting: Family Medicine

## 2018-03-31 ENCOUNTER — Encounter: Payer: Self-pay | Admitting: Family Medicine

## 2018-03-31 VITALS — BP 128/88 | HR 86 | Temp 99.5°F | Ht 67.0 in | Wt 228.2 lb

## 2018-03-31 DIAGNOSIS — M0579 Rheumatoid arthritis with rheumatoid factor of multiple sites without organ or systems involvement: Secondary | ICD-10-CM

## 2018-03-31 DIAGNOSIS — E669 Obesity, unspecified: Secondary | ICD-10-CM

## 2018-03-31 MED ORDER — PHENTERMINE HCL 37.5 MG PO TABS: 37.5000 mg | ORAL_TABLET | Freq: Every day | ORAL | 2 refills | Status: DC

## 2018-03-31 NOTE — Patient Instructions (Signed)

## 2018-03-31 NOTE — Progress Notes (Signed)
Subjective:  Patient ID: Jean Hernandez, female    DOB: 1970/12/06  Age: 47 y.o. MRN: [id number]  CC: Weight Loss (pt here today to discuss weight loss )   HPI Jean Hernandez presents for concern about her weight.  She is been unable to lose in spite of of multiple efforts.  This time she has a diet program with a app for a diet diary.  She has a fit bit to help her count her steps and a partner to help her walk regularly.  She is looking at a high-protein low-carb diet.  She is concerned that this is coming from her rheumatoid arthritis and its treatment and in turn as she gains weight is making her rheumatoid arthritis worse.  Depression screen [facility] 2/9 03/31/2018 08/26/2017 04/22/2017  Decreased Interest 0 0 1  Down, Depressed, Hopeless 0 0 0  PHQ - 2 Score 0 0 1    History Jean Hernandez has a past medical history of Rheumatoid arthritis ([place]).   She has a past surgical history that includes C sections; colonsocopy (2012); and Abdominal hysterectomy.   Her family history includes Allergies in her father, mother, and sister; Dementia in her father; Diabetes in her father; Early death in her father; Heart disease in her father; Hypertension in her mother.She reports that she has never smoked. She has never used smokeless tobacco. She reports that she does not drink alcohol or use drugs.    ROS Review of Systems  Constitutional: Negative.   HENT: Negative.   Eyes: Negative for visual disturbance.  Respiratory: Negative for shortness of breath.   Cardiovascular: Negative for chest pain.  Gastrointestinal: Negative for abdominal pain.  Musculoskeletal: Positive for arthralgias.    Objective:  BP 128/88   Pulse 86   Temp 99.5 F (37.5 C) (Oral)   Ht _0  (1.702 m)   Wt 228 lb 4 oz (103.5 kg)   BMI 35.75 kg/m   BP Readings from Last 3 Encounters:  03/31/18 128/88  02/04/18 136/82  10/10/17 124/72    Wt Readings from Last 3 Encounters:  03/31/18 228 lb 4 oz (103.5 kg)  02/04/18 223  lb (101.2 kg)  10/10/17 224 lb (101.6 kg)     Physical Exam  Constitutional: She is oriented to person, place, and time. She appears well-developed and well-nourished. No distress.  Cardiovascular: Normal rate and regular rhythm.  Pulmonary/Chest: Breath sounds normal.  Musculoskeletal: Normal range of motion.  Neurological: She is alert and oriented to person, place, and time.  Skin: Skin is warm and dry.  Psychiatric: She has a normal mood and affect.      Assessment & Plan:   Jean Hernandez was seen today for weight loss.  Diagnoses and all orders for this visit:  Rheumatoid arthritis involving multiple sites with positive rheumatoid factor (HCC)  Obesity (BMI 30-39.9) -     CMP14+EGFR -     TSH  Other orders -     phentermine (ADIPEX-P) 37.5 MG tablet; Take 1 tablet (37.5 mg total) by mouth daily before breakfast.       I am having Jean Hernandez start on phentermine. I am also having her maintain her etanercept, naproxen, leflunomide, loperamide, Calcium Citrate (CITRACAL PO), chlorpheniramine, montelukast, fluticasone, Teriparatide (Recombinant), pantoprazole, and famotidine.  Allergies as of 03/31/2018      Reactions   Amoxicillin Itching, Rash   REACTION: hives   Other Itching   Dilaudid   Oxycodone-acetaminophen Itching, Rash   Percocet [oxycodone-acetaminophen] Itching,  Rash   Ultracet [tramadol-acetaminophen] Itching, Rash, Hives   REACTION: itching   Vicodin [hydrocodone-acetaminophen] Itching, Hives   Propoxyphene N-acetaminophen    REACTION: itching      Medication List        Accurate as of 03/31/18  4:29 PM. Always use your most recent med list.          chlorpheniramine 4 MG tablet Commonly known as:  CHLOR-TRIMETON Take 8 mg by mouth at bedtime. And as needed during the day every 4 hours   CITRACAL PO Take 1 tablet by mouth 2 (two) times daily.   etanercept 25 MG injection Commonly known as:  ENBREL Inject 50 mg into the skin once a week.    famotidine 20 MG tablet Commonly known as:  PEPCID TAKE ONE TABLET AT BEDTIME   fluticasone 50 MCG/ACT nasal spray Commonly known as:  FLONASE Place 2 sprays into both nostrils daily.   FORTEO 600 MCG/2.4ML Soln Generic drug:  Teriparatide (Recombinant) Inject 20 mcg into the skin daily.   leflunomide 20 MG tablet Commonly known as:  ARAVA Take 20 mg by mouth daily.   loperamide 2 MG capsule Commonly known as:  IMODIUM Take 2 mg by mouth as needed for diarrhea or loose stools.   montelukast 10 MG tablet Commonly known as:  SINGULAIR Take 1 tablet (10 mg total) at bedtime by mouth.   naproxen 500 MG tablet Commonly known as:  NAPROSYN Take 500 mg by mouth as needed.   pantoprazole 40 MG tablet Commonly known as:  PROTONIX TAKE 1 TABLET ONCE DAILY 30 TO 60 MINUTES BEFORE FIRST MEAL OF THE DAY   phentermine 37.5 MG tablet Commonly known as:  ADIPEX-P Take 1 tablet (37.5 mg total) by mouth daily before breakfast.        Follow-up: Return in about 3 months (around 07/01/2018).  Claretta Fraise, M.D.

## 2018-04-01 LAB — CMP14+EGFR
A/G RATIO: 1.9 (ref 1.2–2.2)
ALBUMIN: 4.1 g/dL (ref 3.5–5.5)
ALK PHOS: 95 IU/L (ref 39–117)
ALT: 25 IU/L (ref 0–32)
AST: 24 IU/L (ref 0–40)
BUN / CREAT RATIO: 15 (ref 9–23)
BUN: 17 mg/dL (ref 6–24)
Bilirubin Total: 0.3 mg/dL (ref 0.0–1.2)
CO2: 25 mmol/L (ref 20–29)
CREATININE: 1.1 mg/dL — AB (ref 0.57–1.00)
Calcium: 9.1 mg/dL (ref 8.7–10.2)
Chloride: 104 mmol/L (ref 96–106)
GFR calc Af Amer: 69 mL/min/{1.73_m2} (ref >59)
GFR calc non Af Amer: 60 mL/min/{1.73_m2} (ref >59)
Globulin, Total: 2.2 g/dL (ref 1.5–4.5)
Glucose: 120 mg/dL — ABNORMAL HIGH (ref 65–99)
Potassium: 4.2 mmol/L (ref 3.5–5.2)
Sodium: 143 mmol/L (ref 134–144)
Total Protein: 6.3 g/dL (ref 6.0–8.5)

## 2018-04-01 LAB — TSH: TSH: 4.86 u[IU]/mL — AB (ref 0.450–4.500)

## 2018-04-02 ENCOUNTER — Other Ambulatory Visit: Payer: Self-pay | Admitting: Family Medicine

## 2018-04-02 ENCOUNTER — Other Ambulatory Visit: Payer: Self-pay | Admitting: *Deleted

## 2018-04-02 DIAGNOSIS — R7989 Other specified abnormal findings of blood chemistry: Secondary | ICD-10-CM

## 2018-04-02 LAB — T4, FREE: Free T4: 0.7 ng/dL — ABNORMAL LOW (ref 0.82–1.77)

## 2018-04-02 LAB — SPECIMEN STATUS REPORT

## 2018-04-02 LAB — THYROID PEROXIDASE ANTIBODY: Thyroperoxidase Ab SerPl-aCnc: 8 IU/mL (ref 0–34)

## 2018-04-02 MED ORDER — LEVOTHYROXINE SODIUM 50 MCG PO TABS: 50.0000 ug | ORAL_TABLET | Freq: Every day | ORAL | 0 refills | Status: DC

## 2018-04-03 ENCOUNTER — Telehealth: Payer: Self-pay | Admitting: Family Medicine

## 2018-04-04 NOTE — Telephone Encounter (Signed)
Aware of results. 

## 2018-04-22 DIAGNOSIS — M9903 Segmental and somatic dysfunction of lumbar region: Secondary | ICD-10-CM | POA: Diagnosis not present

## 2018-04-22 DIAGNOSIS — M9902 Segmental and somatic dysfunction of thoracic region: Secondary | ICD-10-CM | POA: Diagnosis not present

## 2018-04-22 DIAGNOSIS — M5432 Sciatica, left side: Secondary | ICD-10-CM | POA: Diagnosis not present

## 2018-04-30 DIAGNOSIS — M9902 Segmental and somatic dysfunction of thoracic region: Secondary | ICD-10-CM | POA: Diagnosis not present

## 2018-04-30 DIAGNOSIS — M9903 Segmental and somatic dysfunction of lumbar region: Secondary | ICD-10-CM | POA: Diagnosis not present

## 2018-04-30 DIAGNOSIS — M5432 Sciatica, left side: Secondary | ICD-10-CM | POA: Diagnosis not present

## 2018-04-30 DIAGNOSIS — M1712 Unilateral primary osteoarthritis, left knee: Secondary | ICD-10-CM | POA: Diagnosis not present

## 2018-04-30 DIAGNOSIS — M79602 Pain in left arm: Secondary | ICD-10-CM | POA: Diagnosis not present

## 2018-05-07 DIAGNOSIS — M9903 Segmental and somatic dysfunction of lumbar region: Secondary | ICD-10-CM | POA: Diagnosis not present

## 2018-05-07 DIAGNOSIS — M5432 Sciatica, left side: Secondary | ICD-10-CM | POA: Diagnosis not present

## 2018-05-07 DIAGNOSIS — M9902 Segmental and somatic dysfunction of thoracic region: Secondary | ICD-10-CM | POA: Diagnosis not present

## 2018-05-09 DIAGNOSIS — M0579 Rheumatoid arthritis with rheumatoid factor of multiple sites without organ or systems involvement: Secondary | ICD-10-CM | POA: Diagnosis not present

## 2018-05-22 ENCOUNTER — Ambulatory Visit: Payer: Federal, State, Local not specified - PPO | Admitting: Family Medicine

## 2018-05-22 ENCOUNTER — Encounter: Payer: Self-pay | Admitting: Family Medicine

## 2018-05-22 VITALS — BP 108/70 | HR 96 | Temp 98.0°F | Ht 67.0 in | Wt 207.2 lb

## 2018-05-22 DIAGNOSIS — B079 Viral wart, unspecified: Secondary | ICD-10-CM

## 2018-05-22 NOTE — Progress Notes (Signed)
Chief Complaint  Patient presents with  . Skin Problem    pt here today c/o "wart" on right thumb    HPI  Patient presents today for desire to have wart at right thumb removed.   PMH: Smoking status noted ROS: Per HPI  Objective: BP 108/70   Pulse 96   Temp 98 F (36.7 C) (Oral)   Ht 5\' 7"  (1.702 m)   Wt 207 lb 4 oz (94 kg)   BMI 32.46 kg/m  Gen: NAD, alert, cooperative with exam HEENT: NCAT, EOMI, PERRL Ext: No edema, warm. 4 mm verruca noted on right thumb. Neuro: Alert and oriented, No gross deficits  Assessment and plan:  1. Wart on thumb     Frozen with liquid nitrogen cryotherapy.   Wound care reviewed.  Follow up as needed.  , MD

## 2018-05-23 DIAGNOSIS — M0579 Rheumatoid arthritis with rheumatoid factor of multiple sites without organ or systems involvement: Secondary | ICD-10-CM | POA: Diagnosis not present

## 2018-06-06 DIAGNOSIS — M0579 Rheumatoid arthritis with rheumatoid factor of multiple sites without organ or systems involvement: Secondary | ICD-10-CM | POA: Diagnosis not present

## 2018-06-27 DIAGNOSIS — M170 Bilateral primary osteoarthritis of knee: Secondary | ICD-10-CM | POA: Diagnosis not present

## 2018-06-27 DIAGNOSIS — M2550 Pain in unspecified joint: Secondary | ICD-10-CM | POA: Diagnosis not present

## 2018-06-27 DIAGNOSIS — Z79899 Other long term (current) drug therapy: Secondary | ICD-10-CM | POA: Diagnosis not present

## 2018-06-27 DIAGNOSIS — M0579 Rheumatoid arthritis with rheumatoid factor of multiple sites without organ or systems involvement: Secondary | ICD-10-CM | POA: Diagnosis not present

## 2018-07-01 ENCOUNTER — Encounter: Payer: Self-pay | Admitting: Family Medicine

## 2018-07-01 ENCOUNTER — Ambulatory Visit (INDEPENDENT_AMBULATORY_CARE_PROVIDER_SITE_OTHER): Payer: Federal, State, Local not specified - PPO | Admitting: Family Medicine

## 2018-07-01 VITALS — BP 122/78 | HR 91 | Ht 67.0 in | Wt 196.4 lb

## 2018-07-01 DIAGNOSIS — E039 Hypothyroidism, unspecified: Secondary | ICD-10-CM | POA: Diagnosis not present

## 2018-07-01 DIAGNOSIS — R7989 Other specified abnormal findings of blood chemistry: Secondary | ICD-10-CM | POA: Diagnosis not present

## 2018-07-01 MED ORDER — PHENTERMINE HCL 15 MG PO CAPS: 15.0000 mg | ORAL_CAPSULE | ORAL | 2 refills | Status: DC

## 2018-07-01 NOTE — Progress Notes (Signed)
Subjective:  Patient ID: Jean Hernandez, female    DOB: 1970-12-06  Age: 47 y.o. MRN: [id number]  CC: Medical Management of Chronic Issues   HPI Jean Hernandez presents for patient presents for follow-up on  thyroid. The patient has recently been diagnosed with hypothyroidism Pt. denies any change in  voice, loss of hair, heat or cold intolerance. Energy level has been adequate to good. Patient denies constipation and diarrhea. No myxedema. Medication is as noted below. Verified that pt is taking it daily on an empty stomach. Well tolerated. Also using phentermine for weight loss with net of 32.8 lb since starting the med. Ready to cut back. Thinks she has the will power to do it on her own.  Depression screen [facility] 2/9 07/01/2018 07/01/2018 05/22/2018  Decreased Interest 0 0 0  Down, Depressed, Hopeless 0 0 0  PHQ - 2 Score 0 0 0    History Jean Hernandez has a past medical history of Rheumatoid arthritis ([place]).   She has a past surgical history that includes C sections; colonsocopy (2012); and Abdominal hysterectomy.   Her family history includes Allergies in her father, mother, and sister; Dementia in her father; Diabetes in her father; Early death in her father; Heart disease in her father; Hypertension in her mother.She reports that she has never smoked. She has never used smokeless tobacco. She reports that she does not drink alcohol or use drugs.    ROS Review of Systems  Constitutional: Negative.   HENT: Negative for congestion.   Eyes: Negative for visual disturbance.  Respiratory: Negative for shortness of breath.   Cardiovascular: Negative for chest pain.  Gastrointestinal: Negative for abdominal pain, constipation, diarrhea, nausea and vomiting.  Genitourinary: Negative for difficulty urinating.  Musculoskeletal: Negative for arthralgias and myalgias.  Neurological: Negative for headaches.  Psychiatric/Behavioral: Negative for sleep disturbance.    Objective:  BP 122/78   Pulse 91    Ht _0  (1.702 m)   Wt 196 lb 6 oz (89.1 kg)   BMI 30.76 kg/m   BP Readings from Last 3 Encounters:  07/01/18 122/78  05/22/18 108/70  03/31/18 128/88    Wt Readings from Last 3 Encounters:  07/01/18 196 lb 6 oz (89.1 kg)  05/22/18 207 lb 4 oz (94 kg)  03/31/18 228 lb 4 oz (103.5 kg)     Physical Exam  Constitutional: She is oriented to person, place, and time. She appears well-developed and well-nourished. No distress.  HENT:  Head: Normocephalic and atraumatic.  Eyes: Pupils are equal, round, and reactive to light. Conjunctivae are normal.  Neck: Normal range of motion. Neck supple. No thyromegaly present.  Cardiovascular: Normal rate, regular rhythm and normal heart sounds.  No murmur heard. Pulmonary/Chest: Effort normal and breath sounds normal. No respiratory distress. She has no wheezes. She has no rales.  Abdominal: Soft. Bowel sounds are normal. She exhibits no distension. There is no tenderness.  Musculoskeletal: Normal range of motion.  Lymphadenopathy:    She has no cervical adenopathy.  Neurological: She is alert and oriented to person, place, and time.  Skin: Skin is warm and dry.  Psychiatric: She has a normal mood and affect. Her behavior is normal. Judgment and thought content normal.      Assessment & Plan:   Jean Hernandez was seen today for medical management of chronic issues.  Diagnoses and all orders for this visit:  Acquired hypothyroidism -     CMP14+EGFR -     TSH -  T4, Free  Other orders -     phentermine 15 MG capsule; Take 1 capsule (15 mg total) by mouth every morning.       I have discontinued [facility] [place]. [place]'s phentermine. I am also having her start on phentermine. Additionally, I am having her maintain her naproxen, leflunomide, Calcium Citrate (CITRACAL PO), chlorpheniramine, montelukast, Teriparatide (Recombinant), pantoprazole, levothyroxine, and Certolizumab Pegol (CIMZIA [place]).  Allergies as of 07/01/2018      Reactions    Amoxicillin Itching, Rash   REACTION: hives   Other Itching   Dilaudid   Oxycodone-acetaminophen Itching, Rash   Percocet [oxycodone-acetaminophen] Itching, Rash   Ultracet [tramadol-acetaminophen] Itching, Rash, Hives   REACTION: itching   Vicodin [hydrocodone-acetaminophen] Itching, Hives   Propoxyphene N-acetaminophen    REACTION: itching      Medication List        Accurate as of 07/01/18  5:45 PM. Always use your most recent med list.          chlorpheniramine 4 MG tablet Commonly known as:  CHLOR-TRIMETON Take 8 mg by mouth at bedtime. And as needed during the day every 4 hours   CIMZIA [place] Inject into the skin.   CITRACAL PO Take 1 tablet by mouth 2 (two) times daily.   FORTEO 600 MCG/2.4ML Soln Generic drug:  Teriparatide (Recombinant) Inject 20 mcg into the skin daily.   leflunomide 20 MG tablet Commonly known as:  ARAVA Take 20 mg by mouth daily.   levothyroxine 50 MCG tablet Commonly known as:  SYNTHROID, LEVOTHROID Take 1 tablet (50 mcg total) by mouth daily.   montelukast 10 MG tablet Commonly known as:  SINGULAIR Take 1 tablet (10 mg total) at bedtime by mouth.   naproxen 500 MG tablet Commonly known as:  NAPROSYN Take 500 mg by mouth as needed.   pantoprazole 40 MG tablet Commonly known as:  PROTONIX TAKE 1 TABLET ONCE DAILY 30 TO 60 MINUTES BEFORE FIRST MEAL OF THE DAY   phentermine 15 MG capsule Take 1 capsule (15 mg total) by mouth every morning.        Follow-up: Return in about 3 months (around 09/30/2018).  Claretta Fraise, M.D.

## 2018-07-02 LAB — CMP14+EGFR
A/G RATIO: 2 (ref 1.2–2.2)
ALBUMIN: 4.4 g/dL (ref 3.5–5.5)
ALT: 38 IU/L — AB (ref 0–32)
AST: 30 IU/L (ref 0–40)
Alkaline Phosphatase: 105 IU/L (ref 39–117)
BILIRUBIN TOTAL: 0.3 mg/dL (ref 0.0–1.2)
BUN / CREAT RATIO: 23 (ref 9–23)
BUN: 18 mg/dL (ref 6–24)
CHLORIDE: 101 mmol/L (ref 96–106)
CO2: 21 mmol/L (ref 20–29)
Calcium: 10.1 mg/dL (ref 8.7–10.2)
Creatinine, Ser: 0.79 mg/dL (ref 0.57–1.00)
GFR calc non Af Amer: 88 mL/min/{1.73_m2} (ref >59)
GFR, EST AFRICAN AMERICAN: 102 mL/min/{1.73_m2} (ref >59)
GLOBULIN, TOTAL: 2.2 g/dL (ref 1.5–4.5)
Glucose: 88 mg/dL (ref 65–99)
POTASSIUM: 4.3 mmol/L (ref 3.5–5.2)
SODIUM: 140 mmol/L (ref 134–144)
TOTAL PROTEIN: 6.6 g/dL (ref 6.0–8.5)

## 2018-07-02 LAB — T4, FREE: Free T4: 1.24 ng/dL (ref 0.82–1.77)

## 2018-07-02 LAB — TSH: TSH: 1.68 u[IU]/mL (ref 0.450–4.500)

## 2018-07-07 ENCOUNTER — Telehealth: Payer: Self-pay | Admitting: Family Medicine

## 2018-07-07 ENCOUNTER — Encounter: Payer: Self-pay | Admitting: Family Medicine

## 2018-07-07 ENCOUNTER — Other Ambulatory Visit: Payer: Self-pay | Admitting: Family Medicine

## 2018-07-07 DIAGNOSIS — M0579 Rheumatoid arthritis with rheumatoid factor of multiple sites without organ or systems involvement: Secondary | ICD-10-CM | POA: Diagnosis not present

## 2018-07-07 MED ORDER — LEVOTHYROXINE SODIUM 50 MCG PO TABS: 50.0000 ug | ORAL_TABLET | Freq: Every day | ORAL | 1 refills | Status: DC

## 2018-07-07 NOTE — Telephone Encounter (Signed)
I spoke to pt. Not only NOT upset, but in fact appreciative. She just never heard from her labs and was worried about what dose of thyroid med to take. I asked her to continue to take the current dose since the range of the lab was god. WS

## 2018-08-06 DIAGNOSIS — M0579 Rheumatoid arthritis with rheumatoid factor of multiple sites without organ or systems involvement: Secondary | ICD-10-CM | POA: Diagnosis not present

## 2018-09-03 DIAGNOSIS — M0579 Rheumatoid arthritis with rheumatoid factor of multiple sites without organ or systems involvement: Secondary | ICD-10-CM | POA: Diagnosis not present

## 2018-09-24 ENCOUNTER — Encounter: Payer: Self-pay | Admitting: Family Medicine

## 2018-09-24 ENCOUNTER — Telehealth: Payer: Self-pay | Admitting: Family Medicine

## 2018-09-24 ENCOUNTER — Ambulatory Visit: Payer: Federal, State, Local not specified - PPO | Admitting: Family Medicine

## 2018-09-24 VITALS — BP 130/92 | HR 91 | Temp 97.0°F | Ht 67.0 in | Wt 189.2 lb

## 2018-09-24 DIAGNOSIS — J101 Influenza due to other identified influenza virus with other respiratory manifestations: Secondary | ICD-10-CM

## 2018-09-24 LAB — VERITOR FLU A/B WAIVED
INFLUENZA B: NEGATIVE
Influenza A: POSITIVE — AB

## 2018-09-24 LAB — CULTURE, GROUP A STREP

## 2018-09-24 LAB — RAPID STREP SCREEN (MED CTR MEBANE ONLY): Strep Gp A Ag, IA W/Reflex: NEGATIVE

## 2018-09-24 MED ORDER — OSELTAMIVIR PHOSPHATE 75 MG PO CAPS: 75.0000 mg | ORAL_CAPSULE | Freq: Two times a day (BID) | ORAL | 0 refills | Status: DC

## 2018-09-24 MED ORDER — ONDANSETRON 4 MG PO TBDP: 4.0000 mg | ORAL_TABLET | Freq: Three times a day (TID) | ORAL | 0 refills | Status: DC | PRN

## 2018-09-24 NOTE — Progress Notes (Signed)
BP (!) 130/92   Pulse 91   Temp (!) 97 F (36.1 C) (Oral)   Ht 5\' 7"  (1.702 m)   Wt 189 lb 3.2 oz (85.8 kg)   BMI 29.63 kg/m    Subjective:    Patient ID: , female    DOB: 1970/11/19, 47 y.o.   MRN: [id number]  HPI: Jean Hernandez is a 47 y.o. female presenting on 09/24/2018 for Generalized Body Aches; Headache; and Sore Throat   HPI Sore throat body aches and headache Patient comes in with complaints of sore throat body ache and headache this been going on for the past 2 days.  She has been having the body aches progressively be getting worse her cough is been getting worse and she is been having a lot of sinus pressure and sore throat and drainage.  She has used some Mucinex and TheraFlu which have not seemed to help.  She is had feverishness and chills but has not taken her temperature.  Relevant past medical, surgical, family and social history reviewed and updated as indicated. Interim medical history since our last visit reviewed. Allergies and medications reviewed and updated.  Review of Systems  Constitutional: Positive for chills and fever.  HENT: Positive for congestion, postnasal drip, rhinorrhea, sinus pressure and sore throat. Negative for ear discharge, ear pain and sneezing.   Eyes: Negative for pain, redness and visual disturbance.  Respiratory: Positive for cough. Negative for chest tightness and shortness of breath.   Cardiovascular: Negative for chest pain and leg swelling.  Genitourinary: Negative for difficulty urinating and dysuria.  Musculoskeletal: Positive for myalgias. Negative for back pain and gait problem.  Skin: Negative for rash.  Neurological: Negative for light-headedness and headaches.  Psychiatric/Behavioral: Negative for agitation and behavioral problems.  All other systems reviewed and are negative.   Per HPI unless specifically indicated above   Allergies as of 09/24/2018      Reactions   Amoxicillin Itching, Rash   REACTION: hives   Other Itching   Dilaudid   Oxycodone-acetaminophen Itching, Rash   Percocet [oxycodone-acetaminophen] Itching, Rash   Ultracet [tramadol-acetaminophen] Itching, Rash, Hives   REACTION: itching   Vicodin [hydrocodone-acetaminophen] Itching, Hives   Propoxyphene N-acetaminophen    REACTION: itching      Medication List        Accurate as of 09/24/18 12:57 PM. Always use your most recent med list.          chlorpheniramine 4 MG tablet Commonly known as:  CHLOR-TRIMETON Take 8 mg by mouth at bedtime. And as needed during the day every 4 hours   CIMZIA [place] Inject into the skin.   CITRACAL PO Take 1 tablet by mouth 2 (two) times daily.   FORTEO 600 MCG/2.4ML Soln Generic drug:  Teriparatide (Recombinant) Inject 20 mcg into the skin daily.   leflunomide 20 MG tablet Commonly known as:  ARAVA Take 20 mg by mouth daily.   levothyroxine 50 MCG tablet Commonly known as:  SYNTHROID, LEVOTHROID Take 1 tablet (50 mcg total) by mouth daily.   montelukast 10 MG tablet Commonly known as:  SINGULAIR Take 1 tablet (10 mg total) at bedtime by mouth.   naproxen 500 MG tablet Commonly known as:  NAPROSYN Take 500 mg by mouth as needed.   oseltamivir 75 MG capsule Commonly known as:  TAMIFLU Take 1 capsule (75 mg total) by mouth 2 (two) times daily.          Objective:  BP (!) 130/92   Pulse 91   Temp (!) 97 F (36.1 C) (Oral)   Ht 5\' 7"  (1.702 m)   Wt 189 lb 3.2 oz (85.8 kg)   BMI 29.63 kg/m   Wt Readings from Last 3 Encounters:  09/24/18 189 lb 3.2 oz (85.8 kg)  07/01/18 196 lb 6 oz (89.1 kg)  05/22/18 207 lb 4 oz (94 kg)    Physical Exam  Constitutional: She is oriented to person, place, and time. She appears well-developed and well-nourished. No distress.  HENT:  Right Ear: Tympanic membrane, external ear and ear canal normal.  Left Ear: Tympanic membrane, external ear and ear canal normal.  Nose: Mucosal edema and rhinorrhea present. No  epistaxis. Right sinus exhibits no maxillary sinus tenderness and no frontal sinus tenderness. Left sinus exhibits no maxillary sinus tenderness and no frontal sinus tenderness.  Mouth/Throat: Uvula is midline and mucous membranes are normal. Posterior oropharyngeal edema and posterior oropharyngeal erythema present. No oropharyngeal exudate or tonsillar abscesses.  Eyes: Conjunctivae and EOM are normal.  Cardiovascular: Normal rate, regular rhythm, normal heart sounds and intact distal pulses.  No murmur heard. Pulmonary/Chest: Effort normal and breath sounds normal. No respiratory distress. She has no wheezes.  Musculoskeletal: Normal range of motion. She exhibits no edema or tenderness.  Neurological: She is alert and oriented to person, place, and time. Coordination normal.  Skin: Skin is warm and dry. No rash noted. She is not diaphoretic.  Psychiatric: She has a normal mood and affect. Her behavior is normal.  Vitals reviewed.   Rapid flu a positive    Assessment & Plan:   Problem List Items Addressed This Visit    None    Visit Diagnoses    Influenza A    -  Primary   Relevant Medications   oseltamivir (TAMIFLU) 75 MG capsule   Other Relevant Orders   Rapid Strep Screen ([facility] ONLY)   Veritor Flu A/B Waived      Sent Tamiflu nasal sprays and Mucinex and humidifier if needed Follow up plan: Return if symptoms worsen or fail to improve.  Counseling provided for all of the vaccine components Orders Placed This Encounter  Procedures  . Rapid Strep Screen ([facility] ONLY)  . Veritor Flu A/B Waived    07/22/18, MD [facility] 09/24/2018, 12:57 PM

## 2018-09-24 NOTE — Telephone Encounter (Signed)
PT has called back stating that dr dettinger was suppose to send in medicine for nausea and it was never sent to [facility], pt is very upset and would like this to be sent so it can be delivered tomorrow.

## 2018-09-24 NOTE — Telephone Encounter (Signed)
I sent in zofran for her

## 2018-09-25 NOTE — Telephone Encounter (Signed)
Pt aware rx sent in. 

## 2018-09-26 ENCOUNTER — Telehealth: Payer: Self-pay | Admitting: Family Medicine

## 2018-09-26 ENCOUNTER — Other Ambulatory Visit: Payer: Self-pay | Admitting: Family Medicine

## 2018-09-26 MED ORDER — FIRST-DUKES MOUTHWASH MT SUSP: 5.0000 mL | OROMUCOSAL | 1 refills | Status: DC | PRN

## 2018-09-26 NOTE — Telephone Encounter (Signed)
Pt states she has tried honey, tylenol, ibuprofen, lozenges and swishing salt water and nothing helps. Is there anything we can call in?

## 2018-09-26 NOTE — Telephone Encounter (Signed)
Pharmacy: [facility]     PT states that her throat is hurting her really bad and is wanting something sent into the pharmacy to help with it, states she was diagnosed with the flu earlier this week.

## 2018-09-26 NOTE — Telephone Encounter (Signed)
Left detailed message stating rx sent over and to call back with any further questions or concerns.

## 2018-09-26 NOTE — Telephone Encounter (Signed)
I sent in the requested prescription 

## 2018-09-29 ENCOUNTER — Other Ambulatory Visit: Payer: Self-pay | Admitting: Internal Medicine

## 2018-09-29 DIAGNOSIS — R058 Other specified cough: Secondary | ICD-10-CM

## 2018-09-29 DIAGNOSIS — R05 Cough: Secondary | ICD-10-CM

## 2018-09-30 ENCOUNTER — Ambulatory Visit: Payer: Federal, State, Local not specified - PPO | Admitting: Family Medicine

## 2018-10-21 ENCOUNTER — Encounter: Payer: Self-pay | Admitting: Family Medicine

## 2018-10-21 ENCOUNTER — Encounter: Payer: Self-pay | Admitting: *Deleted

## 2018-10-21 ENCOUNTER — Ambulatory Visit: Payer: Federal, State, Local not specified - PPO | Admitting: Family Medicine

## 2018-10-21 VITALS — BP 136/83 | HR 84 | Temp 97.6°F | Ht 67.0 in | Wt 198.0 lb

## 2018-10-21 DIAGNOSIS — J020 Streptococcal pharyngitis: Secondary | ICD-10-CM | POA: Diagnosis not present

## 2018-10-21 DIAGNOSIS — J029 Acute pharyngitis, unspecified: Secondary | ICD-10-CM | POA: Diagnosis not present

## 2018-10-21 LAB — RAPID STREP SCREEN (MED CTR MEBANE ONLY): STREP GP A AG, IA W/REFLEX: POSITIVE — AB

## 2018-10-21 MED ORDER — AZITHROMYCIN 250 MG PO TABS: ORAL_TABLET | ORAL | 0 refills | Status: DC

## 2018-10-21 MED ORDER — MAGIC MOUTHWASH W/LIDOCAINE: 5.0000 mL | Freq: Four times a day (QID) | ORAL | 1 refills | Status: DC | PRN

## 2018-10-21 NOTE — Progress Notes (Signed)
Chief Complaint  Patient presents with  . Sore Throat    HPI  Patient presents today for sore throat for 1 month. Much worse last night. Denies fever. Had flu at onset one month ago.  PMH: Smoking status noted ROS: Per HPI  Objective: BP 136/83   Pulse 84   Temp 97.6 F (36.4 C) (Oral)   Ht 5\' 7"  (1.702 m)   Wt 198 lb (89.8 kg)   BMI 31.01 kg/m  Gen: NAD, alert, cooperative with exam HEENT: NCAT, EOMI, PERRL. Pharynx has mild erythema CV: RRR, good S1/S2, no murmur Resp: CTABL, no wheezes, non-labored Ext: No edema, warm Neuro: Alert and oriented, No gross deficits Strep screen positive Assessment and plan:  1. Strep pharyngitis   2. Sore throat     Meds ordered this encounter  Medications  . azithromycin (ZITHROMAX Z-PAK) 250 MG tablet    Sig: Take two right away Then one a day for the next 4 days.    Dispense:  6 each    Refill:  0  . magic mouthwash w/lidocaine SOLN    Sig: Take 5 mLs by mouth 4 (four) times daily as needed for mouth pain. Gargle. Do not swalllow    Dispense:  120 mL    Refill:  1    Orders Placed This Encounter  Procedures  . Rapid Strep Screen ([facility] ONLY)  . Culture, Group A Strep    Order Specific Question:   Source    Answer:   THROAT    Follow up as needed.  Mechele Claude, MD

## 2018-10-29 DIAGNOSIS — Z79899 Other long term (current) drug therapy: Secondary | ICD-10-CM | POA: Diagnosis not present

## 2018-10-29 DIAGNOSIS — M0579 Rheumatoid arthritis with rheumatoid factor of multiple sites without organ or systems involvement: Secondary | ICD-10-CM | POA: Diagnosis not present

## 2018-11-05 DIAGNOSIS — Z79899 Other long term (current) drug therapy: Secondary | ICD-10-CM | POA: Diagnosis not present

## 2018-11-05 DIAGNOSIS — M170 Bilateral primary osteoarthritis of knee: Secondary | ICD-10-CM | POA: Diagnosis not present

## 2018-11-05 DIAGNOSIS — M2550 Pain in unspecified joint: Secondary | ICD-10-CM | POA: Diagnosis not present

## 2018-11-05 DIAGNOSIS — M0579 Rheumatoid arthritis with rheumatoid factor of multiple sites without organ or systems involvement: Secondary | ICD-10-CM | POA: Diagnosis not present

## 2018-11-07 ENCOUNTER — Ambulatory Visit: Payer: Federal, State, Local not specified - PPO

## 2018-11-26 DIAGNOSIS — M0579 Rheumatoid arthritis with rheumatoid factor of multiple sites without organ or systems involvement: Secondary | ICD-10-CM | POA: Diagnosis not present

## 2018-12-18 DIAGNOSIS — H10013 Acute follicular conjunctivitis, bilateral: Secondary | ICD-10-CM | POA: Diagnosis not present

## 2018-12-18 DIAGNOSIS — M9903 Segmental and somatic dysfunction of lumbar region: Secondary | ICD-10-CM | POA: Diagnosis not present

## 2018-12-18 DIAGNOSIS — M9902 Segmental and somatic dysfunction of thoracic region: Secondary | ICD-10-CM | POA: Diagnosis not present

## 2018-12-18 DIAGNOSIS — M5432 Sciatica, left side: Secondary | ICD-10-CM | POA: Diagnosis not present

## 2018-12-18 DIAGNOSIS — H40033 Anatomical narrow angle, bilateral: Secondary | ICD-10-CM | POA: Diagnosis not present

## 2018-12-22 DIAGNOSIS — M9903 Segmental and somatic dysfunction of lumbar region: Secondary | ICD-10-CM | POA: Diagnosis not present

## 2018-12-22 DIAGNOSIS — M9902 Segmental and somatic dysfunction of thoracic region: Secondary | ICD-10-CM | POA: Diagnosis not present

## 2018-12-22 DIAGNOSIS — M5432 Sciatica, left side: Secondary | ICD-10-CM | POA: Diagnosis not present

## 2018-12-24 DIAGNOSIS — M0579 Rheumatoid arthritis with rheumatoid factor of multiple sites without organ or systems involvement: Secondary | ICD-10-CM | POA: Diagnosis not present

## 2018-12-31 DIAGNOSIS — M9902 Segmental and somatic dysfunction of thoracic region: Secondary | ICD-10-CM | POA: Diagnosis not present

## 2018-12-31 DIAGNOSIS — M9903 Segmental and somatic dysfunction of lumbar region: Secondary | ICD-10-CM | POA: Diagnosis not present

## 2018-12-31 DIAGNOSIS — M5432 Sciatica, left side: Secondary | ICD-10-CM | POA: Diagnosis not present

## 2019-01-08 DIAGNOSIS — M9902 Segmental and somatic dysfunction of thoracic region: Secondary | ICD-10-CM | POA: Diagnosis not present

## 2019-01-08 DIAGNOSIS — M9903 Segmental and somatic dysfunction of lumbar region: Secondary | ICD-10-CM | POA: Diagnosis not present

## 2019-01-08 DIAGNOSIS — M5432 Sciatica, left side: Secondary | ICD-10-CM | POA: Diagnosis not present

## 2019-01-12 ENCOUNTER — Other Ambulatory Visit: Payer: Self-pay

## 2019-01-12 ENCOUNTER — Ambulatory Visit (INDEPENDENT_AMBULATORY_CARE_PROVIDER_SITE_OTHER): Payer: Federal, State, Local not specified - PPO | Admitting: Family Medicine

## 2019-01-12 ENCOUNTER — Encounter: Payer: Self-pay | Admitting: Family Medicine

## 2019-01-12 DIAGNOSIS — J0110 Acute frontal sinusitis, unspecified: Secondary | ICD-10-CM | POA: Diagnosis not present

## 2019-01-12 MED ORDER — FLUTICASONE PROPIONATE 50 MCG/ACT NA SUSP: 2.0000 | Freq: Every day | NASAL | 6 refills | Status: DC

## 2019-01-12 MED ORDER — DOXYCYCLINE HYCLATE 100 MG PO TABS: 100.0000 mg | ORAL_TABLET | Freq: Two times a day (BID) | ORAL | 0 refills | Status: AC

## 2019-01-12 NOTE — Progress Notes (Signed)
Virtual Visit via telephone Note  I connected with Jean Hernandez on 01/12/19 at 0910 by telephone and verified that I am speaking with the correct person using two identifiers. Jean Hernandez is currently located at home and family is currently with them during visit. The provider, Kari Baars, FNP is located in their office at time of visit.  I discussed the limitations, risks, security and privacy concerns of performing an evaluation and management service by telephone and the availability of in person appointments. I also discussed with the patient that there may be a patient responsible charge related to this service. The patient expressed understanding and agreed to proceed.  Subjective:  Patient ID: Jean Hernandez, female    DOB: [birth year], 48 y.o.   MRN: [id number]  Chief Complaint:  Sinus problem  HPI: Jean Hernandez is a 48 y.o. female presenting on 01/12/2019 for sinus problem  Pt reports sinus pressure / pain, low grade fever, runny nose, sore throat, nasal congestion, and sneezing. Pt states this started 6 days ago and is getting worse. She states she has pressure, 4/10, above her eyes. States she is coughing up thick yellowish-green sputum, and has a sore throat. States the sinus pressure is worse with bending over. States she has pressure in her ears. She has tried over the counter saline nasal sprays and Tylenol sinus without relief of symptoms. No recent travel or known sick exposures.    Relevant past medical, surgical, family, and social history reviewed and updated as indicated.  Allergies and medications reviewed and updated.   Past Medical History:  Diagnosis Date  . Rheumatoid arthritis [facility])     Past Surgical History:  Procedure Laterality Date  . ABDOMINAL HYSTERECTOMY    . C sections     x2  . colonsocopy  2012    Social History   Socioeconomic History  . Marital status: Married    Spouse name: Not on file  . Number of children: Not on file  . Years of  education: Not on file  . Highest education level: Not on file  Occupational History  . Not on file  Social Needs  . Financial resource strain: Not on file  . Food insecurity:    Worry: Not on file    Inability: Not on file  . Transportation needs:    Medical: Not on file    Non-medical: Not on file  Tobacco Use  . Smoking status: Never Smoker  . Smokeless tobacco: Never Used  Substance and Sexual Activity  . Alcohol use: No    Alcohol/week: 0.0 standard drinks  . Drug use: No  . Sexual activity: Not on file  Lifestyle  . Physical activity:    Days per week: Not on file    Minutes per session: Not on file  . Stress: Not on file  Relationships  . Social connections:    Talks on phone: Not on file    Gets together: Not on file    Attends religious service: Not on file    Active member of club or organization: Not on file    Attends meetings of clubs or organizations: Not on file    Relationship status: Not on file  . Intimate partner violence:    Fear of current or ex partner: Not on file    Emotionally abused: Not on file    Physically abused: Not on file    Forced sexual activity: Not on file  Other Topics Concern  .  Not on file  Social History Narrative  . Not on file    Outpatient Encounter Medications as of 01/12/2019  Medication Sig  . azithromycin (ZITHROMAX Z-PAK) 250 MG tablet Take two right away Then one a day for the next 4 days.  . Calcium Citrate (CITRACAL PO) Take 1 tablet by mouth 2 (two) times daily.  . Certolizumab Pegol (CIMZIA [place]) Inject into the skin.  . chlorpheniramine (CHLOR-TRIMETON) 4 MG tablet Take 8 mg by mouth at bedtime. And as needed during the day every 4 hours  . doxycycline (VIBRA-TABS) 100 MG tablet Take 1 tablet (100 mg total) by mouth 2 (two) times daily for 7 days. 1 po bid  . fluticasone (FLONASE) 50 MCG/ACT nasal spray Place 2 sprays into both nostrils daily.  Marland Kitchen leflunomide (ARAVA) 20 MG tablet Take 20 mg by mouth daily.  Marland Kitchen  levothyroxine (SYNTHROID, LEVOTHROID) 50 MCG tablet Take 1 tablet (50 mcg total) by mouth daily.  . magic mouthwash w/lidocaine SOLN Take 5 mLs by mouth 4 (four) times daily as needed for mouth pain. Gargle. Do not swalllow  . montelukast (SINGULAIR) 10 MG tablet TAKE ONE TABLET AT BEDTIME  . naproxen (NAPROSYN) 500 MG tablet Take 500 mg by mouth as needed.  . Teriparatide, Recombinant, (FORTEO) 600 MCG/2.4ML SOLN Inject 20 mcg into the skin daily.   No facility-administered encounter medications on file as of 01/12/2019.     Allergies  Allergen Reactions  . Amoxicillin Itching and Rash    REACTION: hives  . Other Itching    Dilaudid  . Oxycodone-Acetaminophen Itching and Rash  . Percocet [Oxycodone-Acetaminophen] Itching and Rash  . Ultracet [Tramadol-Acetaminophen] Itching, Rash and Hives    REACTION: itching  . Vicodin [Hydrocodone-Acetaminophen] Itching and Hives  . Propoxyphene N-Acetaminophen     REACTION: itching    Review of Systems  Constitutional: Positive for chills, fatigue and fever.  HENT: Positive for congestion, postnasal drip, rhinorrhea, sinus pressure, sinus pain, sneezing and sore throat. Negative for tinnitus, trouble swallowing and voice change.   Respiratory: Positive for cough. Negative for chest tightness and shortness of breath.   Cardiovascular: Negative for chest pain and palpitations.  Musculoskeletal: Negative for arthralgias and myalgias.  Neurological: Positive for headaches. Negative for dizziness, weakness and light-headedness.  Hematological: Negative for adenopathy.  Psychiatric/Behavioral: Negative for confusion.  All other systems reviewed and are negative.        Observations/Objective: No VS, this was a telephone encounter. Pt alert and oriented, answers all questions appropriately, and able to speak in full sentences.    Assessment and Plan: Diagnoses and all orders for this visit:  Acute non-recurrent frontal sinusitis  Symptomatic care discussed. Frequent saline nasal sprays. Tylenol as needed for fever and pain control. Medications as prescribed. Report any new or worsening symptoms.  -     doxycycline (VIBRA-TABS) 100 MG tablet; Take 1 tablet (100 mg total) by mouth 2 (two) times daily for 7 days. 1 po bid -     fluticasone (FLONASE) 50 MCG/ACT nasal spray; Place 2 sprays into both nostrils daily.     Follow Up Instructions: Follow up for any new or worsening symptoms.     I discussed the assessment and treatment plan with the patient. The patient was provided an opportunity to ask questions and all were answered. The patient agreed with the plan and demonstrated an understanding of the instructions.   The patient was advised to call back or seek an in-person evaluation if the symptoms worsen  or if the condition fails to improve as anticipated.  The above assessment and management plan was discussed with the patient. The patient verbalized understanding of and has agreed to the management plan. Patient is aware to call the clinic if symptoms persist or worsen. Patient is aware when to return to the clinic for a follow-up visit. Patient educated on when it is appropriate to go to the emergency department.    I provided 10 minutes of non-face-to-face time during this encounter. The call started at 0910. The call ended at 0920.   Kari BaarsMichelle Rakes, FNP-C [facility] [address] [place], [place] [id number] [phone]

## 2019-01-14 ENCOUNTER — Telehealth: Payer: Self-pay | Admitting: Family Medicine

## 2019-01-14 NOTE — Telephone Encounter (Signed)
Contact with PUI  Daily until 3/20. He just found out today that COVID test is positive. He is hospitalized. Five days so far. Pt had fever to 101 on 3/21. None since that time. Dry Cough  Since then persists. Has sinus infection. Producing purulent nasal discharge.  Pt. Not dyspneic. I advised her to shelter in place. If dyspnea occurs, especially with return of fever, she should go to E.D.

## 2019-01-27 DIAGNOSIS — M5432 Sciatica, left side: Secondary | ICD-10-CM | POA: Diagnosis not present

## 2019-01-27 DIAGNOSIS — M9903 Segmental and somatic dysfunction of lumbar region: Secondary | ICD-10-CM | POA: Diagnosis not present

## 2019-01-27 DIAGNOSIS — M0579 Rheumatoid arthritis with rheumatoid factor of multiple sites without organ or systems involvement: Secondary | ICD-10-CM | POA: Diagnosis not present

## 2019-01-27 DIAGNOSIS — M9902 Segmental and somatic dysfunction of thoracic region: Secondary | ICD-10-CM | POA: Diagnosis not present

## 2019-02-05 ENCOUNTER — Ambulatory Visit: Payer: Federal, State, Local not specified - PPO | Admitting: Internal Medicine

## 2019-02-09 ENCOUNTER — Encounter: Payer: Self-pay | Admitting: Internal Medicine

## 2019-02-09 ENCOUNTER — Ambulatory Visit (INDEPENDENT_AMBULATORY_CARE_PROVIDER_SITE_OTHER): Payer: Federal, State, Local not specified - PPO | Admitting: Internal Medicine

## 2019-02-09 ENCOUNTER — Other Ambulatory Visit: Payer: Self-pay

## 2019-02-09 DIAGNOSIS — R058 Other specified cough: Secondary | ICD-10-CM

## 2019-02-09 DIAGNOSIS — R05 Cough: Secondary | ICD-10-CM

## 2019-02-09 DIAGNOSIS — M069 Rheumatoid arthritis, unspecified: Secondary | ICD-10-CM | POA: Diagnosis not present

## 2019-02-09 MED ORDER — MONTELUKAST SODIUM 10 MG PO TABS: 10.0000 mg | ORAL_TABLET | Freq: Every day | ORAL | 3 refills | Status: DC

## 2019-02-09 MED ORDER — LEFLUNOMIDE 20 MG PO TABS: ORAL_TABLET | ORAL | Status: DC

## 2019-02-09 NOTE — Progress Notes (Signed)
Subjective:     Patient ID: Jean Hernandez, female   DOB: 01-24-1971,    MRN: [id number]     Brief patient profile:  40 yowf  Never smoker healthy then around 2008 while in [place] variable pattern of intermittnent   sneezing/ cough > allergy eval > shots x a couple months no better changed so just   flonase / zyrtec until 2010 joint stiffness abrupt dx with RA per Nickola Major then cough worse/daily since   around 2012  Then severe  to point of vomiting around  2014  With barium swallow done around 2016 and nl gastric emptying  and Bates eval 12/27/16 >   Omeprazole bid and no change so eval by GI  May 2018 rec manometry / egd and deferred by pt who self referred to pulmonary clinic 07/18/2017     History of Present Illness  07/18/2017 1st  Pulmonary office visit/ [name]   Chief Complaint  Patient presents with  . Pulmoary Consult    Self referral. Pt c/o cough and SOB for the past 2 yrs, worse for the past 3 wks. Her cough bothers her all day and wakes her up in the night. She coughs until she vomits ands has also been developing HA. She gets SOB with walking long distances and also when she talks.   if not coughing breathing is fine  Cough Previously not bothering her as much sleeping as daytime but now waking her up/ does better rotated on side  In am coughs up scant  white mucus but really not excess/ purulent sputum or mucus plugs   Anoro x one year no better  Prednisone may help / took pred up to 20 mg prn   Kouffman Reflux v Neurogenic Cough Differentiator Reflux Comments  Do you awaken from a sound sleep coughing violently?                            With trouble breathing? Sometimes yes   Do you have choking episodes when you cannot  Get enough air, gasping for air ?              Yes   Do you usually cough when you lie down into  The bed, or when you just lie down to rest ?                          no   Do you usually cough after meals or eating?         no   Do you cough when (or  after) you bend over?    no   GERD SCORE     Kouffman Reflux v Neurogenic Cough Differentiator Neurogenic   Do you more-or-less cough all day long? All day    Does change of temperature make you cough? Hot muggy   Does laughing or chuckling cause you to cough? no   Do fumes (perfume, automobile fumes, burned  Toast, etc.,) cause you to cough ?      smoke   Does speaking, singing, or talking on the phone cause you to cough   ?               Def speaking   Neurogenic/Airway score     Not limited by breathing from desired activities  Unless coughing rec  First take delsym two tsp every 12 hours and supplement if needed with  Demerol  50 mg up to 1every 4 hours to suppress the urge to cough at all or even clear your throat. Swallowing water or using ice chips/non mint and menthol containing candies (such as lifesavers or sugarless jolly ranchers) are also effective.  You should rest your voice and avoid activities that you know make you cough. Once you have eliminated the cough for 3 straight days try reducing the demerol first,  then the delsym as tolerated.   Prednisone 10 mg take  4 each am x 2 days,   2 each am x 2 days,  1 each am x 2 days and stop (this is to eliminate allergies and inflammation from coughing) For drainage / throat tickle try take CHLORPHENIRAMINE  4 mg - take one every 4 hours as needed - available over the counter- may cause drowsiness so start with just a bedtime dose or two and see how you tolerate it before trying in daytime   Protonix (pantoprazole) Take 30-60 min before first meal of the day and Pepcid 20 mg one bedtime plus chlorpheniramine 4 mg x 2 at bedtime (both available over the counter)  until cough is completely gone for at least a week without the need for cough suppression GERD  Diet       08/06/2017  f/u ov/[name] re: uacs  Chief Complaint  Patient presents with  . Follow-up    Cough has improved some but has not resolved. She is occ coughing up some  clear sputum.    ate sausage night before ov > cough to vomit / otherwise 95% improved an no longer needing demerol or deslym rec No change in recommendations including using over the counter delsym and chlortrimeton as needed  See your gi doctor at your convenience re reflux options > did not do Prednisone x 6 days prn flare  > did not feel she needed   09/03/2017  f/u ov/[name] re: cough x 2012  Chief Complaint  Patient presents with  . Follow-up    Pt states that her cough is some better, but has not resolved.   onset hacking p stirs w/in 5 min/ does not wake her -  Still rates herself as 95 % better  Nasal congestion also worse in am's but no correlation with pos RAST (trees/grass) in terms of severity spring vs winter Not limited by breathing from desired activities   Still occ hb though on ppi q am and h2 hs consistently rec Please see patient coordinator before you leave today  to schedule sinus CT  Singulair 10 mg at bedtime  For drainage / throat tickle try take CHLORPHENIRAMINE  4 mg - take one every 4 hours as needed - GERD diet   Keep appt to see Dr Loreta Ave to complete your GI work up  Please schedule a follow up office visit in 6 weeks, call sooner if needed  Add:  Consider trial of dymista for uacs/ pnds if not better from singulair trial       10/10/2017  f/u ov/[name] re: cough x 2012  Chief Complaint  Patient presents with  . Follow-up    coughing   best she's been in 6 years   Not limited by breathing from desired activities Only using h1 no more than bid and seems to help/ well tolerted at 4 mg bid rec For drainage / throat tickle try take CHLORPHENIRAMINE  4 mg - take up to one every 4 hours as needed -  02/04/2018  f/u ov/[name] re:  UACS/ allergic rhinitis  In setting of RA Chief Complaint  Patient presents with  . Follow-up    Pt states "cough is pretty good"- progressively better since the last visit.   Dyspnea:  MMRC1 = can walk nl pace, flat grade,  can't hurry or go uphills or steps s sob   Cough: gone as long as take 1st gen H1 blockers per guidelines   Sleep: insomnia/ chronic, ever p 2 h1s  Vomiting once  A week ? Related to eating always heralded by throat clearing first > [facility] plans w/u rec No change in medicines for now  But ok to try dysmista one twice daily in place of flonase and call if note any improvement  Continue 1st gen H1 blockers per guidelines   GI f/u for vomiting           Virtual Visit via Telephone Note 02/09/2019  Re uacs resolved on 1st gen H1 blockers per guidelines    I connected with Aura CampsSarah E Rease on 02/09/19 at  8:45 AM EDT by telephone and verified that I am speaking with the correct person using two identifiers.   I discussed the limitations, risks, security and privacy concerns of performing an evaluation and management service by telephone and the availability of in person appointments. I also discussed with the patient that there may be a patient responsible charge related to this service. The patient expressed understanding and agreed to proceed.   History of Present Illness: Takes chlortablet 4mg  x 4 each am still some throat Dyspnea:  Walking neighborhood / some hills Cough: min throat clearing  Sleeping: either side/ one pillow SABA use: none 02: none   No obvious day to day or daytime variability or assoc excess/ purulent sputum or mucus plugs or hemoptysis or cp or chest tightness, subjective wheeze or overt sinus or hb symptoms.    Also denies any obvious fluctuation of symptoms with weather or environmental changes or other aggravating or alleviating factors except as outlined above.   Meds reviewed/ med reconciliation completed         Observations/Objective: Sounds great on the phone, never cleared throat, good phonation   Assessment and Plan: See problem list for active a/p's   Follow Up Instructions: See avs for instructions unique to this ov which includes revised/  updated med list     I discussed the assessment and treatment plan with the patient. The patient was provided an opportunity to ask questions and all were answered. The patient agreed with the plan and demonstrated an understanding of the instructions.   The patient was advised to call back or seek an in-person evaluation if the symptoms worsen or if the condition fails to improve as anticipated.  I provided 15 minutes of non-face-to-face time during this encounter.   Sandrea HughsMichael Marcellius Montagna, MD

## 2019-02-09 NOTE — Assessment & Plan Note (Signed)
Onset 2008 with assoc rhinitis> ? failed allergy shots in [place] - Allergy profile 07/18/2017 >  Eos 0.0 /  IgE  197  Pos RAST Trees/grass/ ragweed - cyclical gerd rx  07/18/2017 >  95% improved 08/06/2017 > continue gerd rx plus 1st gen H1 blockers per guidelines   - Spirometry 09/03/2017  wnl with nl curvature to f/v loop  - added singulair 10 mg daily 09/03/2017 and rec complete the gi w/u before next steps - Sinus CT 09/12/2017 > neg  - 10/10/2017 improved on H1 bid and singulair combined - 02/04/2018 added dymista trial in place of flonase > chlorpheniramine worked the best as of 02/09/2019 so rec continue singulair / 1st gen H1 blockers per guidelines  And f/u prn    Adequate control on present rx, reviewed in detail with pt > no change in rx needed    F/u can be prn and refills per PCP

## 2019-02-09 NOTE — Patient Instructions (Signed)
No change in medications though it's ok to reduce the chlorpheniramine and/ or adjust it up if worse to a maximum of 12 pills a day to control nasal drainage, urge to clear your throat.  You can buy them 1000 at a time on [facility] and pay about a penny a pill if you want to.   If you are satisfied with your treatment plan,  let your doctor know and he/she can either refill your medications   or you can return here when your prescription runs out - I have  Refilled Singulair  for one year today    If in any way you are not 100% satisfied,  please tell us.  If 100% better, tell your friends!  Pulmonary follow up is as needed

## 2019-02-09 NOTE — Assessment & Plan Note (Signed)
Tolerating walking will hills s sob and cough managed with singulair/ 1st gen H1 blockers per guidelines     F/u with pfts prn if directed to do so by rheumatology

## 2019-02-17 DIAGNOSIS — M9902 Segmental and somatic dysfunction of thoracic region: Secondary | ICD-10-CM | POA: Diagnosis not present

## 2019-02-17 DIAGNOSIS — M5432 Sciatica, left side: Secondary | ICD-10-CM | POA: Diagnosis not present

## 2019-02-17 DIAGNOSIS — M9903 Segmental and somatic dysfunction of lumbar region: Secondary | ICD-10-CM | POA: Diagnosis not present

## 2019-02-24 DIAGNOSIS — M0579 Rheumatoid arthritis with rheumatoid factor of multiple sites without organ or systems involvement: Secondary | ICD-10-CM | POA: Diagnosis not present

## 2019-02-24 DIAGNOSIS — Z79899 Other long term (current) drug therapy: Secondary | ICD-10-CM | POA: Diagnosis not present

## 2019-03-04 ENCOUNTER — Other Ambulatory Visit: Payer: Self-pay | Admitting: Family Medicine

## 2019-03-13 DIAGNOSIS — M170 Bilateral primary osteoarthritis of knee: Secondary | ICD-10-CM | POA: Diagnosis not present

## 2019-03-13 DIAGNOSIS — M0579 Rheumatoid arthritis with rheumatoid factor of multiple sites without organ or systems involvement: Secondary | ICD-10-CM | POA: Diagnosis not present

## 2019-03-13 DIAGNOSIS — M2550 Pain in unspecified joint: Secondary | ICD-10-CM | POA: Diagnosis not present

## 2019-03-13 DIAGNOSIS — Z79899 Other long term (current) drug therapy: Secondary | ICD-10-CM | POA: Diagnosis not present

## 2019-03-24 DIAGNOSIS — M0579 Rheumatoid arthritis with rheumatoid factor of multiple sites without organ or systems involvement: Secondary | ICD-10-CM | POA: Diagnosis not present

## 2019-03-25 ENCOUNTER — Ambulatory Visit (INDEPENDENT_AMBULATORY_CARE_PROVIDER_SITE_OTHER): Payer: Federal, State, Local not specified - PPO | Admitting: Family Medicine

## 2019-03-25 ENCOUNTER — Ambulatory Visit: Payer: Federal, State, Local not specified - PPO | Admitting: Family Medicine

## 2019-03-25 ENCOUNTER — Other Ambulatory Visit: Payer: Self-pay

## 2019-03-25 ENCOUNTER — Encounter: Payer: Self-pay | Admitting: Family Medicine

## 2019-03-25 DIAGNOSIS — K529 Noninfective gastroenteritis and colitis, unspecified: Secondary | ICD-10-CM

## 2019-03-25 MED ORDER — ONDANSETRON 8 MG PO TBDP: 8.0000 mg | ORAL_TABLET | Freq: Three times a day (TID) | ORAL | 0 refills | Status: DC | PRN

## 2019-03-25 NOTE — Progress Notes (Signed)
Virtual Visit via telephone Note Due to COVID-19, visit is conducted virtually and was requested by patient. This visit type was conducted due to national recommendations for restrictions regarding the COVID-19 Pandemic (e.g. social distancing) in an effort to limit this patient's exposure and mitigate transmission in our community. All issues noted in this document were discussed and addressed.  A physical exam was not performed with this format.   I connected with Jean Hernandez on 03/25/19 at 1325 by telephone and verified that I am speaking with the correct person using two identifiers. Jean Hernandez is currently located at home and family is currently with them during visit. The provider, Kari Baars, FNP is located in their office at time of visit.  I discussed the limitations, risks, security and privacy concerns of performing an evaluation and management service by telephone and the availability of in person appointments. I also discussed with the patient that there may be a patient responsible charge related to this service. The patient expressed understanding and agreed to proceed.  Subjective:  Patient ID: Jean Hernandez, female    DOB: 04/04/1971, 48 y.o.   MRN: [id number]  Chief Complaint:  Vomiting and Diarrhea   HPI: Jean Hernandez is a 48 y.o. female presenting on 03/25/2019 for Vomiting and Diarrhea   Pt states she thinks she has food poisoning. States she ate at [employer] last night. States around 0300 this morning she woke up with diarrhea and vomiting. States she has vomited several times and had diarrhea several times. States she has tried imodium without relief of the diarrhea. She at potatoes and 5 hours later vomited. Denies blood in stool or vomit. No one else in the house is sick. No fever, chills, confusion, or weakness.   Diarrhea   This is a new problem. The current episode started today. The problem occurs 5 to 10 times per day. The problem has been waxing and waning.  The stool consistency is described as watery. The patient states that diarrhea awakens her from sleep. Associated symptoms include abdominal pain and vomiting. Pertinent negatives include no arthralgias, bloating, chills, coughing, fever, headaches, increased  flatus, myalgias, sweats, URI or weight loss. Risk factors include suspect food intake. She has tried anti-motility drug for the symptoms. The treatment provided no relief.  Emesis   This is a new problem. The current episode started today. The problem occurs 2 to 4 times per day. The problem has been waxing and waning. The emesis has an appearance of stomach contents. There has been no fever. Associated symptoms include abdominal pain and diarrhea. Pertinent negatives include no arthralgias, chest pain, chills, coughing, dizziness, fever, headaches, myalgias, sweats, URI or weight loss. Risk factors include suspect food intake. She has tried nothing for the symptoms.     Relevant past medical, surgical, family, and social history reviewed and updated as indicated.  Allergies and medications reviewed and updated.   Past Medical History:  Diagnosis Date  . Rheumatoid arthritis [facility])     Past Surgical History:  Procedure Laterality Date  . ABDOMINAL HYSTERECTOMY    . C sections     x2  . colonsocopy  2012    Social History   Socioeconomic History  . Marital status: Married    Spouse name: Not on file  . Number of children: Not on file  . Years of education: Not on file  . Highest education level: Not on file  Occupational History  . Not on file  Social Needs  . Financial resource strain: Not on file  . Food insecurity:    Worry: Not on file    Inability: Not on file  . Transportation needs:    Medical: Not on file    Non-medical: Not on file  Tobacco Use  . Smoking status: Never Smoker  . Smokeless tobacco: Never Used  Substance and Sexual Activity  . Alcohol use: No    Alcohol/week: 0.0 standard drinks  . Drug use:  No  . Sexual activity: Not on file  Lifestyle  . Physical activity:    Days per week: Not on file    Minutes per session: Not on file  . Stress: Not on file  Relationships  . Social connections:    Talks on phone: Not on file    Gets together: Not on file    Attends religious service: Not on file    Active member of club or organization: Not on file    Attends meetings of clubs or organizations: Not on file    Relationship status: Not on file  . Intimate partner violence:    Fear of current or ex partner: Not on file    Emotionally abused: Not on file    Physically abused: Not on file    Forced sexual activity: Not on file  Other Topics Concern  . Not on file  Social History Narrative  . Not on file    Outpatient Encounter Medications as of 03/25/2019  Medication Sig  . Calcium Citrate (CITRACAL PO) Take 1 tablet by mouth 2 (two) times daily.  . Certolizumab Pegol (CIMZIA [place]) Inject into the skin.  . chlorpheniramine (CHLOR-TRIMETON) 4 MG tablet Take 8 mg by mouth at bedtime. And as needed during the day every 4 hours  . fluticasone (FLONASE) 50 MCG/ACT nasal spray Place 2 sprays into both nostrils daily.  Marland Kitchen leflunomide (ARAVA) 20 MG tablet On tablet  . levothyroxine (SYNTHROID) 50 MCG tablet TAKE 1 TABLET DAILY  . montelukast (SINGULAIR) 10 MG tablet Take 1 tablet (10 mg total) by mouth at bedtime.  . naproxen (NAPROSYN) 500 MG tablet Take 500 mg by mouth as needed.  . ondansetron (ZOFRAN-ODT) 8 MG disintegrating tablet Take 1 tablet (8 mg total) by mouth every 8 (eight) hours as needed for nausea or vomiting.  . Teriparatide, Recombinant, (FORTEO) 600 MCG/2.4ML SOLN Inject 20 mcg into the skin daily.   No facility-administered encounter medications on file as of 03/25/2019.     Allergies  Allergen Reactions  . Amoxicillin Itching and Rash    REACTION: hives  . Other Itching    Dilaudid  . Oxycodone-Acetaminophen Itching and Rash  . Percocet [Oxycodone-Acetaminophen]  Itching and Rash  . Ultracet [Tramadol-Acetaminophen] Itching, Rash and Hives    REACTION: itching  . Vicodin [Hydrocodone-Acetaminophen] Itching and Hives  . Propoxyphene N-Acetaminophen     REACTION: itching    Review of Systems  Constitutional: Negative for activity change, appetite change, chills, diaphoresis, fatigue, fever, unexpected weight change and weight loss.  Respiratory: Negative for cough and shortness of breath.   Cardiovascular: Negative for chest pain and palpitations.  Gastrointestinal: Positive for abdominal pain, diarrhea, nausea and vomiting. Negative for abdominal distention, anal bleeding, bloating, blood in stool, constipation, flatus and rectal pain.  Genitourinary: Negative for decreased urine volume and difficulty urinating.  Musculoskeletal: Negative for arthralgias and myalgias.  Skin: Negative for color change.  Neurological: Negative for dizziness, weakness and headaches.  Psychiatric/Behavioral: Negative for confusion.  All other  systems reviewed and are negative.        Observations/Objective: No vital signs or physical exam, this was a telephone or virtual health encounter.  Pt alert and oriented, answers all questions appropriately, and able to speak in full sentences.    Assessment and Plan: Kenijah was seen today for vomiting and diarrhea.  Diagnoses and all orders for this visit:  Gastroenteritis Reported signs and symptoms consistent with gastroenteritis likely due to food. Symptomatic care discussed. BRAT diet. Zofran as needed for nausea and vomiting. Adequate hydration. Report any new or worsening symptoms. If diarrhea lasts longer than 3-5 days, call the office.  -     ondansetron (ZOFRAN-ODT) 8 MG disintegrating tablet; Take 1 tablet (8 mg total) by mouth every 8 (eight) hours as needed for nausea or vomiting.     Follow Up Instructions: Return if symptoms worsen or fail to improve.    I discussed the assessment and treatment  plan with the patient. The patient was provided an opportunity to ask questions and all were answered. The patient agreed with the plan and demonstrated an understanding of the instructions.   The patient was advised to call back or seek an in-person evaluation if the symptoms worsen or if the condition fails to improve as anticipated.  The above assessment and management plan was discussed with the patient. The patient verbalized understanding of and has agreed to the management plan. Patient is aware to call the clinic if symptoms persist or worsen. Patient is aware when to return to the clinic for a follow-up visit. Patient educated on when it is appropriate to go to the emergency department.    I provided 15 minutes of non-face-to-face time during this encounter. The call started at 1325. The call ended at 1340. The other time was used for coordination of care.    Monia Pouch, FNP-C [facility] [address] [place], [place] [address] [phone]

## 2019-03-26 ENCOUNTER — Encounter

## 2019-03-26 ENCOUNTER — Encounter: Payer: Self-pay | Admitting: Family Medicine

## 2019-03-26 ENCOUNTER — Telehealth (INDEPENDENT_AMBULATORY_CARE_PROVIDER_SITE_OTHER): Payer: Federal, State, Local not specified - PPO | Admitting: Family Medicine

## 2019-03-26 DIAGNOSIS — E669 Obesity, unspecified: Secondary | ICD-10-CM | POA: Diagnosis not present

## 2019-03-26 DIAGNOSIS — M0579 Rheumatoid arthritis with rheumatoid factor of multiple sites without organ or systems involvement: Secondary | ICD-10-CM

## 2019-03-26 MED ORDER — PHENTERMINE HCL 15 MG PO CAPS: 15.0000 mg | ORAL_CAPSULE | ORAL | 2 refills | Status: DC

## 2019-03-26 NOTE — Progress Notes (Signed)
Subjective:    Patient ID: Jean Hernandez, female    DOB: 06/20/1971, 48 y.o.   MRN: [id number]   HPI: Jean Hernandez is a 48 y.o. female presenting for need to lose weight. Lost 40 lb last year. Got off track due to various circumstances and regained 12 lb. Now restarting her diet, but needs a boost to get started. Wants to keep the dose down as it caused some interference with sleep before. Taking specialty drugs for RA. Needs to lose weight to further help with arthritis sx    Depression screen [facility] 2/9 10/21/2018 09/24/2018 07/01/2018 07/01/2018 05/22/2018  Decreased Interest 0 0 0 0 0  Down, Depressed, Hopeless 0 0 0 0 0  PHQ - 2 Score 0 0 0 0 0  Altered sleeping 0 - - - -  Tired, decreased energy 0 - - - -  Change in appetite 0 - - - -  Feeling bad or failure about yourself  0 - - - -  Trouble concentrating 0 - - - -  Moving slowly or fidgety/restless 0 - - - -  PHQ-9 Score 0 - - - -  Difficult doing work/chores Not difficult at all - - - -     Relevant past medical, surgical, family and social history reviewed and updated as indicated.  Interim medical history since our last visit reviewed. Allergies and medications reviewed and updated.  ROS:  Review of Systems  Constitutional: Negative.  Negative for fever.  HENT: Negative.   Eyes: Negative for visual disturbance.  Respiratory: Positive for cough (recent URI). Negative for shortness of breath.   Cardiovascular: Negative for chest pain.  Gastrointestinal: Negative for abdominal pain.  Musculoskeletal: Positive for arthralgias.     Social History   Tobacco Use  Smoking Status Never Smoker  Smokeless Tobacco Never Used       Objective:     Wt Readings from Last 3 Encounters:  10/21/18 198 lb (89.8 kg)  09/24/18 189 lb 3.2 oz (85.8 kg)  07/01/18 196 lb 6 oz (89.1 kg)     Exam deferred. Pt. [place] due to COVID 19. Phone visit performed.   Assessment & Plan:   1. Obesity (BMI 30-39.9)   2. Rheumatoid  arthritis involving multiple sites with positive rheumatoid factor ([place])     Meds ordered this encounter  Medications  . phentermine 15 MG capsule    Sig: Take 1 capsule (15 mg total) by mouth every morning.    Dispense:  30 capsule    Refill:  2    No orders of the defined types were placed in this encounter.     Diagnoses and all orders for this visit:  Obesity (BMI 30-39.9)  Rheumatoid arthritis involving multiple sites with positive rheumatoid factor ([place])  Other orders -     phentermine 15 MG capsule; Take 1 capsule (15 mg total) by mouth every morning.    Virtual Visit via telephone Note  I discussed the limitations, risks, security and privacy concerns of performing an evaluation and management service by telephone and the availability of in person appointments. The patient was identified with two identifiers. Pt.expressed understanding and agreed to proceed. Pt. Is at home. Dr. Livia Snellen is in his office.  Follow Up Instructions:   I discussed the assessment and treatment plan with the patient. The patient was provided an opportunity to ask questions and all were answered. The patient agreed with the plan and demonstrated an understanding of the instructions.  The patient was advised to call back or seek an in-person evaluation if the symptoms worsen or if the condition fails to improve as anticipated.   Total minutes including chart review and phone contact time: 12   Follow up plan: Return if symptoms worsen or fail to improve.  Claretta Fraise, MD [place]

## 2019-04-21 DIAGNOSIS — M0579 Rheumatoid arthritis with rheumatoid factor of multiple sites without organ or systems involvement: Secondary | ICD-10-CM | POA: Diagnosis not present

## 2019-05-13 DIAGNOSIS — Z20828 Contact with and (suspected) exposure to other viral communicable diseases: Secondary | ICD-10-CM | POA: Diagnosis not present

## 2019-05-19 DIAGNOSIS — M0579 Rheumatoid arthritis with rheumatoid factor of multiple sites without organ or systems involvement: Secondary | ICD-10-CM | POA: Diagnosis not present

## 2019-05-19 DIAGNOSIS — Z79899 Other long term (current) drug therapy: Secondary | ICD-10-CM | POA: Diagnosis not present

## 2019-05-22 DIAGNOSIS — L820 Inflamed seborrheic keratosis: Secondary | ICD-10-CM | POA: Diagnosis not present

## 2019-05-22 DIAGNOSIS — D1801 Hemangioma of skin and subcutaneous tissue: Secondary | ICD-10-CM | POA: Diagnosis not present

## 2019-05-22 DIAGNOSIS — D2261 Melanocytic nevi of right upper limb, including shoulder: Secondary | ICD-10-CM | POA: Diagnosis not present

## 2019-05-22 DIAGNOSIS — L858 Other specified epidermal thickening: Secondary | ICD-10-CM | POA: Diagnosis not present

## 2019-06-05 ENCOUNTER — Other Ambulatory Visit: Payer: Self-pay | Admitting: Internal Medicine

## 2019-06-16 DIAGNOSIS — M0579 Rheumatoid arthritis with rheumatoid factor of multiple sites without organ or systems involvement: Secondary | ICD-10-CM | POA: Diagnosis not present

## 2019-07-04 DIAGNOSIS — J0100 Acute maxillary sinusitis, unspecified: Secondary | ICD-10-CM | POA: Diagnosis not present

## 2019-07-09 DIAGNOSIS — Z79899 Other long term (current) drug therapy: Secondary | ICD-10-CM | POA: Diagnosis not present

## 2019-07-09 DIAGNOSIS — M2550 Pain in unspecified joint: Secondary | ICD-10-CM | POA: Diagnosis not present

## 2019-07-09 DIAGNOSIS — M170 Bilateral primary osteoarthritis of knee: Secondary | ICD-10-CM | POA: Diagnosis not present

## 2019-07-09 DIAGNOSIS — M0579 Rheumatoid arthritis with rheumatoid factor of multiple sites without organ or systems involvement: Secondary | ICD-10-CM | POA: Diagnosis not present

## 2019-07-14 DIAGNOSIS — M0579 Rheumatoid arthritis with rheumatoid factor of multiple sites without organ or systems involvement: Secondary | ICD-10-CM | POA: Diagnosis not present

## 2019-07-28 DIAGNOSIS — N6320 Unspecified lump in the left breast, unspecified quadrant: Secondary | ICD-10-CM | POA: Diagnosis not present

## 2019-07-28 DIAGNOSIS — N6310 Unspecified lump in the right breast, unspecified quadrant: Secondary | ICD-10-CM | POA: Diagnosis not present

## 2019-07-29 ENCOUNTER — Other Ambulatory Visit: Payer: Self-pay | Admitting: Obstetrics and Gynecology

## 2019-07-29 DIAGNOSIS — N644 Mastodynia: Secondary | ICD-10-CM

## 2019-07-29 DIAGNOSIS — R457 State of emotional shock and stress, unspecified: Secondary | ICD-10-CM | POA: Diagnosis not present

## 2019-07-29 DIAGNOSIS — N6320 Unspecified lump in the left breast, unspecified quadrant: Secondary | ICD-10-CM

## 2019-07-29 DIAGNOSIS — G4489 Other headache syndrome: Secondary | ICD-10-CM | POA: Diagnosis not present

## 2019-07-31 ENCOUNTER — Ambulatory Visit
Admission: RE | Admit: 2019-07-31 | Discharge: 2019-07-31 | Disposition: A | Discharge status: Home / Self Care | Payer: Federal, State, Local not specified - PPO | Source: Ambulatory Visit | Attending: Obstetrics and Gynecology | Admitting: Obstetrics and Gynecology

## 2019-07-31 ENCOUNTER — Other Ambulatory Visit: Payer: Self-pay

## 2019-07-31 DIAGNOSIS — N6320 Unspecified lump in the left breast, unspecified quadrant: Secondary | ICD-10-CM

## 2019-07-31 DIAGNOSIS — N644 Mastodynia: Secondary | ICD-10-CM

## 2019-07-31 DIAGNOSIS — R928 Other abnormal and inconclusive findings on diagnostic imaging of breast: Secondary | ICD-10-CM | POA: Diagnosis not present

## 2019-07-31 DIAGNOSIS — N6489 Other specified disorders of breast: Secondary | ICD-10-CM | POA: Diagnosis not present

## 2019-08-11 DIAGNOSIS — M0579 Rheumatoid arthritis with rheumatoid factor of multiple sites without organ or systems involvement: Secondary | ICD-10-CM | POA: Diagnosis not present

## 2019-08-14 DIAGNOSIS — M25562 Pain in left knee: Secondary | ICD-10-CM | POA: Diagnosis not present

## 2019-08-14 DIAGNOSIS — M1712 Unilateral primary osteoarthritis, left knee: Secondary | ICD-10-CM | POA: Diagnosis not present

## 2019-08-26 ENCOUNTER — Other Ambulatory Visit: Payer: Self-pay

## 2019-08-27 ENCOUNTER — Ambulatory Visit (INDEPENDENT_AMBULATORY_CARE_PROVIDER_SITE_OTHER): Payer: Federal, State, Local not specified - PPO | Admitting: Family Medicine

## 2019-08-27 ENCOUNTER — Encounter: Payer: Self-pay | Admitting: Family Medicine

## 2019-08-27 VITALS — BP 138/97 | HR 124 | Ht 67.0 in | Wt 218.0 lb

## 2019-08-27 DIAGNOSIS — R05 Cough: Secondary | ICD-10-CM | POA: Diagnosis not present

## 2019-08-27 DIAGNOSIS — M069 Rheumatoid arthritis, unspecified: Secondary | ICD-10-CM | POA: Diagnosis not present

## 2019-08-27 DIAGNOSIS — R058 Other specified cough: Secondary | ICD-10-CM

## 2019-08-27 DIAGNOSIS — E039 Hypothyroidism, unspecified: Secondary | ICD-10-CM

## 2019-08-27 MED ORDER — TRAZODONE HCL 150 MG PO TABS: ORAL_TABLET | ORAL | 5 refills | Status: DC

## 2019-08-27 NOTE — Progress Notes (Signed)
Subjective:  Patient ID: Jean Hernandez, female    DOB: October 24, 1970  Age: 48 y.o. MRN: [id number]  CC: Follow-up (surgical clearance)   HPI Jean Hernandez presents for clearance for upcoming knee replacement with Dr. Almyra Deforest. Took chlorpheniramine for cough and thinks that must have pushed up her BP. Her chronic cough has been dxed as benign by pulmonology Dr. Melvyn Novas. She is not dyspneic.   RA is stable on current regimen. She is taking Arava and simzia for that.   follow-up on  thyroid. The patient has a history of hypothyroidism for many years. It has been stable recently. Pt. denies any change in  voice, loss of hair, heat or cold intolerance. Energy level has been adequate to good. Patient denies constipation and diarrhea. No myxedema. Medication is as noted below. Verified that pt is taking it daily on an empty stomach. Well tolerated.   Depression screen [facility] 2/9 08/27/2019 10/21/2018 09/24/2018  Decreased Interest 0 0 0  Down, Depressed, Hopeless 0 0 0  PHQ - 2 Score 0 0 0  Altered sleeping - 0 -  Tired, decreased energy - 0 -  Change in appetite - 0 -  Feeling bad or failure about yourself  - 0 -  Trouble concentrating - 0 -  Moving slowly or fidgety/restless - 0 -  PHQ-9 Score - 0 -  Difficult doing work/chores - Not difficult at all -    History Jean Hernandez has a past medical history of Rheumatoid arthritis ([place]).   She has a past surgical history that includes C sections; colonsocopy (2012); and Abdominal hysterectomy.   Her family history includes Allergies in her father, mother, and sister; Dementia in her father; Diabetes in her father; Early death in her father; Heart disease in her father; Hypertension in her mother.She reports that she has never smoked. She has never used smokeless tobacco. She reports that she does not drink alcohol or use drugs.    ROS Review of Systems  Psychiatric/Behavioral: Positive for sleep disturbance (awake 90% of the time, verified by her fit  Bit).    Objective:  BP (!) 138/97   Pulse (!) 124   Ht 5\' 7"  (1.702 m)   Wt 218 lb (98.9 kg)   SpO2 95%   BMI 34.14 kg/m   BP Readings from Last 3 Encounters:  08/27/19 (!) 138/97  10/21/18 136/83  09/24/18 (!) 130/92    Wt Readings from Last 3 Encounters:  08/27/19 218 lb (98.9 kg)  10/21/18 198 lb (89.8 kg)  09/24/18 189 lb 3.2 oz (85.8 kg)     Physical Exam    Assessment & Plan:   Jean Hernandez was seen today for follow-up.  Diagnoses and all orders for this visit:  Rheumatoid arthritis involving multiple sites, unspecified whether rheumatoid factor present (HCC) -     CBC -     CMP  Upper airway cough syndrome  Hypothyroidism, unspecified type -     CBC -     CMP -     TSH + free T4  Other orders -     traZODone (DESYREL) 150 MG tablet; Use from 1/3 to 1 tablet nightly as needed for sleep.       I have discontinued Jean Hernandez. Jean Hernandez's fluticasone, ondansetron, and phentermine. I am also having her start on traZODone. Additionally, I am having her maintain her naproxen, Calcium Citrate (CITRACAL PO), chlorpheniramine, Teriparatide (Recombinant), Certolizumab Pegol (CIMZIA [place]), leflunomide, montelukast, levothyroxine, and pantoprazole.  Allergies as of 08/27/2019  Reactions   Amoxicillin Itching, Rash   REACTION: hives   Other Itching   Dilaudid   Oxycodone-acetaminophen Itching, Rash   Percocet [oxycodone-acetaminophen] Itching, Rash   Ultracet [tramadol-acetaminophen] Itching, Rash, Hives   REACTION: itching   Vicodin [hydrocodone-acetaminophen] Itching, Hives   Propoxyphene N-acetaminophen    REACTION: itching      Medication List       Accurate as of August 27, 2019  2:33 PM. If you have any questions, ask your nurse or doctor.        STOP taking these medications   fluticasone 50 MCG/ACT nasal spray Commonly known as: FLONASE Stopped by: Mechele Claude, MD   ondansetron 8 MG disintegrating tablet Commonly known as: ZOFRAN-ODT  Stopped by: Mechele Claude, MD   phentermine 15 MG capsule Stopped by: Mechele Claude, MD     TAKE these medications   chlorpheniramine 4 MG tablet Commonly known as: CHLOR-TRIMETON Take 8 mg by mouth at bedtime. And as needed during the day every 4 hours   CIMZIA [place] Inject into the skin.   CITRACAL PO Take 1 tablet by mouth 2 (two) times daily.   Forteo 600 MCG/2.4ML Soln Generic drug: Teriparatide (Recombinant) Inject 20 mcg into the skin daily.   leflunomide 20 MG tablet Commonly known as: ARAVA On tablet   levothyroxine 50 MCG tablet Commonly known as: SYNTHROID TAKE 1 TABLET DAILY   montelukast 10 MG tablet Commonly known as: SINGULAIR Take 1 tablet (10 mg total) by mouth at bedtime.   naproxen 500 MG tablet Commonly known as: NAPROSYN Take 500 mg by mouth as needed.   pantoprazole 40 MG tablet Commonly known as: PROTONIX TAKE 1 TABLET TWICE DAILY   traZODone 150 MG tablet Commonly known as: DESYREL Use from 1/3 to 1 tablet nightly as needed for sleep. Started by: Mechele Claude, MD        Follow-up: No follow-ups on file.  Mechele Claude, M.D.

## 2019-08-28 LAB — CMP14+EGFR
ALT: 23 IU/L (ref 0–32)
AST: 25 IU/L (ref 0–40)
Albumin/Globulin Ratio: 1.8 (ref 1.2–2.2)
Albumin: 4.4 g/dL (ref 3.8–4.8)
Alkaline Phosphatase: 99 IU/L (ref 39–117)
BUN/Creatinine Ratio: 12 (ref 9–23)
BUN: 13 mg/dL (ref 6–24)
Bilirubin Total: 0.3 mg/dL (ref 0.0–1.2)
CO2: 21 mmol/L (ref 20–29)
Calcium: 9.6 mg/dL (ref 8.7–10.2)
Chloride: 102 mmol/L (ref 96–106)
Creatinine, Ser: 1.05 mg/dL — ABNORMAL HIGH (ref 0.57–1.00)
GFR calc Af Amer: 72 mL/min/{1.73_m2} (ref >59)
GFR calc non Af Amer: 62 mL/min/{1.73_m2} (ref >59)
Globulin, Total: 2.4 g/dL (ref 1.5–4.5)
Glucose: 162 mg/dL — ABNORMAL HIGH (ref 65–99)
Potassium: 4 mmol/L (ref 3.5–5.2)
Sodium: 139 mmol/L (ref 134–144)
Total Protein: 6.8 g/dL (ref 6.0–8.5)

## 2019-08-28 LAB — CBC WITH DIFFERENTIAL/PLATELET
Basophils Absolute: 0 10*3/uL (ref 0.0–0.2)
Basos: 0 %
EOS (ABSOLUTE): 0.2 10*3/uL (ref 0.0–0.4)
Eos: 2 %
Hematocrit: 45.5 % (ref 34.0–46.6)
Hemoglobin: 15.3 g/dL (ref 11.1–15.9)
Immature Grans (Abs): 0 10*3/uL (ref 0.0–0.1)
Immature Granulocytes: 0 %
Lymphocytes Absolute: 3.3 10*3/uL — ABNORMAL HIGH (ref 0.7–3.1)
Lymphs: 37 %
MCH: 30.9 pg (ref 26.6–33.0)
MCHC: 33.6 g/dL (ref 31.5–35.7)
MCV: 92 fL (ref 79–97)
Monocytes Absolute: 0.7 10*3/uL (ref 0.1–0.9)
Monocytes: 8 %
Neutrophils Absolute: 4.6 10*3/uL (ref 1.4–7.0)
Neutrophils: 53 %
Platelets: 217 10*3/uL (ref 150–450)
RBC: 4.95 x10E6/uL (ref 3.77–5.28)
RDW: 11.9 % (ref 11.7–15.4)
WBC: 8.9 10*3/uL (ref 3.4–10.8)

## 2019-08-28 LAB — TSH+FREE T4
Free T4: 1.09 ng/dL (ref 0.82–1.77)
TSH: 1.52 u[IU]/mL (ref 0.450–4.500)

## 2019-08-31 NOTE — Progress Notes (Signed)
Hello [place],  Your lab result is normal and/or stable.Some minor variations that are not significant are commonly marked abnormal, but do not represent any medical problem for you.  Best regards, Shamere Campas, M.D.

## 2019-09-01 ENCOUNTER — Telehealth: Payer: Self-pay | Admitting: Family Medicine

## 2019-09-01 NOTE — Telephone Encounter (Signed)
Patient paperwork found and faxed to [facility] - Attn:  Glendale Chard.  Spoke with patient and assured her everything was handled and apologized for her frustration.

## 2019-09-01 NOTE — Telephone Encounter (Signed)
Patient very argumentive.  Declares the visit with provider was for her surgery clearance and to have form signed and sent to orthopedics to have knee surgery.  It was explained that we did not do ecg, chest x-ray or other required tests and a form had not been  done,(to my knowledge).   She became more hostile and said she would not pay for another visit because we should have done everything during last visit.   She began talking bad about our care then hung up.  Please advise on next step with patient.

## 2019-09-01 NOTE — Telephone Encounter (Signed)
Patient was seen on 08/27/19 and said that she faxed [place] over documentation regarding clearance for her to have surgery. Wants to know if we received it and if it has been filled out.

## 2019-09-01 NOTE — Telephone Encounter (Signed)
Yes it has been received and completed and should be in my out box waiting to be faxed. The EKG and x-ray are not required. If needed Anesthesia will do them at their pre-op. WS

## 2019-09-08 DIAGNOSIS — Z79899 Other long term (current) drug therapy: Secondary | ICD-10-CM | POA: Diagnosis not present

## 2019-09-08 DIAGNOSIS — M0579 Rheumatoid arthritis with rheumatoid factor of multiple sites without organ or systems involvement: Secondary | ICD-10-CM | POA: Diagnosis not present

## 2019-09-09 ENCOUNTER — Telehealth: Payer: Self-pay

## 2019-09-09 NOTE — Telephone Encounter (Signed)
Patient called office requesting to come in and have her BP checked. She states that at her last office visit Dr Livia Snellen told her she could come back in and have rechecked if she wanted to. Offered patient an appointment with triage at 2:45 and she stated that she had to take her daughter to the eye dr and asked if there was anything else available. Offered a 3:45 and she said she didn't understand why she couldn't just come to office now. I advised that due to Covid we have restrictions and we need to have an appointment for her. She stated to just forget it and she would just live with it. I advised patient if she changed her mind to contact our office.

## 2019-09-12 ENCOUNTER — Other Ambulatory Visit: Payer: Self-pay | Admitting: Family Medicine

## 2019-09-18 DIAGNOSIS — Z13820 Encounter for screening for osteoporosis: Secondary | ICD-10-CM | POA: Diagnosis not present

## 2019-09-18 DIAGNOSIS — Z01419 Encounter for gynecological examination (general) (routine) without abnormal findings: Secondary | ICD-10-CM | POA: Diagnosis not present

## 2019-09-18 DIAGNOSIS — Z6834 Body mass index (BMI) 34.0-34.9, adult: Secondary | ICD-10-CM | POA: Diagnosis not present

## 2019-09-21 NOTE — Patient Instructions (Signed)
DUE TO COVID-19 ONLY ONE VISITOR IS ALLOWED TO COME WITH YOU AND STAY IN THE WAITING ROOM ONLY DURING PRE OP AND PROCEDURE DAY OF SURGERY. THE 1 VISITOR MAY VISIT WITH YOU AFTER SURGERY IN YOUR PRIVATE ROOM DURING VISITING HOURS ONLY!  YOU NEED TO HAVE A COVID 19 TEST ON_______ @_______ , THIS TEST MUST BE DONE BEFORE SURGERY, COME  [address], [place] , [phone].  [facility]) ONCE YOUR COVID TEST IS COMPLETED, PLEASE BEGIN THE QUARANTINE INSTRUCTIONS AS OUTLINED IN YOUR HANDOUT.                Jean CampsSarah E Hernandez  09/21/2019   Your procedure is scheduled on: 09-28-19   [facility]   Report to admitting at      1215  PM     Call this number if you have problems the morning of surgery [phone]    Remember: NO SOLID FOOD AFTER MIDNIGHT THE NIGHT PRIOR TO SURGERY. NOTHING BY MOUTH EXCEPT CLEAR LIQUIDS UNTIL     1145 am  . PLEASE FINISH ENSURE DRINK PER SURGEON ORDER  WHICH NEEDS TO BE COMPLETED AT   1145 am then nothing by mouth .    CLEAR LIQUID DIET   Foods Allowed                                                                     Foods Excluded  Coffee and tea, regular and decaf                             liquids that you cannot  Plain Jell-O any favor except red or purple                                           see through such as: Fruit ices (not with fruit pulp)                                     milk, soups, orange juice  Iced Popsicles                                    All solid food Carbonated beverages, regular and diet                                    Cranberry, grape and apple juices Sports drinks like Gatorade Lightly seasoned clear broth or consume(fat free) Sugar, honey syrup   _____________________________________________________________________    Take these medicines the morning of surgery with A SIP OF WATER: protonix, levothyroxine                                 You may not have any metal on  your body including hair pins and  piercings  Do not wear jewelry, make-up, lotions, powders or perfumes, deodorant             Do not wear nail polish on your fingernails.  Do not shave  48 hours prior to surgery.     Do not bring valuables to the hospital. [facility] IS NOT             RESPONSIBLE   FOR VALUABLES.  Contacts, dentures or bridgework may not be worn into surgery.     Special Instructions: N/A              Please read over the following fact sheets you were given: _____________________________________________________________________          [name] - Preparing for Surgery Before surgery, you can play an important role.  Because skin is not sterile, your skin needs to be as free of germs as possible.  You can reduce the number of germs on your skin by washing with CHG (chlorahexidine gluconate) soap before surgery.  CHG is an antiseptic cleaner which kills germs and bonds with the skin to continue killing germs even after washing. Please DO NOT use if you have an allergy to CHG or antibacterial soaps.  If your skin becomes reddened/irritated stop using the CHG and inform your nurse when you arrive at Short Stay. Do not shave (including legs and underarms) for at least 48 hours prior to the first CHG shower.  You may shave your face/neck. Please follow these instructions carefully:  1.  Shower with CHG Soap the night before surgery and the  morning of Surgery.  2.  If you choose to wash your hair, wash your hair first as usual with your  normal  shampoo.  3.  After you shampoo, rinse your hair and body thoroughly to remove the  shampoo.                           4.  Use CHG as you would any other liquid soap.  You can apply chg directly  to the skin and wash                       Gently with a scrungie or clean washcloth.  5.  Apply the CHG Soap to your body ONLY FROM THE NECK DOWN.   Do not use on face/ open                           Wound or open sores. Avoid  contact with eyes, ears mouth and genitals (private parts).                       Wash face,  Genitals (private parts) with your normal soap.             6.  Wash thoroughly, paying special attention to the area where your surgery  will be performed.  7.  Thoroughly rinse your body with warm water from the neck down.  8.  DO NOT shower/wash with your normal soap after using and rinsing off  the CHG Soap.                9.  Pat yourself dry with a clean towel.            10.  Wear clean pajamas.  11.  Place clean sheets on your bed the night of your first shower and do not  sleep with pets. Day of Surgery : Do not apply any lotions/deodorants the morning of surgery.  Please wear clean clothes to the hospital/surgery center.  FAILURE TO FOLLOW THESE INSTRUCTIONS MAY RESULT IN THE CANCELLATION OF YOUR SURGERY PATIENT SIGNATURE_________________________________  NURSE SIGNATURE__________________________________  ________________________________________________________________________   Jean MireIncentive Spirometer  An incentive spirometer is a tool that can help keep your lungs clear and active. This tool measures how well you are filling your lungs with each breath. Taking long deep breaths may help reverse or decrease the chance of developing breathing (pulmonary) problems (especially infection) following:  A long period of time when you are unable to move or be active. BEFORE THE PROCEDURE   If the spirometer includes an indicator to show your best effort, your nurse or respiratory therapist will set it to a desired goal.  If possible, sit up straight or lean slightly forward. Try not to slouch.  Hold the incentive spirometer in an upright position. INSTRUCTIONS FOR USE  1. Sit on the edge of your bed if possible, or sit up as far as you can in bed or on a chair. 2. Hold the incentive spirometer in an upright position. 3. Breathe out normally. 4. Place the mouthpiece in your mouth  and seal your lips tightly around it. 5. Breathe in slowly and as deeply as possible, raising the piston or the ball toward the top of the column. 6. Hold your breath for 3-5 seconds or for as long as possible. Allow the piston or ball to fall to the bottom of the column. 7. Remove the mouthpiece from your mouth and breathe out normally. 8. Rest for a few seconds and repeat Steps 1 through 7 at least 10 times every 1-2 hours when you are awake. Take your time and take a few normal breaths between deep breaths. 9. The spirometer may include an indicator to show your best effort. Use the indicator as a goal to work toward during each repetition. 10. After each set of 10 deep breaths, practice coughing to be sure your lungs are clear. If you have an incision (the cut made at the time of surgery), support your incision when coughing by placing a pillow or rolled up towels firmly against it. Once you are able to get out of bed, walk around indoors and cough well. You may stop using the incentive spirometer when instructed by your caregiver.  RISKS AND COMPLICATIONS  Take your time so you do not get dizzy or light-headed.  If you are in pain, you may need to take or ask for pain medication before doing incentive spirometry. It is harder to take a deep breath if you are having pain. AFTER USE  Rest and breathe slowly and easily.  It can be helpful to keep track of a log of your progress. Your caregiver can provide you with a simple table to help with this. If you are using the spirometer at home, follow these instructions: SEEK MEDICAL CARE IF:   You are having difficultly using the spirometer.  You have trouble using the spirometer as often as instructed.  Your pain medication is not giving enough relief while using the spirometer.  You develop fever of 100.5 F (38.1 C) or higher. SEEK IMMEDIATE MEDICAL CARE IF:   You cough up bloody sputum that had not been present before.  You develop  fever of 102 F (38.9 C)  or greater.  You develop worsening pain at or near the incision site. MAKE SURE YOU:   Understand these instructions.  Will watch your condition.  Will get help right away if you are not doing well or get worse. Document Released: 02/11/2007 Document Revised: 12/24/2011 Document Reviewed: 04/14/2007 Exit[employer] Patient Information 2014 ExitCare, [place].   ________________________________________________________________________  WHAT IS A BLOOD TRANSFUSION? Blood Transfusion Information  A transfusion is the replacement of blood or some of its parts. Blood is made up of multiple cells which provide different functions.  Red blood cells carry oxygen and are used for blood loss replacement.  White blood cells fight against infection.  Platelets control bleeding.  Plasma helps clot blood.  Other blood products are available for specialized needs, such as hemophilia or other clotting disorders. BEFORE THE TRANSFUSION  Who gives blood for transfusions?   Healthy volunteers who are fully evaluated to make sure their blood is safe. This is blood bank blood. Transfusion therapy is the safest it has ever been in the practice of medicine. Before blood is taken from a donor, a complete history is taken to make sure that person has no history of diseases nor engages in risky social behavior (examples are intravenous drug use or sexual activity with multiple partners). The donor's travel history is screened to minimize risk of transmitting infections, such as malaria. The donated blood is tested for signs of infectious diseases, such as HIV and hepatitis. The blood is then tested to be sure it is compatible with you in order to minimize the chance of a transfusion reaction. If you or a relative donates blood, this is often done in anticipation of surgery and is not appropriate for emergency situations. It takes many days to process the donated blood. RISKS AND  COMPLICATIONS Although transfusion therapy is very safe and saves many lives, the main dangers of transfusion include:   Getting an infectious disease.  Developing a transfusion reaction. This is an allergic reaction to something in the blood you were given. Every precaution is taken to prevent this. The decision to have a blood transfusion has been considered carefully by your caregiver before blood is given. Blood is not given unless the benefits outweigh the risks. AFTER THE TRANSFUSION  Right after receiving a blood transfusion, you will usually feel much better and more energetic. This is especially true if your red blood cells have gotten low (anemic). The transfusion raises the level of the red blood cells which carry oxygen, and this usually causes an energy increase.  The nurse administering the transfusion will monitor you carefully for complications. HOME CARE INSTRUCTIONS  No special instructions are needed after a transfusion. You may find your energy is better. Speak with your caregiver about any limitations on activity for underlying diseases you may have. SEEK MEDICAL CARE IF:   Your condition is not improving after your transfusion.  You develop redness or irritation at the intravenous (IV) site. SEEK IMMEDIATE MEDICAL CARE IF:  Any of the following symptoms occur over the next 12 hours:  Shaking chills.  You have a temperature by mouth above 102 F (38.9 C), not controlled by medicine.  Chest, back, or muscle pain.  People around you feel you are not acting correctly or are confused.  Shortness of breath or difficulty breathing.  Dizziness and fainting.  You get a rash or develop hives.  You have a decrease in urine output.  Your urine turns a dark color or changes to pink, red, or  brown. Any of the following symptoms occur over the next 10 days:  You have a temperature by mouth above 102 F (38.9 C), not controlled by medicine.  Shortness of  breath.  Weakness after normal activity.  The white part of the eye turns yellow (jaundice).  You have a decrease in the amount of urine or are urinating less often.  Your urine turns a dark color or changes to pink, red, or brown. Document Released: 09/28/2000 Document Revised: 12/24/2011 Document Reviewed: 05/17/2008 [facility] Patient Information 2014 [place], [place].  _______________________________________________________________________

## 2019-09-23 NOTE — H&P (Signed)
TOTAL KNEE ADMISSION H&P  Patient is being admitted for left total knee arthroplasty.  Subjective:  Chief Complaint:left knee pain.  HPI: Jean Hernandez, 48 y.o. female, has a history of pain and functional disability in the left knee due to arthritis and has failed non-surgical conservative treatments for greater than 12 weeks to includeNSAID's and/or analgesics and activity modification.  Onset of symptoms was gradual, starting 5 years ago with gradually worsening course since that time. The patient noted no past surgery on the left knee(s).  Patient currently rates pain in the left knee(s) at 7 out of 10 with activity. Patient has worsening of pain with activity and weight bearing and pain that interferes with activities of daily living.  Patient has evidence of bone-on-bone arthritis in the medial and patellofemoral compartments of the left knee by imaging studies.  There is no active infection.  Patient Active Problem List   Diagnosis Date Noted   Rheumatoid arthritis involving multiple sites ([place]) 07/19/2017   Upper airway cough syndrome 07/18/2017   Hyperlipidemia 08/06/2008   ANAL FISSURE 08/06/2008   RECTAL BLEEDING 08/06/2008   SKIN TAG 08/06/2008   Past Medical History:  Diagnosis Date   Rheumatoid arthritis ([place])     Past Surgical History:  Procedure Laterality Date   ABDOMINAL HYSTERECTOMY     C sections     x2   colonsocopy  2012    No current facility-administered medications for this encounter.    Current Outpatient Medications  Medication Sig Dispense Refill Last Dose   Calcium Citrate (CITRACAL PO) Take 1 tablet by mouth 2 (two) times daily.      Certolizumab Pegol (CIMZIA) 2 X 200 MG KIT Inject into the skin every 30 (thirty) days.       cetirizine (ZYRTEC) 10 MG tablet Take 10 mg by mouth at bedtime.      chlorpheniramine (CHLOR-TRIMETON) 4 MG tablet Take 4 mg by mouth every 4 (four) hours.       Cholecalciferol (VITAMIN D-1000 MAX ST) 25 MCG  (1000 UT) tablet Take 1,000 Units by mouth daily.      gabapentin (NEURONTIN) 100 MG capsule Take 100 mg by mouth at bedtime.      leflunomide (ARAVA) 20 MG tablet On tablet (Patient taking differently: Take 20 mg by mouth daily. )      levothyroxine (SYNTHROID) 50 MCG tablet TAKE 1 TABLET DAILY (Patient taking differently: Take 50 mcg by mouth daily before breakfast. ) 90 tablet 3    montelukast (SINGULAIR) 10 MG tablet Take 1 tablet (10 mg total) by mouth at bedtime. 90 tablet 3    naproxen (NAPROSYN) 500 MG tablet Take 500 mg by mouth 2 (two) times daily as needed (pain).      pantoprazole (PROTONIX) 40 MG tablet TAKE 1 TABLET TWICE DAILY (Patient taking differently: Take 40 mg by mouth 2 (two) times daily. ) 180 tablet 0    teriparatide (FORTEO) 750 MCG/3ML injection Inject 20 mcg into the skin daily.       Allergies  Allergen Reactions   Amoxicillin Itching and Rash    REACTION: hives   Other Itching    Dilaudid   Oxycodone-Acetaminophen Itching and Rash   Percocet [Oxycodone-Acetaminophen] Itching and Rash   Ultracet [Tramadol-Acetaminophen] Itching, Rash and Hives    REACTION: itching   Vicodin [Hydrocodone-Acetaminophen] Itching and Hives   Propoxyphene N-Acetaminophen     REACTION: itching    Social History   Tobacco Use   Smoking status: Never Smoker  Smokeless tobacco: Never Used  Substance Use Topics   Alcohol use: No    Alcohol/week: 0.0 standard drinks    Family History  Problem Relation Age of Onset   Hypertension Mother    Allergies Mother    Diabetes Father    Dementia Father    Heart disease Father    Early death Father    Allergies Father    Allergies Sister      Review of Systems  Constitutional: Negative for chills and fever.  Respiratory: Negative for cough and shortness of breath.   Cardiovascular: Negative for chest pain and palpitations.  Gastrointestinal: Negative for nausea and vomiting.  Musculoskeletal: Positive  for joint pain.    Objective:  Physical Exam Patient is a 48 year old female.  Well nourished and well developed. General: Alert and oriented x3, cooperative and pleasant, no acute distress. Head: normocephalic, atraumatic, neck supple. Eyes: EOMI. Respiratory: breath sounds clear in all fields, no wheezing, rales, or rhonchi. Cardiovascular: Regular rate and rhythm, no murmurs, gallops or rubs. Abdomen: non-tender to palpation and soft, normoactive bowel sounds.  Musculoskeletal: Left Knee Exam: No effusion. Range of motion is 5-110 degrees. Marked crepitus on range of motion of the knee. Positive medial joint line tenderness No lateral joint line tenderness. Stable knee.  Calves soft and nontender. Motor function intact in LE. Strength 5/5 LE bilaterally. Neuro: Distal pulses 2+. Sensation to light touch intact in LE.  Vital signs in last 24 hours:  Labs:   Estimated body mass index is 34.14 kg/m as calculated from the following:   Height as of 08/27/19: '5\' 7"'$  (1.702 m).   Weight as of 08/27/19: 98.9 kg.   Imaging Review Plain radiographs demonstrate severe degenerative joint disease of the left knee(s). The overall alignment isneutral. The bone quality appears to be adequate for age and reported activity level.  Assessment/Plan:  End stage arthritis, left knee   The patient history, physical examination, clinical judgment of the provider and imaging studies are consistent with end stage degenerative joint disease of the left knee(s) and total knee arthroplasty is deemed medically necessary. The treatment options including medical management, injection therapy arthroscopy and arthroplasty were discussed at length. The risks and benefits of total knee arthroplasty were presented and reviewed. The risks due to aseptic loosening, infection, stiffness, patella tracking problems, thromboembolic complications and other imponderables were discussed. The patient acknowledged  the explanation, agreed to proceed with the plan and consent was signed. Patient is being admitted for inpatient treatment for surgery, pain control, PT, OT, prophylactic antibiotics, VTE prophylaxis, progressive ambulation and ADL's and discharge planning. The patient is planning to be discharged home.  Therapy Plans: outpatient therapy at [facility] in [place] Disposition: Home with husband Planned DVT Prophylaxis: aspirin '325mg'$  BID DME needed: walker PCP: Dr. Claretta Fraise, clearance received Rheumatologist: Dr. Gavin Pound TXA: IV Allergies: ** ALLERGIC TO TRAMADOL, OXYCODONE, DILAUDID, HYDROCODONE** - severe itching Anesthesia Concerns: none BMI: 34.5 Not diabetic  Other: ??? morphine, fentanyl, celebrex, tylenol, gabapentin as possibilities for pain management. It may be difficult to control her pain, and we discussed this at the H&P.  Patient's anticipated LOS is less than 2 midnights, meeting these requirements: - Younger than 60 - Lives within 1 hour of care - Has a competent adult at home to recover with post-op recover - NO history of  - Chronic pain requiring opiods  - Diabetes  - Coronary Artery Disease  - Heart failure  - Heart attack  - Stroke  -  DVT/VTE  - Cardiac arrhythmia  - Respiratory Failure/COPD  - Renal failure  - Anemia  - Advanced Liver disease  - Patient was instructed on what medications to stop prior to surgery. - Follow-up visit in 2 weeks with Dr. Wynelle Link - Begin physical therapy following surgery - Pre-operative lab work as pre-surgical testing - Prescriptions will be provided in hospital at time of discharge  Griffith Citron, PA-C Orthopedic Surgery [facility] [phone]

## 2019-09-24 ENCOUNTER — Other Ambulatory Visit: Payer: Self-pay

## 2019-09-24 ENCOUNTER — Other Ambulatory Visit (HOSPITAL_COMMUNITY)
Admission: RE | Admit: 2019-09-24 | Discharge: 2019-09-24 | Disposition: A | Discharge status: Home / Self Care | Payer: Federal, State, Local not specified - PPO | Source: Ambulatory Visit | Attending: Orthopedic Surgery | Admitting: Orthopedic Surgery

## 2019-09-24 ENCOUNTER — Encounter (HOSPITAL_COMMUNITY): Payer: Self-pay

## 2019-09-24 ENCOUNTER — Encounter (HOSPITAL_COMMUNITY)
Admission: RE | Admit: 2019-09-24 | Discharge: 2019-09-24 | Disposition: A | Discharge status: Home / Self Care | Payer: Federal, State, Local not specified - PPO | Source: Ambulatory Visit | Attending: Orthopedic Surgery | Admitting: Orthopedic Surgery

## 2019-09-24 DIAGNOSIS — Z01812 Encounter for preprocedural laboratory examination: Secondary | ICD-10-CM | POA: Diagnosis not present

## 2019-09-24 DIAGNOSIS — Z20828 Contact with and (suspected) exposure to other viral communicable diseases: Secondary | ICD-10-CM | POA: Insufficient documentation

## 2019-09-24 HISTORY — DX: Sleep apnea, unspecified: G47.30

## 2019-09-24 HISTORY — DX: Anxiety disorder, unspecified: F41.9

## 2019-09-24 HISTORY — DX: Gastro-esophageal reflux disease without esophagitis: K21.9

## 2019-09-24 HISTORY — DX: Unspecified asthma, uncomplicated: J45.909

## 2019-09-24 HISTORY — DX: Hypothyroidism, unspecified: E03.9

## 2019-09-24 LAB — CBC
HCT: 42.4 % (ref 36.0–46.0)
Hemoglobin: 14.6 g/dL (ref 12.0–15.0)
MCH: 32.2 pg (ref 26.0–34.0)
MCHC: 34.4 g/dL (ref 30.0–36.0)
MCV: 93.4 fL (ref 80.0–100.0)
Platelets: 221 10*3/uL (ref 150–400)
RBC: 4.54 MIL/uL (ref 3.87–5.11)
RDW: 12.2 % (ref 11.5–15.5)
WBC: 6.9 10*3/uL (ref 4.0–10.5)
nRBC: 0 % (ref 0.0–0.2)

## 2019-09-24 LAB — COMPREHENSIVE METABOLIC PANEL
ALT: 27 U/L (ref 0–44)
AST: 24 U/L (ref 15–41)
Albumin: 4.1 g/dL (ref 3.5–5.0)
Alkaline Phosphatase: 79 U/L (ref 38–126)
Anion gap: 9 (ref 5–15)
BUN: 15 mg/dL (ref 6–20)
CO2: 27 mmol/L (ref 22–32)
Calcium: 9.2 mg/dL (ref 8.9–10.3)
Chloride: 105 mmol/L (ref 98–111)
Creatinine, Ser: 0.77 mg/dL (ref 0.44–1.00)
GFR calc Af Amer: >60 mL/min (ref >60)
GFR calc non Af Amer: >60 mL/min (ref >60)
Glucose, Bld: 111 mg/dL — ABNORMAL HIGH (ref 70–99)
Potassium: 3.7 mmol/L (ref 3.5–5.1)
Sodium: 141 mmol/L (ref 135–145)
Total Bilirubin: 0.7 mg/dL (ref 0.3–1.2)
Total Protein: 7.1 g/dL (ref 6.5–8.1)

## 2019-09-24 LAB — SURGICAL PCR SCREEN
MRSA, PCR: NEGATIVE
Staphylococcus aureus: POSITIVE — AB

## 2019-09-24 LAB — PROTIME-INR
INR: 0.9 (ref 0.8–1.2)
Prothrombin Time: 11.8 seconds (ref 11.4–15.2)

## 2019-09-24 LAB — ABO/RH: ABO/RH(D): A POS

## 2019-09-24 LAB — APTT: aPTT: 39 seconds — ABNORMAL HIGH (ref 24–36)

## 2019-09-24 NOTE — Progress Notes (Addendum)
PCP - Cletus Gash stacks lov 08-27-19 on chart clearance on chart 08-31-19  Cardiologist -   Chest x-ray -  EKG -  Stress Test -  ECHO -  Cardiac Cath -   Sleep Study -  CPAP -   Fasting Blood Sugar -  Checks Blood Sugar _____ times a day  Blood Thinner Instructions: Aspirin Instructions: Last Dose:  Anesthesia review: RA    Patient denies shortness of breath, fever, cough and chest pain at PAT appointment  NONE   Patient verbalized understanding of instructions that were given to them at the PAT appointment. Patient was also instructed that they will need to review over the PAT instructions again at home before surgery.

## 2019-09-25 LAB — NOVEL CORONAVIRUS, NAA (HOSP ORDER, SEND-OUT TO REF LAB; TAT 18-24 HRS): SARS-CoV-2, NAA: NOT DETECTED

## 2019-09-27 MED ORDER — BUPIVACAINE LIPOSOME 1.3 % IJ SUSP
20.0000 mL | Freq: Once | INTRAMUSCULAR | Status: DC
  Filled 2019-09-27: qty 20

## 2019-09-28 ENCOUNTER — Encounter (HOSPITAL_COMMUNITY): Payer: Self-pay | Admitting: Orthopedic Surgery

## 2019-09-28 ENCOUNTER — Inpatient Hospital Stay (HOSPITAL_COMMUNITY): Payer: Federal, State, Local not specified - PPO | Admitting: Physician Assistant

## 2019-09-28 ENCOUNTER — Inpatient Hospital Stay (HOSPITAL_COMMUNITY)
Admission: RE | Admit: 2019-09-28 | Discharge: 2019-09-29 | DRG: 470 | Disposition: A | Discharge status: Home / Self Care | Payer: Federal, State, Local not specified - PPO | Attending: Orthopedic Surgery | Admitting: Orthopedic Surgery

## 2019-09-28 ENCOUNTER — Inpatient Hospital Stay (HOSPITAL_COMMUNITY): Payer: Federal, State, Local not specified - PPO | Admitting: Certified Registered"

## 2019-09-28 ENCOUNTER — Encounter (HOSPITAL_COMMUNITY)
Admission: RE | Disposition: A | Discharge status: Home / Self Care | Payer: Self-pay | Source: Home / Self Care | Attending: Orthopedic Surgery

## 2019-09-28 ENCOUNTER — Other Ambulatory Visit: Payer: Self-pay

## 2019-09-28 DIAGNOSIS — Z888 Allergy status to other drugs, medicaments and biological substances status: Secondary | ICD-10-CM

## 2019-09-28 DIAGNOSIS — M179 Osteoarthritis of knee, unspecified: Secondary | ICD-10-CM | POA: Diagnosis present

## 2019-09-28 DIAGNOSIS — Z79890 Hormone replacement therapy (postmenopausal): Secondary | ICD-10-CM

## 2019-09-28 DIAGNOSIS — G8918 Other acute postprocedural pain: Secondary | ICD-10-CM | POA: Diagnosis not present

## 2019-09-28 DIAGNOSIS — E039 Hypothyroidism, unspecified: Secondary | ICD-10-CM | POA: Diagnosis not present

## 2019-09-28 DIAGNOSIS — Z96652 Presence of left artificial knee joint: Secondary | ICD-10-CM | POA: Diagnosis not present

## 2019-09-28 DIAGNOSIS — M25762 Osteophyte, left knee: Secondary | ICD-10-CM | POA: Diagnosis not present

## 2019-09-28 DIAGNOSIS — Z6834 Body mass index (BMI) 34.0-34.9, adult: Secondary | ICD-10-CM | POA: Diagnosis not present

## 2019-09-28 DIAGNOSIS — Z90710 Acquired absence of both cervix and uterus: Secondary | ICD-10-CM

## 2019-09-28 DIAGNOSIS — M069 Rheumatoid arthritis, unspecified: Secondary | ICD-10-CM | POA: Diagnosis not present

## 2019-09-28 DIAGNOSIS — J45909 Unspecified asthma, uncomplicated: Secondary | ICD-10-CM | POA: Diagnosis not present

## 2019-09-28 DIAGNOSIS — E669 Obesity, unspecified: Secondary | ICD-10-CM | POA: Diagnosis not present

## 2019-09-28 DIAGNOSIS — Z20828 Contact with and (suspected) exposure to other viral communicable diseases: Secondary | ICD-10-CM | POA: Diagnosis present

## 2019-09-28 DIAGNOSIS — Z881 Allergy status to other antibiotic agents status: Secondary | ICD-10-CM | POA: Diagnosis not present

## 2019-09-28 DIAGNOSIS — Z79899 Other long term (current) drug therapy: Secondary | ICD-10-CM | POA: Diagnosis not present

## 2019-09-28 DIAGNOSIS — M1712 Unilateral primary osteoarthritis, left knee: Principal | ICD-10-CM | POA: Diagnosis present

## 2019-09-28 DIAGNOSIS — Z885 Allergy status to narcotic agent status: Secondary | ICD-10-CM | POA: Diagnosis not present

## 2019-09-28 DIAGNOSIS — Z8249 Family history of ischemic heart disease and other diseases of the circulatory system: Secondary | ICD-10-CM | POA: Diagnosis not present

## 2019-09-28 DIAGNOSIS — Z833 Family history of diabetes mellitus: Secondary | ICD-10-CM | POA: Diagnosis not present

## 2019-09-28 DIAGNOSIS — F419 Anxiety disorder, unspecified: Secondary | ICD-10-CM | POA: Diagnosis present

## 2019-09-28 DIAGNOSIS — M1710 Unilateral primary osteoarthritis, unspecified knee: Secondary | ICD-10-CM

## 2019-09-28 DIAGNOSIS — K219 Gastro-esophageal reflux disease without esophagitis: Secondary | ICD-10-CM | POA: Diagnosis not present

## 2019-09-28 DIAGNOSIS — E785 Hyperlipidemia, unspecified: Secondary | ICD-10-CM | POA: Diagnosis present

## 2019-09-28 DIAGNOSIS — J302 Other seasonal allergic rhinitis: Secondary | ICD-10-CM | POA: Diagnosis present

## 2019-09-28 HISTORY — PX: TOTAL KNEE ARTHROPLASTY: SHX125

## 2019-09-28 LAB — TYPE AND SCREEN
ABO/RH(D): A POS
Antibody Screen: NEGATIVE

## 2019-09-28 SURGERY — ARTHROPLASTY, KNEE, TOTAL
Anesthesia: General | Site: Knee | Laterality: Left

## 2019-09-28 MED ORDER — ONDANSETRON HCL 4 MG/2ML IJ SOLN: 4.0000 mg | Freq: Four times a day (QID) | INTRAMUSCULAR | Status: DC | PRN

## 2019-09-28 MED ORDER — DEXAMETHASONE SODIUM PHOSPHATE 10 MG/ML IJ SOLN
INTRAMUSCULAR | Status: AC
  Filled 2019-09-28: qty 1

## 2019-09-28 MED ORDER — FENTANYL CITRATE (PF) 100 MCG/2ML IJ SOLN
25.0000 ug | INTRAMUSCULAR | Status: DC | PRN
  Administered 2019-09-28: 50 ug via INTRAVENOUS

## 2019-09-28 MED ORDER — FENTANYL CITRATE (PF) 100 MCG/2ML IJ SOLN
50.0000 ug | INTRAMUSCULAR | Status: DC
  Administered 2019-09-28: 50 ug via INTRAVENOUS

## 2019-09-28 MED ORDER — FENTANYL CITRATE (PF) 100 MCG/2ML IJ SOLN
INTRAMUSCULAR | Status: AC
  Filled 2019-09-28: qty 2

## 2019-09-28 MED ORDER — LORATADINE 10 MG PO TABS
10.0000 mg | ORAL_TABLET | Freq: Every day | ORAL | Status: DC
  Administered 2019-09-28: 10 mg via ORAL
  Filled 2019-09-28: qty 1

## 2019-09-28 MED ORDER — ASPIRIN EC 325 MG PO TBEC
325.0000 mg | DELAYED_RELEASE_TABLET | Freq: Two times a day (BID) | ORAL | Status: DC
  Administered 2019-09-29: 325 mg via ORAL
  Filled 2019-09-28: qty 1

## 2019-09-28 MED ORDER — FLEET ENEMA 7-19 GM/118ML RE ENEM: 1.0000 | ENEMA | Freq: Once | RECTAL | Status: DC | PRN

## 2019-09-28 MED ORDER — MORPHINE SULFATE (PF) 4 MG/ML IV SOLN: 1.0000 mg | INTRAVENOUS | Status: DC | PRN

## 2019-09-28 MED ORDER — SODIUM CHLORIDE (PF) 0.9 % IJ SOLN
INTRAMUSCULAR | Status: AC
  Filled 2019-09-28: qty 10

## 2019-09-28 MED ORDER — SODIUM CHLORIDE (PF) 0.9 % IJ SOLN
INTRAMUSCULAR | Status: AC
  Filled 2019-09-28: qty 50

## 2019-09-28 MED ORDER — BUPIVACAINE LIPOSOME 1.3 % IJ SUSP
INTRAMUSCULAR | Status: DC | PRN
  Administered 2019-09-28: 20 mL

## 2019-09-28 MED ORDER — PROPOFOL 10 MG/ML IV BOLUS
INTRAVENOUS | Status: DC | PRN
  Administered 2019-09-28: 200 mg via INTRAVENOUS

## 2019-09-28 MED ORDER — FENTANYL CITRATE (PF) 250 MCG/5ML IJ SOLN
INTRAMUSCULAR | Status: AC
  Filled 2019-09-28: qty 5

## 2019-09-28 MED ORDER — LACTATED RINGERS IV SOLN
INTRAVENOUS | Status: DC
  Administered 2019-09-28: 12:00:00 via INTRAVENOUS

## 2019-09-28 MED ORDER — BISACODYL 10 MG RE SUPP: 10.0000 mg | Freq: Every day | RECTAL | Status: DC | PRN

## 2019-09-28 MED ORDER — MIDAZOLAM HCL 2 MG/2ML IJ SOLN
2.0000 mg | INTRAMUSCULAR | Status: DC | PRN
  Administered 2019-09-28: 1 mg via INTRAVENOUS

## 2019-09-28 MED ORDER — TAPENTADOL HCL 50 MG PO TABS
50.0000 mg | ORAL_TABLET | ORAL | Status: DC | PRN
  Administered 2019-09-28 (×2): 50 mg via ORAL
  Filled 2019-09-28 (×5): qty 1

## 2019-09-28 MED ORDER — LEVOTHYROXINE SODIUM 50 MCG PO TABS
50.0000 ug | ORAL_TABLET | Freq: Every day | ORAL | Status: DC
  Administered 2019-09-29: 50 ug via ORAL
  Filled 2019-09-28: qty 1

## 2019-09-28 MED ORDER — METOCLOPRAMIDE HCL 5 MG PO TABS: 5.0000 mg | ORAL_TABLET | Freq: Three times a day (TID) | ORAL | Status: DC | PRN

## 2019-09-28 MED ORDER — DIPHENHYDRAMINE HCL 12.5 MG/5ML PO ELIX
12.5000 mg | ORAL_SOLUTION | ORAL | Status: DC | PRN
  Administered 2019-09-28 – 2019-09-29 (×3): 25 mg via ORAL
  Filled 2019-09-28 (×3): qty 10

## 2019-09-28 MED ORDER — MENTHOL 3 MG MT LOZG: 1.0000 | LOZENGE | OROMUCOSAL | Status: DC | PRN

## 2019-09-28 MED ORDER — SODIUM CHLORIDE 0.9 % IV SOLN: INTRAVENOUS | Status: DC

## 2019-09-28 MED ORDER — FENTANYL CITRATE (PF) 100 MCG/2ML IJ SOLN
INTRAMUSCULAR | Status: DC | PRN
  Administered 2019-09-28 (×2): 50 ug via INTRAVENOUS

## 2019-09-28 MED ORDER — ONDANSETRON HCL 4 MG/2ML IJ SOLN
INTRAMUSCULAR | Status: AC
  Filled 2019-09-28: qty 2

## 2019-09-28 MED ORDER — METHOCARBAMOL 500 MG IVPB - SIMPLE MED
500.0000 mg | Freq: Four times a day (QID) | INTRAVENOUS | Status: DC | PRN
  Administered 2019-09-28: 500 mg via INTRAVENOUS
  Filled 2019-09-28: qty 50

## 2019-09-28 MED ORDER — MIDAZOLAM HCL 2 MG/2ML IJ SOLN
1.0000 mg | INTRAMUSCULAR | Status: DC
  Administered 2019-09-28: 1 mg via INTRAVENOUS

## 2019-09-28 MED ORDER — ROPIVACAINE HCL 5 MG/ML IJ SOLN
INTRAMUSCULAR | Status: DC | PRN
  Administered 2019-09-28: 25 mL via PERINEURAL

## 2019-09-28 MED ORDER — STERILE WATER FOR IRRIGATION IR SOLN
Status: DC | PRN
  Administered 2019-09-28 (×2): 1000 mL

## 2019-09-28 MED ORDER — METHOCARBAMOL 500 MG PO TABS
500.0000 mg | ORAL_TABLET | Freq: Four times a day (QID) | ORAL | Status: DC | PRN
  Administered 2019-09-28 – 2019-09-29 (×3): 500 mg via ORAL
  Filled 2019-09-28 (×3): qty 1

## 2019-09-28 MED ORDER — TAPENTADOL HCL 50 MG PO TABS
100.0000 mg | ORAL_TABLET | ORAL | Status: DC | PRN
  Administered 2019-09-28 – 2019-09-29 (×2): 100 mg via ORAL
  Filled 2019-09-28: qty 2

## 2019-09-28 MED ORDER — MIDAZOLAM HCL 2 MG/2ML IJ SOLN
INTRAMUSCULAR | Status: AC
  Administered 2019-09-28: 1 mg via INTRAVENOUS
  Filled 2019-09-28: qty 2

## 2019-09-28 MED ORDER — PANTOPRAZOLE SODIUM 40 MG PO TBEC
40.0000 mg | DELAYED_RELEASE_TABLET | Freq: Two times a day (BID) | ORAL | Status: DC
  Administered 2019-09-28 – 2019-09-29 (×2): 40 mg via ORAL
  Filled 2019-09-28 (×2): qty 1

## 2019-09-28 MED ORDER — TRANEXAMIC ACID-NACL 1000-0.7 MG/100ML-% IV SOLN
INTRAVENOUS | Status: AC
  Filled 2019-09-28: qty 100

## 2019-09-28 MED ORDER — LIDOCAINE 2% (20 MG/ML) 5 ML SYRINGE
INTRAMUSCULAR | Status: DC | PRN
  Administered 2019-09-28: 50 mg via INTRAVENOUS

## 2019-09-28 MED ORDER — FENTANYL CITRATE (PF) 100 MCG/2ML IJ SOLN
25.0000 ug | INTRAMUSCULAR | Status: DC | PRN
  Administered 2019-09-28 (×3): 50 ug via INTRAVENOUS

## 2019-09-28 MED ORDER — MIDAZOLAM HCL 2 MG/2ML IJ SOLN
INTRAMUSCULAR | Status: AC
  Filled 2019-09-28: qty 2

## 2019-09-28 MED ORDER — MONTELUKAST SODIUM 10 MG PO TABS
10.0000 mg | ORAL_TABLET | Freq: Every day | ORAL | Status: DC
  Administered 2019-09-28: 10 mg via ORAL
  Filled 2019-09-28: qty 1

## 2019-09-28 MED ORDER — SODIUM CHLORIDE (PF) 0.9 % IJ SOLN
INTRAMUSCULAR | Status: DC | PRN
  Administered 2019-09-28: 60 mL

## 2019-09-28 MED ORDER — CEFAZOLIN SODIUM-DEXTROSE 2-4 GM/100ML-% IV SOLN
INTRAVENOUS | Status: AC
  Filled 2019-09-28: qty 100

## 2019-09-28 MED ORDER — CHLORHEXIDINE GLUCONATE 4 % EX LIQD: 60.0000 mL | Freq: Once | CUTANEOUS | Status: DC

## 2019-09-28 MED ORDER — DEXAMETHASONE SODIUM PHOSPHATE 10 MG/ML IJ SOLN
8.0000 mg | Freq: Once | INTRAMUSCULAR | Status: AC
  Administered 2019-09-28: 15:00:00 8 mg via INTRAVENOUS

## 2019-09-28 MED ORDER — CEFAZOLIN SODIUM-DEXTROSE 2-4 GM/100ML-% IV SOLN
2.0000 g | Freq: Four times a day (QID) | INTRAVENOUS | Status: AC
  Administered 2019-09-28 – 2019-09-29 (×2): 2 g via INTRAVENOUS
  Filled 2019-09-28 (×2): qty 100

## 2019-09-28 MED ORDER — ACETAMINOPHEN 500 MG PO TABS
1000.0000 mg | ORAL_TABLET | Freq: Four times a day (QID) | ORAL | Status: DC
  Administered 2019-09-28 – 2019-09-29 (×3): 1000 mg via ORAL
  Filled 2019-09-28 (×3): qty 2

## 2019-09-28 MED ORDER — GABAPENTIN 300 MG PO CAPS
300.0000 mg | ORAL_CAPSULE | Freq: Three times a day (TID) | ORAL | Status: DC
  Administered 2019-09-28 – 2019-09-29 (×2): 300 mg via ORAL
  Filled 2019-09-28 (×2): qty 1

## 2019-09-28 MED ORDER — TRANEXAMIC ACID-NACL 1000-0.7 MG/100ML-% IV SOLN
1000.0000 mg | INTRAVENOUS | Status: AC
  Administered 2019-09-28: 15:00:00 1000 mg via INTRAVENOUS

## 2019-09-28 MED ORDER — SODIUM CHLORIDE 0.9 % IR SOLN
Status: DC | PRN
  Administered 2019-09-28: 1000 mL

## 2019-09-28 MED ORDER — METHOCARBAMOL 500 MG IVPB - SIMPLE MED
INTRAVENOUS | Status: AC
  Filled 2019-09-28: qty 50

## 2019-09-28 MED ORDER — METOCLOPRAMIDE HCL 5 MG/ML IJ SOLN: 5.0000 mg | Freq: Three times a day (TID) | INTRAMUSCULAR | Status: DC | PRN

## 2019-09-28 MED ORDER — DEXAMETHASONE SODIUM PHOSPHATE 10 MG/ML IJ SOLN
10.0000 mg | Freq: Once | INTRAMUSCULAR | Status: AC
  Administered 2019-09-29: 10 mg via INTRAVENOUS
  Filled 2019-09-28: qty 1

## 2019-09-28 MED ORDER — ONDANSETRON HCL 4 MG PO TABS
4.0000 mg | ORAL_TABLET | Freq: Four times a day (QID) | ORAL | Status: DC | PRN
  Administered 2019-09-29: 4 mg via ORAL
  Filled 2019-09-28: qty 1

## 2019-09-28 MED ORDER — ACETAMINOPHEN 10 MG/ML IV SOLN
INTRAVENOUS | Status: AC
  Filled 2019-09-28: qty 100

## 2019-09-28 MED ORDER — PHENOL 1.4 % MT LIQD: 1.0000 | OROMUCOSAL | Status: DC | PRN

## 2019-09-28 MED ORDER — ONDANSETRON HCL 4 MG/2ML IJ SOLN
INTRAMUSCULAR | Status: DC | PRN
  Administered 2019-09-28: 4 mg via INTRAVENOUS

## 2019-09-28 MED ORDER — CEFAZOLIN SODIUM-DEXTROSE 2-4 GM/100ML-% IV SOLN
2.0000 g | INTRAVENOUS | Status: AC
  Administered 2019-09-28: 15:00:00 2 g via INTRAVENOUS

## 2019-09-28 MED ORDER — POVIDONE-IODINE 10 % EX SWAB
2.0000 "application " | Freq: Once | CUTANEOUS | Status: AC
  Administered 2019-09-28: 2 via TOPICAL

## 2019-09-28 MED ORDER — DOCUSATE SODIUM 100 MG PO CAPS
100.0000 mg | ORAL_CAPSULE | Freq: Two times a day (BID) | ORAL | Status: DC
  Administered 2019-09-29: 100 mg via ORAL
  Filled 2019-09-28 (×2): qty 1

## 2019-09-28 MED ORDER — FENTANYL CITRATE (PF) 100 MCG/2ML IJ SOLN
INTRAMUSCULAR | Status: AC
  Administered 2019-09-28: 50 ug via INTRAVENOUS
  Filled 2019-09-28: qty 2

## 2019-09-28 MED ORDER — FORTEO 600 MCG/2.4ML ~~LOC~~ SOLN: 20.0000 ug | Freq: Every day | SUBCUTANEOUS | Status: DC

## 2019-09-28 MED ORDER — PROPOFOL 10 MG/ML IV BOLUS
INTRAVENOUS | Status: AC
  Filled 2019-09-28: qty 20

## 2019-09-28 MED ORDER — POLYETHYLENE GLYCOL 3350 17 G PO PACK: 17.0000 g | PACK | Freq: Every day | ORAL | Status: DC | PRN

## 2019-09-28 MED ORDER — ACETAMINOPHEN 10 MG/ML IV SOLN
1000.0000 mg | Freq: Four times a day (QID) | INTRAVENOUS | Status: DC
  Administered 2019-09-28: 15:00:00 1000 mg via INTRAVENOUS

## 2019-09-28 SURGICAL SUPPLY — 57 items
ATTUNE MED DOME PAT 38 KNEE (Knees) ×2 IMPLANT
ATTUNE PS FEM LT SZ 4 CEM KNEE (Femur) ×2 IMPLANT
ATTUNE PSRP INSR SZ4 7 KNEE (Insert) ×2 IMPLANT
BAG ZIPLOCK 12X15 (MISCELLANEOUS) ×2 IMPLANT
BASEPLATE TIBIAL ROTATING SZ 4 (Knees) ×2 IMPLANT
BLADE SAG 18X100X1.27 (BLADE) ×2 IMPLANT
BLADE SAW SGTL 11.0X1.19X90.0M (BLADE) ×2 IMPLANT
BLADE SURG SZ10 CARB STEEL (BLADE) ×4 IMPLANT
BNDG ELASTIC 6X5.8 VLCR STR LF (GAUZE/BANDAGES/DRESSINGS) ×2 IMPLANT
BOWL SMART MIX CTS (DISPOSABLE) ×2 IMPLANT
CEMENT HV SMART SET (Cement) ×4 IMPLANT
COVER SURGICAL LIGHT HANDLE (MISCELLANEOUS) ×2 IMPLANT
COVER WAND RF STERILE (DRAPES) IMPLANT
CUFF TOURN SGL QUICK 34 (TOURNIQUET CUFF) ×1
CUFF TRNQT CYL 34X4.125X (TOURNIQUET CUFF) ×1 IMPLANT
DECANTER SPIKE VIAL GLASS SM (MISCELLANEOUS) ×2 IMPLANT
DRAPE U-SHAPE 47X51 STRL (DRAPES) ×2 IMPLANT
DRSG ADAPTIC 3X8 NADH LF (GAUZE/BANDAGES/DRESSINGS) ×2 IMPLANT
DRSG PAD ABDOMINAL 8X10 ST (GAUZE/BANDAGES/DRESSINGS) ×2 IMPLANT
DURAPREP 26ML APPLICATOR (WOUND CARE) ×2 IMPLANT
ELECT REM PT RETURN 15FT ADLT (MISCELLANEOUS) ×2 IMPLANT
EVACUATOR 1/8 PVC DRAIN (DRAIN) ×2 IMPLANT
GAUZE SPONGE 4X4 12PLY STRL (GAUZE/BANDAGES/DRESSINGS) ×2 IMPLANT
GLOVE BIO SURGEON STRL SZ7 (GLOVE) ×2 IMPLANT
GLOVE BIO SURGEON STRL SZ8 (GLOVE) ×2 IMPLANT
GLOVE BIOGEL PI IND STRL 6.5 (GLOVE) ×1 IMPLANT
GLOVE BIOGEL PI IND STRL 7.0 (GLOVE) ×1 IMPLANT
GLOVE BIOGEL PI IND STRL 8 (GLOVE) ×1 IMPLANT
GLOVE BIOGEL PI INDICATOR 6.5 (GLOVE) ×1
GLOVE BIOGEL PI INDICATOR 7.0 (GLOVE) ×1
GLOVE BIOGEL PI INDICATOR 8 (GLOVE) ×1
GLOVE SURG SS PI 6.5 STRL IVOR (GLOVE) ×2 IMPLANT
GOWN STRL REUS W/TWL LRG LVL3 (GOWN DISPOSABLE) ×6 IMPLANT
HANDPIECE INTERPULSE COAX TIP (DISPOSABLE) ×1
HOLDER FOLEY CATH W/STRAP (MISCELLANEOUS) IMPLANT
IMMOBILIZER KNEE 20 (SOFTGOODS) ×2
IMMOBILIZER KNEE 20 THIGH 36 (SOFTGOODS) ×1 IMPLANT
KIT TURNOVER KIT A (KITS) IMPLANT
MANIFOLD NEPTUNE II (INSTRUMENTS) ×2 IMPLANT
NS IRRIG 1000ML POUR BTL (IV SOLUTION) ×2 IMPLANT
PACK TOTAL KNEE CUSTOM (KITS) ×2 IMPLANT
PADDING CAST COTTON 6X4 STRL (CAST SUPPLIES) ×2 IMPLANT
PENCIL SMOKE EVACUATOR (MISCELLANEOUS) IMPLANT
PIN DRILL FIX HALF THREAD (BIT) ×2 IMPLANT
PIN STEINMAN FIXATION KNEE (PIN) ×2 IMPLANT
PROTECTOR NERVE ULNAR (MISCELLANEOUS) ×2 IMPLANT
SET HNDPC FAN SPRY TIP SCT (DISPOSABLE) ×1 IMPLANT
STRIP CLOSURE SKIN 1/2X4 (GAUZE/BANDAGES/DRESSINGS) ×4 IMPLANT
SUT MNCRL AB 4-0 PS2 18 (SUTURE) ×2 IMPLANT
SUT STRATAFIX 0 PDS 27 VIOLET (SUTURE) ×2
SUT VIC AB 2-0 CT1 27 (SUTURE) ×3
SUT VIC AB 2-0 CT1 TAPERPNT 27 (SUTURE) ×3 IMPLANT
SUTURE STRATFX 0 PDS 27 VIOLET (SUTURE) ×1 IMPLANT
TRAY FOLEY MTR SLVR 16FR STAT (SET/KITS/TRAYS/PACK) ×2 IMPLANT
WATER STERILE IRR 1000ML POUR (IV SOLUTION) ×4 IMPLANT
WRAP KNEE MAXI GEL POST OP (GAUZE/BANDAGES/DRESSINGS) ×2 IMPLANT
YANKAUER SUCT BULB TIP 10FT TU (MISCELLANEOUS) ×2 IMPLANT

## 2019-09-28 NOTE — Op Note (Signed)
OPERATIVE REPORT-TOTAL KNEE ARTHROPLASTY   Pre-operative diagnosis- Osteoarthritis  Left knee(s)  Post-operative diagnosis- Osteoarthritis Left knee(s)  Procedure-  Left  Total Knee Arthroplasty  Surgeon- Gus Rankin. Leona Alen, MD  Assistant- Arther Abbott, PA-C   Anesthesia-  GA combined with regional for post-op pain  EBL-50 mL   Drains Hemovac  Tourniquet time-  Total Tourniquet Time Documented: Thigh (Left) - 39 minutes Total: Thigh (Left) - 39 minutes     Complications- None  Condition-PACU - hemodynamically stable.   Brief Clinical Note  Jean Hernandez is a 48 y.o. year old female with end stage OA of her left knee with progressively worsening pain and dysfunction. She has constant pain, with activity and at rest and significant functional deficits with difficulties even with ADLs. She has had extensive non-op management including analgesics, injections of cortisone and viscosupplements, and home exercise program, but remains in significant pain with significant dysfunction. Radiographs show bone on bone arthritis medial and patellofemoral. She presents now for left Total Knee Arthroplasty.    Procedure in detail---   The patient is brought into the operating room and positioned supine on the operating table. After successful administration of  GA combined with regional for post-op pain,   a tourniquet is placed high on the  Left thigh(s) and the lower extremity is prepped and draped in the usual sterile fashion. Time out is performed by the operating team and then the  Left lower extremity is wrapped in Esmarch, knee flexed and the tourniquet inflated to 300 mmHg.       A midline incision is made with a ten blade through the subcutaneous tissue to the level of the extensor mechanism. A fresh blade is used to make a medial parapatellar arthrotomy. Soft tissue over the proximal medial tibia is subperiosteally elevated to the joint line with a knife and into the semimembranosus  bursa with a Cobb elevator. Soft tissue over the proximal lateral tibia is elevated with attention being paid to avoiding the patellar tendon on the tibial tubercle. The patella is everted, knee flexed 90 degrees and the ACL and PCL are removed. Findings are bone on bone medial and patellofemoral with large global osteophytes.        The drill is used to create a starting hole in the distal femur and the canal is thoroughly irrigated with sterile saline to remove the fatty contents. The 5 degree Left  valgus alignment guide is placed into the femoral canal and the distal femoral cutting block is pinned to remove 9 mm off the distal femur. Resection is made with an oscillating saw.      The tibia is subluxed forward and the menisci are removed. The extramedullary alignment guide is placed referencing proximally at the medial aspect of the tibial tubercle and distally along the second metatarsal axis and tibial crest. The block is pinned to remove 98mm off the more deficient medial  side. Resection is made with an oscillating saw. Size 4is the most appropriate size for the tibia and the proximal tibia is prepared with the modular drill and keel punch for that size.      The femoral sizing guide is placed and size 4 is most appropriate. Rotation is marked off the epicondylar axis and confirmed by creating a rectangular flexion gap at 90 degrees. The size 4 cutting block is pinned in this rotation and the anterior, posterior and chamfer cuts are made with the oscillating saw. The intercondylar block is then placed and that  cut is made.      Trial size 4 tibial component, trial size 4 posterior stabilized femur and a 7  mm posterior stabilized rotating platform insert trial is placed. Full extension is achieved with excellent varus/valgus and anterior/posterior balance throughout full range of motion. The patella is everted and thickness measured to be 22  mm. Free hand resection is taken to 12 mm, a 38 template is  placed, lug holes are drilled, trial patella is placed, and it tracks normally. Osteophytes are removed off the posterior femur with the trial in place. All trials are removed and the cut bone surfaces prepared with pulsatile lavage. Cement is mixed and once ready for implantation, the size 4 tibial implant, size  4 posterior stabilized femoral component, and the size 38 patella are cemented in place and the patella is held with the clamp. The trial insert is placed and the knee held in full extension. The Exparel (20 ml mixed with 60 ml saline) is injected into the extensor mechanism, posterior capsule, medial and lateral gutters and subcutaneous tissues.  All extruded cement is removed and once the cement is hard the permanent 7 mm posterior stabilized rotating platform insert is placed into the tibial tray.      The wound is copiously irrigated with saline solution and the extensor mechanism closed over a hemovac drain with #1 V-loc suture. The tourniquet is released for a total tourniquet time of 39  minutes. Flexion against gravity is 140 degrees and the patella tracks normally. Subcutaneous tissue is closed with 2.0 vicryl and subcuticular with running 4.0 Monocryl. The incision is cleaned and dried and steri-strips and a bulky sterile dressing are applied. The limb is placed into a knee immobilizer and the patient is awakened and transported to recovery in stable condition.      Please note that a surgical assistant was a medical necessity for this procedure in order to perform it in a safe and expeditious manner. Surgical assistant was necessary to retract the ligaments and vital neurovascular structures to prevent injury to them and also necessary for proper positioning of the limb to allow for anatomic placement of the prosthesis.   Jean Plover Shaneese Tait, MD    09/28/2019, 3:37 PM

## 2019-09-28 NOTE — Progress Notes (Signed)
Assisted Dr. Hodierne with left, ultrasound guided, adductor canal block. Side rails up, monitors on throughout procedure. See vital signs in flow sheet. Tolerated Procedure well.  

## 2019-09-28 NOTE — Interval H&P Note (Signed)
History and Physical Interval Note:  09/28/2019 1:24 PM  Jean Hernandez  has presented today for surgery, with the diagnosis of left knee osteoarthritis.  The various methods of treatment have been discussed with the patient and family. After consideration of risks, benefits and other options for treatment, the patient has consented to  Procedure(s) with comments: TOTAL KNEE ARTHROPLASTY (Left) - 33min as a surgical intervention.  The patient's history has been reviewed, patient examined, no change in status, stable for surgery.  I have reviewed the patient's chart and labs.  Questions were answered to the patient's satisfaction.     Pilar Plate Dillard Pascal

## 2019-09-28 NOTE — Discharge Instructions (Signed)
° °Dr. Frank Aluisio °Total Joint Specialist °[facility] °[address] °[place], [place] [address] °[phone] ° °TOTAL KNEE REPLACEMENT POSTOPERATIVE DIRECTIONS ° °Knee Rehabilitation, Guidelines Following Surgery  °Results after knee surgery are often greatly improved when you follow the exercise, range of motion and muscle strengthening exercises prescribed by your doctor. Safety measures are also important to protect the knee from further injury. Any time any of these exercises cause you to have increased pain or swelling in your knee joint, decrease the amount until you are comfortable again and slowly increase them. If you have problems or questions, call your caregiver or physical therapist for advice.  ° °HOME CARE INSTRUCTIONS  °• Remove items at home which could result in a fall. This includes throw rugs or furniture in walking pathways.  °· ICE to the affected knee every three hours for 30 minutes at a time and then as needed for pain and swelling.  Continue to use ice on the knee for pain and swelling from surgery. You may notice swelling that will progress down to the foot and ankle.  This is normal after surgery.  Elevate the leg when you are not up walking on it.   °· Continue to use the breathing machine which will help keep your temperature down.  It is common for your temperature to cycle up and down following surgery, especially at night when you are not up moving around and exerting yourself.  The breathing machine keeps your lungs expanded and your temperature down. °· Do not place pillow under knee, focus on keeping the knee straight while resting ° °DIET °You may resume your previous home diet once your are discharged from the hospital. ° °DRESSING / WOUND CARE / SHOWERING °You may change your dressing 3-5 days after surgery.  Then change the dressing every day with sterile gauze.  Please use good hand washing techniques before changing the dressing.  Do not use any lotions  or creams on the incision until instructed by your surgeon. °You may start showering once you are discharged home but do not submerge the incision under water. Just pat the incision dry and apply a dry gauze dressing on daily. °Change the surgical dressing daily and reapply a dry dressing each time. ° °ACTIVITY °Walk with your walker as instructed. °Use walker as long as suggested by your caregivers. °Avoid periods of inactivity such as sitting longer than an hour when not asleep. This helps prevent blood clots.  °You may resume a sexual relationship in one month or when given the OK by your doctor.  °You may return to work once you are cleared by your doctor.  °Do not drive a car for 6 weeks or until released by you surgeon.  °Do not drive while taking narcotics. ° °WEIGHT BEARING °Weight bearing as tolerated with assist device (walker, cane, etc) as directed, use it as long as suggested by your surgeon or therapist, typically at least 4-6 weeks. ° °POSTOPERATIVE CONSTIPATION PROTOCOL °Constipation - defined medically as fewer than three stools per week and severe constipation as less than one stool per week. ° °One of the most common issues patients have following surgery is constipation.  Even if you have a regular bowel pattern at home, your normal regimen is likely to be disrupted due to multiple reasons following surgery.  Combination of anesthesia, postoperative narcotics, change in appetite and fluid intake all can affect your bowels.  In order to avoid complications following surgery, here are some   recommendations in order to help you during your recovery period. ° °Colace (docusate) - Pick up an over-the-counter form of Colace or another stool softener and take twice a day as long as you are requiring postoperative pain medications.  Take with a full glass of water daily.  If you experience loose stools or diarrhea, hold the colace until you stool forms back up.  If your symptoms do not get better within 1  week or if they get worse, check with your doctor. ° °Dulcolax (bisacodyl) - Pick up over-the-counter and take as directed by the product packaging as needed to assist with the movement of your bowels.  Take with a full glass of water.  Use this product as needed if not relieved by Colace only.  ° °MiraLax (polyethylene glycol) - Pick up over-the-counter to have on hand.  MiraLax is a solution that will increase the amount of water in your bowels to assist with bowel movements.  Take as directed and can mix with a glass of water, juice, soda, coffee, or tea.  Take if you go more than two days without a movement. °Do not use MiraLax more than once per day. Call your doctor if you are still constipated or irregular after using this medication for 7 days in a row. ° °If you continue to have problems with postoperative constipation, please contact the office for further assistance and recommendations.  If you experience "the worst abdominal pain ever" or develop nausea or vomiting, please contact the office immediatly for further recommendations for treatment. ° °ITCHING °If you experience itching with your medications, try taking only a single pain pill, or even half a pain pill at a time.  You can also use Benadryl over the counter for itching or also to help with sleep.  ° °TED HOSE STOCKINGS °Wear the elastic stockings on both legs for three weeks following surgery during the day but you may remove then at night for sleeping. ° °MEDICATIONS °See your medication summary on the “After Visit Summary” that the nursing staff will review with you prior to discharge.  You may have some home medications which will be placed on hold until you complete the course of blood thinner medication.  It is important for you to complete the blood thinner medication as prescribed by your surgeon.  Continue your approved medications as instructed at time of discharge. ° °PRECAUTIONS °If you experience chest pain or shortness of breath -  call 911 immediately for transfer to the hospital emergency department.  °If you develop a fever greater that 101 F, purulent drainage from wound, increased redness or drainage from wound, foul odor from the wound/dressing, or calf pain - CONTACT YOUR SURGEON.   °                                                °FOLLOW-UP APPOINTMENTS °Make sure you keep all of your appointments after your operation with your surgeon and caregivers. You should call the office at the above phone number and make an appointment for approximately two weeks after the date of your surgery or on the date instructed by your surgeon outlined in the "After Visit Summary". ° °RANGE OF MOTION AND STRENGTHENING EXERCISES  °Rehabilitation of the knee is important following a knee injury or an operation. After just a few days of immobilization, the muscles of   the thigh which control the knee become weakened and shrink (atrophy). Knee exercises are designed to build up the tone and strength of the thigh muscles and to improve knee motion. Often times heat used for twenty to thirty minutes before working out will loosen up your tissues and help with improving the range of motion but do not use heat for the first two weeks following surgery. These exercises can be done on a training (exercise) mat, on the floor, on a table or on a bed. Use what ever works the best and is most comfortable for you Knee exercises include:  °• Leg Lifts - While your knee is still immobilized in a splint or cast, you can do straight leg raises. Lift the leg to 60 degrees, hold for 3 sec, and slowly lower the leg. Repeat 10-20 times 2-3 times daily. Perform this exercise against resistance later as your knee gets better.  °• Quad and Hamstring Sets - Tighten up the muscle on the front of the thigh (Quad) and hold for 5-10 sec. Repeat this 10-20 times hourly. Hamstring sets are done by pushing the foot backward against an object and holding for 5-10 sec. Repeat as with quad  sets.  °· Leg Slides: Lying on your back, slowly slide your foot toward your buttocks, bending your knee up off the floor (only go as far as is comfortable). Then slowly slide your foot back down until your leg is flat on the floor again. °· Angel Wings: Lying on your back spread your legs to the side as far apart as you can without causing discomfort.  °A rehabilitation program following serious knee injuries can speed recovery and prevent re-injury in the future due to weakened muscles. Contact your doctor or a physical therapist for more information on knee rehabilitation.  ° °IF YOU ARE TRANSFERRED TO A SKILLED REHAB FACILITY °If the patient is transferred to a skilled rehab facility following release from the hospital, a list of the current medications will be sent to the facility for the patient to continue.  When discharged from the skilled rehab facility, please have the facility set up the patient's Home Health Physical Therapy prior to being released. Also, the skilled facility will be responsible for providing the patient with their medications at time of release from the facility to include their pain medication, the muscle relaxants, and their blood thinner medication. If the patient is still at the rehab facility at time of the two week follow up appointment, the skilled rehab facility will also need to assist the patient in arranging follow up appointment in our office and any transportation needs. ° °MAKE SURE YOU:  °• Understand these instructions.  °• Get help right away if you are not doing well or get worse.  ° ° °Pick up stool softner and laxative for home use following surgery while on pain medications. °Do not submerge incision under water. °Please use good hand washing techniques while changing dressing each day. °May shower starting three days after surgery. °Please use a clean towel to pat the incision dry following showers. °Continue to use ice for pain and swelling after surgery. °Do not  use any lotions or creams on the incision until instructed by your surgeon. ° °

## 2019-09-28 NOTE — Anesthesia Procedure Notes (Signed)
Anesthesia Regional Block: Adductor canal block   Pre-Anesthetic Checklist: ,, timeout performed, Correct Patient, Correct Site, Correct Laterality, Correct Procedure, Correct Position, site marked, Risks and benefits discussed,  Surgical consent,  Pre-op evaluation,  At surgeon's request and post-op pain management  Laterality: Left  Prep: chloraprep       Needles:  Injection technique: Single-shot  Needle Type: Echogenic Needle     Needle Length: 9cm  Needle Gauge: 21     Additional Needles:   Narrative:  Start time: 09/28/2019 2:06 PM End time: 09/28/2019 2:11 PM Injection made incrementally with aspirations every 5 mL.  Performed by: Personally  Anesthesiologist: Albertha Ghee, MD  Additional Notes: Pt tolerated the procedure well.

## 2019-09-28 NOTE — Transfer of Care (Signed)
Immediate Anesthesia Transfer of Care Note  Patient: Jean Hernandez  Procedure(s) Performed: TOTAL KNEE ARTHROPLASTY (Left Knee)  Patient Location: PACU  Anesthesia Type:General  Level of Consciousness: awake, alert  and oriented  Airway & Oxygen Therapy: Patient Spontanous Breathing and Patient connected to face mask oxygen  Post-op Assessment: Report given to RN, Post -op Vital signs reviewed and stable and Patient moving all extremities X 4  Post vital signs: Reviewed and stable  Last Vitals:  Vitals Value Taken Time  BP 159/104 09/28/19 1604  Temp    Pulse 108 09/28/19 1605  Resp 17 09/28/19 1605  SpO2 100 % 09/28/19 1605  Vitals shown include unvalidated device data.  Last Pain:  Vitals:   09/28/19 1411  TempSrc:   PainSc: 0-No pain      Patients Stated Pain Goal: 4 (38/18/29 9371)  Complications: No apparent anesthesia complications

## 2019-09-28 NOTE — Anesthesia Procedure Notes (Signed)
Procedure Name: LMA Insertion Date/Time: 09/28/2019 2:25 PM Performed by: Niel Hummer, CRNA Pre-anesthesia Checklist: Patient identified, Emergency Drugs available, Suction available and Patient being monitored Patient Re-evaluated:Patient Re-evaluated prior to induction Oxygen Delivery Method: Circle system utilized Preoxygenation: Pre-oxygenation with 100% oxygen Induction Type: IV induction Ventilation: Mask ventilation without difficulty LMA: LMA inserted LMA Size: 4.0 Dental Injury: Teeth and Oropharynx as per pre-operative assessment

## 2019-09-28 NOTE — Anesthesia Preprocedure Evaluation (Addendum)
Anesthesia Evaluation  Patient identified by MRN, date of birth, ID band Patient awake    Reviewed: Allergy & Precautions, H&P , NPO status , Patient's Chart, lab work & pertinent test results  Airway Mallampati: II   Neck ROM: full    Dental   Pulmonary asthma , sleep apnea ,    breath sounds clear to auscultation       Cardiovascular negative cardio ROS   Rhythm:regular Rate:Normal     Neuro/Psych PSYCHIATRIC DISORDERS Anxiety    GI/Hepatic GERD  ,  Endo/Other  Hypothyroidism   Renal/GU      Musculoskeletal  (+) Arthritis , Rheumatoid disorders,    Abdominal   Peds  Hematology   Anesthesia Other Findings   Reproductive/Obstetrics                             Anesthesia Physical Anesthesia Plan  ASA: II  Anesthesia Plan: General   Post-op Pain Management:  Regional for Post-op pain   Induction: Intravenous  PONV Risk Score and Plan: 3 and Propofol infusion, Ondansetron and Treatment may vary due to age or medical condition  Airway Management Planned: LMA  Additional Equipment:   Intra-op Plan:   Post-operative Plan:   Informed Consent: I have reviewed the patients History and Physical, chart, labs and discussed the procedure including the risks, benefits and alternatives for the proposed anesthesia with the patient or authorized representative who has indicated his/her understanding and acceptance.       Plan Discussed with: CRNA, Anesthesiologist and Surgeon  Anesthesia Plan Comments:        Anesthesia Quick Evaluation

## 2019-09-29 ENCOUNTER — Encounter: Payer: Self-pay | Admitting: *Deleted

## 2019-09-29 LAB — BASIC METABOLIC PANEL
Anion gap: 10 (ref 5–15)
BUN: 10 mg/dL (ref 6–20)
CO2: 23 mmol/L (ref 22–32)
Calcium: 8.9 mg/dL (ref 8.9–10.3)
Chloride: 104 mmol/L (ref 98–111)
Creatinine, Ser: 0.77 mg/dL (ref 0.44–1.00)
GFR calc Af Amer: >60 mL/min (ref >60)
GFR calc non Af Amer: >60 mL/min (ref >60)
Glucose, Bld: 173 mg/dL — ABNORMAL HIGH (ref 70–99)
Potassium: 4.1 mmol/L (ref 3.5–5.1)
Sodium: 137 mmol/L (ref 135–145)

## 2019-09-29 LAB — CBC
HCT: 42.5 % (ref 36.0–46.0)
Hemoglobin: 14.1 g/dL (ref 12.0–15.0)
MCH: 31.2 pg (ref 26.0–34.0)
MCHC: 33.2 g/dL (ref 30.0–36.0)
MCV: 94 fL (ref 80.0–100.0)
Platelets: 215 10*3/uL (ref 150–400)
RBC: 4.52 MIL/uL (ref 3.87–5.11)
RDW: 12.2 % (ref 11.5–15.5)
WBC: 13.3 10*3/uL — ABNORMAL HIGH (ref 4.0–10.5)
nRBC: 0 % (ref 0.0–0.2)

## 2019-09-29 MED ORDER — METHOCARBAMOL 500 MG PO TABS: 500.0000 mg | ORAL_TABLET | Freq: Four times a day (QID) | ORAL | 0 refills | Status: DC | PRN

## 2019-09-29 MED ORDER — TAPENTADOL HCL 50 MG PO TABS: 50.0000 mg | ORAL_TABLET | Freq: Four times a day (QID) | ORAL | 0 refills | Status: DC | PRN

## 2019-09-29 MED ORDER — DIPHENHYDRAMINE HCL 12.5 MG/5ML PO ELIX: 12.5000 mg | ORAL_SOLUTION | Freq: Four times a day (QID) | ORAL | 0 refills | Status: DC | PRN

## 2019-09-29 MED ORDER — TAPENTADOL HCL 50 MG PO TABS
100.0000 mg | ORAL_TABLET | ORAL | Status: DC | PRN
  Administered 2019-09-29 (×2): 150 mg via ORAL
  Filled 2019-09-29: qty 3

## 2019-09-29 MED ORDER — GABAPENTIN 300 MG PO CAPS: 300.0000 mg | ORAL_CAPSULE | Freq: Three times a day (TID) | ORAL | 0 refills | Status: DC

## 2019-09-29 MED ORDER — ASPIRIN 325 MG PO TBEC: 325.0000 mg | DELAYED_RELEASE_TABLET | Freq: Two times a day (BID) | ORAL | 0 refills | Status: AC

## 2019-09-29 NOTE — Evaluation (Signed)
Physical Therapy Evaluation Patient Details Name: Jean Hernandez MRN: [id number] DOB: 1971/01/01 Today's Date: 09/29/2019   History of Present Illness  48 yo female s/p L TKA 12/14  Clinical Impression  On eval, pt was Min guard assist for mobility. She walked ~100 feet with a RW. Moderate pain with activity. Will plan to have a 2nd session prior to d/c home later today.     Follow Up Recommendations Follow surgeon's recommendation for DC plan and follow-up therapies    Equipment Recommendations  None recommended by PT    Recommendations for Other Services       Precautions / Restrictions Precautions Precautions: Fall Restrictions Weight Bearing Restrictions: No LLE Weight Bearing: Weight bearing as tolerated      Mobility  Bed Mobility Overal bed mobility: Needs Assistance Bed Mobility: Supine to Sit     Supine to sit: Supervision        Transfers Overall transfer level: Needs assistance Equipment used: Rolling walker (2 wheeled) Transfers: Sit to/from Stand Sit to Stand: Supervision         General transfer comment: VCs safety, hand placement.  Ambulation/Gait Ambulation/Gait assistance: Min guard Gait Distance (Feet): 100 Feet Assistive device: Rolling walker (2 wheeled)       General Gait Details: VCs safety, technique, sequence.  Stairs            Wheelchair Mobility    Modified Rankin (Stroke Patients Only)       Balance Overall balance assessment: Needs assistance         Standing balance support: Bilateral upper extremity supported Standing balance-Leahy Scale: Poor                               Pertinent Vitals/Pain Pain Assessment: 0-10 Pain Score: 5  Pain Location: L knee Pain Descriptors / Indicators: Aching;Sore Pain Intervention(s): Monitored during session;Ice applied;Repositioned    Home Living                        Prior Function                 Hand Dominance         Extremity/Trunk Assessment   Upper Extremity Assessment Upper Extremity Assessment: Overall WFL for tasks assessed    Lower Extremity Assessment Lower Extremity Assessment: (post op weakness 2* TKA)    Cervical / Trunk Assessment Cervical / Trunk Assessment: Normal  Communication      Cognition Arousal/Alertness: Awake/alert Behavior During Therapy: WFL for tasks assessed/performed Overall Cognitive Status: Within Functional Limits for tasks assessed                                        General Comments      Exercises Total Joint Exercises Ankle Circles/Pumps: AROM;Both;10 reps;Supine Quad Sets: AROM;Both;10 reps;Supine Heel Slides: AAROM;Left;10 reps;Supine Hip ABduction/ADduction: AAROM;Left;10 reps;Supine;AROM Straight Leg Raises: AAROM;Left;10 reps;Supine;AROM Goniometric ROM: ~10-65 degrees   Assessment/Plan    PT Assessment Patient needs continued PT services  PT Problem List Decreased strength;Decreased mobility;Decreased range of motion;Decreased activity tolerance;Decreased balance;Decreased knowledge of use of DME;Pain       PT Treatment Interventions DME instruction;Gait training;Therapeutic activities;Therapeutic exercise;Patient/family education;Functional mobility training;Balance training    PT Goals (Current goals can be found in the Care Plan section)  Acute Rehab PT Goals Patient Stated  Goal: home PT Goal Formulation: With patient Time For Goal Achievement: 10/13/19 Potential to Achieve Goals: Good    Frequency 7X/week   Barriers to discharge        Co-evaluation               AM-PAC PT "6 Clicks" Mobility  Outcome Measure Help needed turning from your back to your side while in a flat bed without using bedrails?: A Little Help needed moving from lying on your back to sitting on the side of a flat bed without using bedrails?: A Little Help needed moving to and from a bed to a chair (including a wheelchair)?: A  Little Help needed standing up from a chair using your arms (e.g., wheelchair or bedside chair)?: A Little Help needed to walk in hospital room?: A Little Help needed climbing 3-5 steps with a railing? : A Little 6 Click Score: 18    End of Session Equipment Utilized During Treatment: Gait belt Activity Tolerance: Patient tolerated treatment well Patient left: in chair;with call bell/phone within reach   PT Visit Diagnosis: Pain;Other abnormalities of gait and mobility (R26.89) Pain - part of body: Knee    Time: 0940-1009 PT Time Calculation (min) (ACUTE ONLY): 29 min   Charges:   PT Evaluation $PT Eval Low Complexity: 1 Low PT Treatments $Gait Training: 8-22 mins         Weston Anna, PT Acute Rehabilitation Services Pager: [phone] Office: [phone]

## 2019-09-29 NOTE — Progress Notes (Signed)
   Subjective: 1 Day Post-Op Procedure(s) (LRB): TOTAL KNEE ARTHROPLASTY (Left) Patient reports pain as mild.   Patient seen in rounds by Dr. Wynelle Link. Patient is well, and has had no acute complaints or problems other than mild itching with medications. This is a common side effect of narcotics for Jean Hernandez. Taking benadryl with relief. Denies chest pain, SOB, or calf pain. Foley catheter to be removed this AM.  We will start therapy today.   Objective: Vital signs in last 24 hours: Temp:  [97.5 F (36.4 C)-98.6 F (37 C)] 97.7 F (36.5 C) (12/15 0613) Pulse Rate:  [86-112] 93 (12/15 0613) Resp:  [10-23] 18 (12/15 0613) BP: (118-159)/(73-111) 122/81 (12/15 0613) SpO2:  [93 %-100 %] 97 % (12/15 0613) Weight:  [96.2 kg] 96.2 kg (12/14 1138)  Intake/Output from previous day:  Intake/Output Summary (Last 24 hours) at 09/29/2019 0725 Last data filed at 09/29/2019 0238 Gross per 24 hour  Intake 1050 ml  Output 1770 ml  Net -720 ml    Labs: Recent Labs    09/29/19 0236  HGB 14.1   Recent Labs    09/29/19 0236  WBC 13.3*  RBC 4.52  HCT 42.5  PLT 215   Recent Labs    09/29/19 0236  NA 137  K 4.1  CL 104  CO2 23  BUN 10  CREATININE 0.77  GLUCOSE 173*  CALCIUM 8.9   Exam: General - Patient is Alert and Oriented Extremity - Neurologically intact Neurovascular intact Sensation intact distally Dorsiflexion/Plantar flexion intact Dressing - dressing C/D/I Motor Function - intact, moving foot and toes well on exam.   Past Medical History:  Diagnosis Date  . Anxiety   . Asthma    Allergy induced  . GERD (gastroesophageal reflux disease)   . Hypothyroidism   . Rheumatoid arthritis ([place])   . Sleep apnea    mild no mask    Assessment/Plan: 1 Day Post-Op Procedure(s) (LRB): TOTAL KNEE ARTHROPLASTY (Left) Principal Problem:   OA (osteoarthritis) of knee  Estimated body mass index is 34.76 kg/m as calculated from the following:   Height as of this  encounter: 5' 5.5" (1.664 m).   Weight as of this encounter: 96.2 kg. Advance diet Up with therapy D/C IV fluids  Anticipated LOS equal to or greater than 2 midnights due to - Age 9 and older with one or more of the following:  - Obesity  - Expected need for hospital services (PT, OT, Nursing) required for safe  discharge  - Anticipated need for postoperative skilled nursing care or inpatient rehab  - Active co-morbidities: None OR   - Unanticipated findings during/Post Surgery: None  - Patient is a high risk of re-admission due to: None    DVT Prophylaxis - Aspirin Weight bearing as tolerated. D/C O2 and pulse ox and try on room air. Hemovac pulled without difficulty, will begin therapy today.  Plan is to go Home after hospital stay. Possible discharge this afternoon if progresses with therapy and is meeting her goals. Scheduled for outpatient physical therapy at [facility] in [place]. Follow-up in the office in 2 days.   Theresa Duty, PA-C Orthopedic Surgery 09/29/2019, 7:25 AM

## 2019-09-29 NOTE — Progress Notes (Signed)
Physical Therapy Treatment Patient Details Name: Jean Hernandez MRN: [id number] DOB: [birth year] Today's Date: 09/29/2019    History of Present Illness 48 yo female s/p L TKA 12/14    PT Comments    Progressing well with mobility. Reviewed gait training and stair training. Husband present to observe. Issued HEP for pt to perform 2x/day until she begins OP PT. All education completed. Okay to d/c from PT standpoint.    Follow Up Recommendations  Follow surgeon's recommendation for DC plan and follow-up therapies     Equipment Recommendations  None recommended by PT    Recommendations for Other Services       Precautions / Restrictions Precautions Precautions: Fall Restrictions Weight Bearing Restrictions: No LLE Weight Bearing: Weight bearing as tolerated    Mobility  Bed Mobility Overal bed mobility: Needs Assistance Bed Mobility: Supine to Sit     Supine to sit: Supervision        Transfers Overall transfer level: Needs assistance Equipment used: Rolling walker (2 wheeled) Transfers: Sit to/from Stand Sit to Stand: Supervision         General transfer comment: VCs safety, hand placement.  Ambulation/Gait Ambulation/Gait assistance: Supervision Gait Distance (Feet): 125 Feet Assistive device: Rolling walker (2 wheeled) Gait Pattern/deviations: Step-through pattern;Decreased stride length     General Gait Details: VCs safety, technique, sequence.   Stairs Stairs: Yes Stairs assistance: Min guard Stair Management: Step to pattern;Forwards;One rail Right Number of Stairs: 3 General stair comments: VCs safety, technique, sequence. Close guard for safety.   Wheelchair Mobility    Modified Rankin (Stroke Patients Only)       Balance Overall balance assessment: Mild deficits observed, not formally tested         Standing balance support: Bilateral upper extremity supported Standing balance-Leahy Scale: Poor                               Cognition Arousal/Alertness: Awake/alert Behavior During Therapy: WFL for tasks assessed/performed Overall Cognitive Status: Within Functional Limits for tasks assessed                                        Exercises Total Joint Exercises Ankle Circles/Pumps: AROM;Both;10 reps;Supine Quad Sets: AROM;Both;10 reps;Supine Heel Slides: AAROM;Left;10 reps;Supine Hip ABduction/ADduction: AAROM;Left;10 reps;Supine;AROM Straight Leg Raises: AAROM;Left;10 reps;Supine;AROM Goniometric ROM: ~10-65 degrees    General Comments        Pertinent Vitals/Pain Pain Assessment: 0-10 Pain Score: 5  Pain Location: L knee Pain Descriptors / Indicators: Aching;Sore Pain Intervention(s): Monitored during session    Home Living                      Prior Function            PT Goals (current goals can now be found in the care plan section) Acute Rehab PT Goals Patient Stated Goal: home PT Goal Formulation: With patient Time For Goal Achievement: 10/13/19 Potential to Achieve Goals: Good Progress towards PT goals: Progressing toward goals    Frequency    7X/week      PT Plan Current plan remains appropriate    Co-evaluation              AM-PAC PT "6 Clicks" Mobility   Outcome Measure  Help needed turning from your back to your side while in  a flat bed without using bedrails?: A Little Help needed moving from lying on your back to sitting on the side of a flat bed without using bedrails?: A Little Help needed moving to and from a bed to a chair (including a wheelchair)?: A Little Help needed standing up from a chair using your arms (e.g., wheelchair or bedside chair)?: A Little Help needed to walk in hospital room?: A Little Help needed climbing 3-5 steps with a railing? : A Little 6 Click Score: 18    End of Session Equipment Utilized During Treatment: Gait belt Activity Tolerance: Patient tolerated treatment well Patient left: in  bed;with call bell/phone within reach;with family/visitor present   PT Visit Diagnosis: Pain;Other abnormalities of gait and mobility (R26.89) Pain - part of body: Knee     Time: 3568-6168 PT Time Calculation (min) (ACUTE ONLY): 17 min  Charges:  $Gait Training: 8-22 mins                         Weston Anna, [place] Pager: [phone] Office: [phone]

## 2019-09-30 NOTE — Progress Notes (Signed)
RW ordered through [employer] and delivered to the patient room  

## 2019-09-30 NOTE — Anesthesia Postprocedure Evaluation (Signed)
Anesthesia Post Note  Patient: Jean Hernandez  Procedure(s) Performed: TOTAL KNEE ARTHROPLASTY (Left Knee)     Patient location during evaluation: PACU Anesthesia Type: General Level of consciousness: awake and alert Pain management: pain level controlled Vital Signs Assessment: post-procedure vital signs reviewed and stable Respiratory status: spontaneous breathing, nonlabored ventilation, respiratory function stable and patient connected to nasal cannula oxygen Cardiovascular status: blood pressure returned to baseline and stable Postop Assessment: no apparent nausea or vomiting Anesthetic complications: no    Last Vitals:  Vitals:   09/29/19 0613 09/29/19 0926  BP: 122/81 125/81  Pulse: 93 94  Resp: 18 18  Temp: 36.5 C 36.8 C  SpO2: 97% 95%    Last Pain:  Vitals:   09/29/19 1316  TempSrc:   PainSc: [place]

## 2019-09-30 NOTE — Discharge Summary (Signed)
Physician Discharge Summary   Patient ID: Jean Hernandez MRN: [id number] DOB/AGE: [birth year] 50 y.o.  Admit date: 09/28/2019 Discharge date: 09/29/2019  Primary Diagnosis: Osteoarthritis, left knee   Admission Diagnoses:  Past Medical History:  Diagnosis Date  . Anxiety   . Asthma    Allergy induced  . GERD (gastroesophageal reflux disease)   . Hypothyroidism   . Rheumatoid arthritis ([place])   . Sleep apnea    mild no mask   Discharge Diagnoses:   Principal Problem:   OA (osteoarthritis) of knee  Estimated body mass index is 34.76 kg/m as calculated from the following:   Height as of this encounter: 5' 5.5" (1.664 m).   Weight as of this encounter: 96.2 kg.  Procedure:  Procedure(s) (LRB): TOTAL KNEE ARTHROPLASTY (Left)   Consults: None  HPI: Jean Hernandez is a 48 y.o. year old female with end stage OA of her left knee with progressively worsening pain and dysfunction. She has constant pain, with activity and at rest and significant functional deficits with difficulties even with ADLs. She has had extensive non-op management including analgesics, injections of cortisone and viscosupplements, and home exercise program, but remains in significant pain with significant dysfunction. Radiographs show bone on bone arthritis medial and patellofemoral. She presents now for left Total Knee Arthroplasty.    Laboratory Data: Admission on 09/28/2019, Discharged on 09/29/2019  Component Date Value Ref Range Status  . WBC 09/29/2019 13.3* 4.0 - 10.5 K/uL Final  . RBC 09/29/2019 4.52  3.87 - 5.11 MIL/uL Final  . Hemoglobin 09/29/2019 14.1  12.0 - 15.0 g/dL Final  . HCT 09/29/2019 42.5  36.0 - 46.0 % Final  . MCV 09/29/2019 94.0  80.0 - 100.0 fL Final  . MCH 09/29/2019 31.2  26.0 - 34.0 pg Final  . MCHC 09/29/2019 33.2  30.0 - 36.0 g/dL Final  . RDW 09/29/2019 12.2  11.5 - 15.5 % Final  . Platelets 09/29/2019 215  150 - 400 K/uL Final  . nRBC 09/29/2019 0.0  0.0 - 0.2 % Final   Performed at [facility], [place] [address], [place], [place] [address]  . Sodium 09/29/2019 137  135 - 145 mmol/L Final  . Potassium 09/29/2019 4.1  3.5 - 5.1 mmol/L Final  . Chloride 09/29/2019 104  98 - 111 mmol/L Final  . CO2 09/29/2019 23  22 - 32 mmol/L Final  . Glucose, Bld 09/29/2019 173* 70 - 99 mg/dL Final  . BUN 09/29/2019 10  6 - 20 mg/dL Final  . Creatinine, Ser 09/29/2019 0.77  0.44 - 1.00 mg/dL Final  . Calcium 09/29/2019 8.9  8.9 - 10.3 mg/dL Final  . GFR calc non Af Amer 09/29/2019 >60  >60 mL/min Final  . GFR calc Af Amer 09/29/2019 >60  >60 mL/min Final  . Anion gap 09/29/2019 10  5 - 15 Final   Performed at [facility], [place] [address], [place], [place] [address]  Hospital Outpatient Visit on 09/24/2019  Component Date Value Ref Range Status  . SARS-CoV-2, NAA 09/24/2019 NOT DETECTED  NOT DETECTED Final   Comment: (NOTE) This nucleic acid amplification test was developed and its performance characteristics determined by [employer]. Nucleic acid amplification tests include PCR and TMA. This test has not been FDA cleared or approved. This test has been authorized by FDA under an Emergency Use Authorization (EUA). This test is only authorized for the duration of time the declaration that circumstances exist justifying the authorization of the  emergency use of in vitro diagnostic tests for detection of SARS-CoV-2 virus and/or diagnosis of COVID-19 infection under section 564(b)(1) of the Act, 21 U.S.C. 947SJG-2(E) (1), unless the authorization is terminated or revoked sooner. When diagnostic testing is negative, the possibility of a false negative result should be considered in the context of a patient's recent exposures and the presence of clinical signs and symptoms consistent with COVID-19. An individual without symptoms of COVID- 19 and who is not shedding SARS-CoV-2 vi                          rus would expect to have  a negative (not detected) result in this assay. Performed At: [facility] [place], [place] [id number] Rush Farmer MD YY:[id number]   . Coronavirus Source 09/24/2019 NASOPHARYNGEAL   Final   Performed at [facility], [place] [address], [place], [place] [address]  Hospital Outpatient Visit on 09/24/2019  Component Date Value Ref Range Status  . aPTT 09/24/2019 39* 24 - 36 seconds Final   Comment:        IF BASELINE aPTT IS ELEVATED, SUGGEST PATIENT RISK ASSESSMENT BE USED TO DETERMINE APPROPRIATE ANTICOAGULANT THERAPY. Performed at [facility], [place] [address] [place], [place] [address]   . WBC 09/24/2019 6.9  4.0 - 10.5 K/uL Final  . RBC 09/24/2019 4.54  3.87 - 5.11 MIL/uL Final  . Hemoglobin 09/24/2019 14.6  12.0 - 15.0 g/dL Final  . HCT 09/24/2019 42.4  36.0 - 46.0 % Final  . MCV 09/24/2019 93.4  80.0 - 100.0 fL Final  . MCH 09/24/2019 32.2  26.0 - 34.0 pg Final  . MCHC 09/24/2019 34.4  30.0 - 36.0 g/dL Final  . RDW 09/24/2019 12.2  11.5 - 15.5 % Final  . Platelets 09/24/2019 221  150 - 400 K/uL Final  . nRBC 09/24/2019 0.0  0.0 - 0.2 % Final   Performed at [facility], [place] [address], [place], [place] [address]  . Sodium 09/24/2019 141  135 - 145 mmol/L Final  . Potassium 09/24/2019 3.7  3.5 - 5.1 mmol/L Final  . Chloride 09/24/2019 105  98 - 111 mmol/L Final  . CO2 09/24/2019 27  22 - 32 mmol/L Final  . Glucose, Bld 09/24/2019 111* 70 - 99 mg/dL Final  . BUN 09/24/2019 15  6 - 20 mg/dL Final  . Creatinine, Ser 09/24/2019 0.77  0.44 - 1.00 mg/dL Final  . Calcium 09/24/2019 9.2  8.9 - 10.3 mg/dL Final  . Total Protein 09/24/2019 7.1  6.5 - 8.1 g/dL Final  . Albumin 09/24/2019 4.1  3.5 - 5.0 g/dL Final  . AST 09/24/2019 24  15 - 41 U/L Final  . ALT 09/24/2019 27  0 - 44 U/L Final  . Alkaline Phosphatase 09/24/2019 79  38 - 126 U/L Final  . Total Bilirubin 09/24/2019 0.7  0.3 - 1.2 mg/dL Final  . GFR calc  non Af Amer 09/24/2019 >60  >60 mL/min Final  . GFR calc Af Amer 09/24/2019 >60  >60 mL/min Final  . Anion gap 09/24/2019 9  5 - 15 Final   Performed at [facility], [place] [address] [place], [place] [address]  . Prothrombin Time 09/24/2019 11.8  11.4 - 15.2 seconds Final  . INR 09/24/2019 0.9  0.8 - 1.2 Final   Comment: (NOTE) INR goal varies based on device and disease states. Performed at [facility], [place] [address], [place], [place] [address]   . ABO/RH(D) 09/24/2019 A POS   Final  . Antibody Screen 09/24/2019 NEG   Final  . Sample Expiration 09/24/2019 10/01/2019,2359   Final  . Extend sample reason 09/24/2019    Final                   Value:NO TRANSFUSIONS OR PREGNANCY IN THE PAST 3 MONTHS Performed at [facility], [place] [address], [place], [place] [address]   . MRSA, PCR 09/24/2019 NEGATIVE  NEGATIVE Final  . Staphylococcus aureus 09/24/2019 POSITIVE* NEGATIVE Final   Comment: (NOTE) The Xpert SA Assay (FDA approved for NASAL specimens in patients 69 years of age and older), is one component of a comprehensive surveillance program. It is not intended to diagnose infection nor to guide or monitor treatment. Performed at [facility], [place] [address] [place], [place] [address]   . ABO/RH(D) 09/24/2019    Final                   Value:A POS Performed at [facility], [place] [address] [place], [place] [address]   Office Visit on 08/27/2019  Component Date Value Ref Range Status  . WBC 08/27/2019 8.9  3.4 - 10.8 x10E3/uL Final  . RBC 08/27/2019 4.95  3.77 - 5.28 x10E6/uL Final  . Hemoglobin 08/27/2019 15.3  11.1 - 15.9 g/dL Final  . Hematocrit 08/27/2019 45.5  34.0 - 46.6 % Final  . MCV 08/27/2019 92  79 - 97 fL Final  . MCH 08/27/2019 30.9  26.6 - 33.0 pg Final  . MCHC 08/27/2019 33.6  31.5 - 35.7 g/dL Final  . RDW 08/27/2019 11.9  11.7 - 15.4 % Final  . Platelets 08/27/2019  217  150 - 450 x10E3/uL Final  . Neutrophils 08/27/2019 53  Not Estab. % Final  . Lymphs 08/27/2019 37  Not Estab. % Final  . Monocytes 08/27/2019 8  Not Estab. % Final  . Eos 08/27/2019 2  Not Estab. % Final  . Basos 08/27/2019 0  Not Estab. % Final  . Neutrophils Absolute 08/27/2019 4.6  1.4 - 7.0 x10E3/uL Final  . Lymphocytes Absolute 08/27/2019 3.3* 0.7 - 3.1 x10E3/uL Final  . Monocytes Absolute 08/27/2019 0.7  0.1 - 0.9 x10E3/uL Final  . EOS (ABSOLUTE) 08/27/2019 0.2  0.0 - 0.4 x10E3/uL Final  . Basophils Absolute 08/27/2019 0.0  0.0 - 0.2 x10E3/uL Final  . Immature Granulocytes 08/27/2019 0  Not Estab. % Final  . Immature Grans (Abs) 08/27/2019 0.0  0.0 - 0.1 x10E3/uL Final  . Glucose 08/27/2019 162* 65 - 99 mg/dL Final  . BUN 08/27/2019 13  6 - 24 mg/dL Final  . Creatinine, Ser 08/27/2019 1.05* 0.57 - 1.00 mg/dL Final  . GFR calc non Af Amer 08/27/2019 62  >59 mL/min/1.73 Final  . GFR calc Af Amer 08/27/2019 72  >59 mL/min/1.73 Final  . BUN/Creatinine Ratio 08/27/2019 12  9 - 23 Final  . Sodium 08/27/2019 139  134 - 144 mmol/L Final  . Potassium 08/27/2019 4.0  3.5 - 5.2 mmol/L Final  . Chloride 08/27/2019 102  96 - 106 mmol/L Final  . CO2 08/27/2019 21  20 - 29 mmol/L Final  . Calcium 08/27/2019 9.6  8.7 - 10.2 mg/dL Final  . Total Protein 08/27/2019 6.8  6.0 - 8.5 g/dL Final  . Albumin 08/27/2019 4.4  3.8 - 4.8 g/dL Final  . Globulin, Total 08/27/2019 2.4  1.5 - 4.5 g/dL Final  . Albumin/Globulin Ratio 08/27/2019 1.8  1.2 - 2.2 Final  . Bilirubin Total 08/27/2019 0.3  0.0 - 1.2 mg/dL Final  . Alkaline Phosphatase 08/27/2019 99  39 - 117 IU/L Final  . AST 08/27/2019 25  0 - 40 IU/L Final  . ALT 08/27/2019 23  0 - 32 IU/L Final  . TSH 08/27/2019 1.520  0.450 - 4.500 uIU/mL Final  . Free T4 08/27/2019 1.09  0.82 - 1.77 ng/dL Final     X-Rays:No results found.  EKG: Orders placed or performed in visit on 04/22/17  . EKG 12-Lead     Hospital Course: Jean Hernandez is a 48  y.o. who was admitted to [facility]. They were brought to the operating room on 09/28/2019 and underwent Procedure(s): TOTAL KNEE ARTHROPLASTY.  Patient tolerated the procedure well and was later transferred to the recovery room and then to the orthopaedic floor for postoperative care. They were given PO and IV analgesics for pain control following their surgery. They were given 24 hours of postoperative antibiotics of  Anti-infectives (From admission, onward)   Start     Dose/Rate Route Frequency Ordered Stop   09/28/19 2100  ceFAZolin (ANCEF) IVPB 2g/100 mL premix     2 g 200 mL/hr over 30 Minutes Intravenous Every 6 hours 09/28/19 1744 09/29/19 0334   09/28/19 1119  ceFAZolin (ANCEF) 2-4 GM/100ML-% IVPB    Note to Pharmacy: Randa Evens  : cabinet override      09/28/19 1119 09/28/19 1443   09/28/19 1115  ceFAZolin (ANCEF) IVPB 2g/100 mL premix     2 g 200 mL/hr over 30 Minutes Intravenous On call to O.R. 09/28/19 1103 09/28/19 1513     and started on DVT prophylaxis in the form of Aspirin.   PT and OT were ordered for total joint protocol. Discharge planning consulted to help with postop disposition and equipment needs.  Patient had a good night on the evening of surgery. They started to get up OOB with therapy on POD #1. Pt was seen during rounds and was ready to go home pending progress with therapy. Hemovac drain was pulled without difficulty. She worked with therapy on POD #1 and was meeting her goals. Pt was discharged to home later that day in stable condition.  Diet: Regular diet Activity: WBAT Follow-up: in 2 weeks Disposition: Home with outpatient PT at [facility] in [place] Discharged Condition: stable   Discharge Instructions    Call MD / Call 911   Complete by: As directed    If you experience chest pain or shortness of breath, CALL 911 and be transported to the hospital emergency room.  If you develope a fever above 101 F, pus (white drainage) or increased  drainage or redness at the wound, or calf pain, call your surgeon's office.   Change dressing   Complete by: As directed    Change dressing on Wednesday, then change the dressing daily with sterile 4 x 4 inch gauze dressing and apply TED hose.   Constipation Prevention   Complete by: As directed    Drink plenty of fluids.  Prune juice may be helpful.  You may use a stool softener, such as Colace (over the counter) 100 mg twice a day.  Use MiraLax (over the counter) for constipation as needed.   Diet - low sodium heart healthy   Complete by: As directed    Discharge instructions   Complete by: As directed  Dr. Gaynelle Arabian Total Joint Specialist [facility] [address], [place], [place] [address] [phone]  TOTAL KNEE REPLACEMENT POSTOPERATIVE DIRECTIONS  Knee Rehabilitation, Guidelines Following Surgery  Results after knee surgery are often greatly improved when you follow the exercise, range of motion and muscle strengthening exercises prescribed by your doctor. Safety measures are also important to protect the knee from further injury. Any time any of these exercises cause you to have increased pain or swelling in your knee joint, decrease the amount until you are comfortable again and slowly increase them. If you have problems or questions, call your caregiver or physical therapist for advice.   HOME CARE INSTRUCTIONS  Remove items at home which could result in a fall. This includes throw rugs or furniture in walking pathways.  ICE to the affected knee every three hours for 30 minutes at a time and then as needed for pain and swelling.  Continue to use ice on the knee for pain and swelling from surgery. You may notice swelling that will progress down to the foot and ankle.  This is normal after surgery.  Elevate the leg when you are not up walking on it.   Continue to use the breathing machine which will help keep your temperature down.  It is common for your  temperature to cycle up and down following surgery, especially at night when you are not up moving around and exerting yourself.  The breathing machine keeps your lungs expanded and your temperature down. Do not place pillow under knee, focus on keeping the knee straight while resting   DIET You may resume your previous home diet once your are discharged from the hospital.  DRESSING / WOUND CARE / SHOWERING You may change your dressing 3-5 days after surgery.  Then change the dressing every day with sterile gauze.  Please use good hand washing techniques before changing the dressing.  Do not use any lotions or creams on the incision until instructed by your surgeon. You may start showering once you are discharged home but do not submerge the incision under water. Just pat the incision dry and apply a dry gauze dressing on daily. Change the surgical dressing daily and reapply a dry dressing each time.  ACTIVITY Walk with your walker as instructed. Use walker as long as suggested by your caregivers. Avoid periods of inactivity such as sitting longer than an hour when not asleep. This helps prevent blood clots.  You may resume a sexual relationship in one month or when given the OK by your doctor.  You may return to work once you are cleared by your doctor.  Do not drive a car for 6 weeks or until released by you surgeon.  Do not drive while taking narcotics.  WEIGHT BEARING Weight bearing as tolerated with assist device (walker, cane, etc) as directed, use it as long as suggested by your surgeon or therapist, typically at least 4-6 weeks.  POSTOPERATIVE CONSTIPATION PROTOCOL Constipation - defined medically as fewer than three stools per week and severe constipation as less than one stool per week.  One of the most common issues patients have following surgery is constipation.  Even if you have a regular bowel pattern at home, your normal regimen is likely to be disrupted due to multiple  reasons following surgery.  Combination of anesthesia, postoperative narcotics, change in appetite and fluid intake all can affect your bowels.  In order to avoid complications following surgery, here are some recommendations in order  to help you during your recovery period.  Colace (docusate) - Pick up an over-the-counter form of Colace or another stool softener and take twice a day as long as you are requiring postoperative pain medications.  Take with a full glass of water daily.  If you experience loose stools or diarrhea, hold the colace until you stool forms back up.  If your symptoms do not get better within 1 week or if they get worse, check with your doctor.  Dulcolax (bisacodyl) - Pick up over-the-counter and take as directed by the product packaging as needed to assist with the movement of your bowels.  Take with a full glass of water.  Use this product as needed if not relieved by Colace only.   MiraLax (polyethylene glycol) - Pick up over-the-counter to have on hand.  MiraLax is a solution that will increase the amount of water in your bowels to assist with bowel movements.  Take as directed and can mix with a glass of water, juice, soda, coffee, or tea.  Take if you go more than two days without a movement. Do not use MiraLax more than once per day. Call your doctor if you are still constipated or irregular after using this medication for 7 days in a row.  If you continue to have problems with postoperative constipation, please contact the office for further assistance and recommendations.  If you experience "the worst abdominal pain ever" or develop nausea or vomiting, please contact the office immediatly for further recommendations for treatment.  ITCHING  If you experience itching with your medications, try taking only a single pain pill, or even half a pain pill at a time.  You can also use Benadryl over the counter for itching or also to help with sleep.   TED HOSE STOCKINGS Wear  the elastic stockings on both legs for three weeks following surgery during the day but you may remove then at night for sleeping.  MEDICATIONS See your medication summary on the "After Visit Summary" that the nursing staff will review with you prior to discharge.  You may have some home medications which will be placed on hold until you complete the course of blood thinner medication.  It is important for you to complete the blood thinner medication as prescribed by your surgeon.  Continue your approved medications as instructed at time of discharge.  PRECAUTIONS If you experience chest pain or shortness of breath - call 911 immediately for transfer to the hospital emergency department.  If you develop a fever greater that 101 F, purulent drainage from wound, increased redness or drainage from wound, foul odor from the wound/dressing, or calf pain - CONTACT YOUR SURGEON.                                                   FOLLOW-UP APPOINTMENTS Make sure you keep all of your appointments after your operation with your surgeon and caregivers. You should call the office at the above phone number and make an appointment for approximately two weeks after the date of your surgery or on the date instructed by your surgeon outlined in the "After Visit Summary".   RANGE OF MOTION AND STRENGTHENING EXERCISES  Rehabilitation of the knee is important following a knee injury or an operation. After just a few days of immobilization, the muscles of the thigh  which control the knee become weakened and shrink (atrophy). Knee exercises are designed to build up the tone and strength of the thigh muscles and to improve knee motion. Often times heat used for twenty to thirty minutes before working out will loosen up your tissues and help with improving the range of motion but do not use heat for the first two weeks following surgery. These exercises can be done on a training (exercise) mat, on the floor, on a table or on a  bed. Use what ever works the best and is most comfortable for you Knee exercises include:  Leg Lifts - While your knee is still immobilized in a splint or cast, you can do straight leg raises. Lift the leg to 60 degrees, hold for 3 sec, and slowly lower the leg. Repeat 10-20 times 2-3 times daily. Perform this exercise against resistance later as your knee gets better.  Quad and Hamstring Sets - Tighten up the muscle on the front of the thigh (Quad) and hold for 5-10 sec. Repeat this 10-20 times hourly. Hamstring sets are done by pushing the foot backward against an object and holding for 5-10 sec. Repeat as with quad sets.  Leg Slides: Lying on your back, slowly slide your foot toward your buttocks, bending your knee up off the floor (only go as far as is comfortable). Then slowly slide your foot back down until your leg is flat on the floor again. Angel Wings: Lying on your back spread your legs to the side as far apart as you can without causing discomfort.  A rehabilitation program following serious knee injuries can speed recovery and prevent re-injury in the future due to weakened muscles. Contact your doctor or a physical therapist for more information on knee rehabilitation.   IF YOU ARE TRANSFERRED TO A SKILLED REHAB FACILITY If the patient is transferred to a skilled rehab facility following release from the hospital, a list of the current medications will be sent to the facility for the patient to continue.  When discharged from the skilled rehab facility, please have the facility set up the patient's [place] prior to being released. Also, the skilled facility will be responsible for providing the patient with their medications at time of release from the facility to include their pain medication, the muscle relaxants, and their blood thinner medication. If the patient is still at the rehab facility at time of the two week follow up appointment, the skilled rehab facility will  also need to assist the patient in arranging follow up appointment in our office and any transportation needs.  MAKE SURE YOU:  Understand these instructions.  Get help right away if you are not doing well or get worse.    Pick up stool softner and laxative for home use following surgery while on pain medications. Do not submerge incision under water. Please use good hand washing techniques while changing dressing each day. May shower starting three days after surgery. Please use a clean towel to pat the incision dry following showers. Continue to use ice for pain and swelling after surgery. Do not use any lotions or creams on the incision until instructed by your surgeon.   Do not put a pillow under the knee. Place it under the heel.   Complete by: As directed    Driving restrictions   Complete by: As directed    No driving for two weeks   TED hose   Complete by: As directed  Use stockings (TED hose) for three weeks on both leg(s).  You may remove them at night for sleeping.   Weight bearing as tolerated   Complete by: As directed      Allergies as of 09/29/2019      Reactions   Amoxicillin Itching, Rash   REACTION: hives   Other Itching   Dilaudid   Oxycodone-acetaminophen Itching, Rash   Percocet [oxycodone-acetaminophen] Itching, Rash   Ultracet [tramadol-acetaminophen] Itching, Rash, Hives   REACTION: itching   Vicodin [hydrocodone-acetaminophen] Itching, Hives   Propoxyphene N-acetaminophen    REACTION: itching      Medication List    STOP taking these medications   naproxen 500 MG tablet Commonly known as: NAPROSYN     TAKE these medications   aspirin 325 MG EC tablet Take 1 tablet (325 mg total) by mouth 2 (two) times daily for 20 days. Then take one 81 mg aspirin once a day for three weeks. Then discontinue aspirin.   cetirizine 10 MG tablet Commonly known as: ZYRTEC Take 10 mg by mouth at bedtime.   chlorpheniramine 4 MG tablet Commonly known as:  CHLOR-TRIMETON Take 4 mg by mouth every 4 (four) hours.   Cimzia 2 X 200 MG Kit Generic drug: Certolizumab Pegol Inject into the skin every 30 (thirty) days.   CITRACAL PO Take 1 tablet by mouth 2 (two) times daily.   diphenhydrAMINE 12.5 MG/5ML elixir Commonly known as: BENADRYL Take 5-10 mLs (12.5-25 mg total) by mouth every 6 (six) hours as needed for itching.   Forteo 750 MCG/3ML injection Generic drug: teriparatide Inject 20 mcg into the skin daily.   gabapentin 300 MG capsule Commonly known as: NEURONTIN Take 1 capsule (300 mg total) by mouth 3 (three) times daily. Take a 300 mg capsule three times a day for two weeks following surgery.Then take a 300 mg capsule two times a day for two weeks. Then take a 300 mg capsule once a day for two weeks. Then resume 100 mg every night at bedtime. What changed:   medication strength  how much to take  when to take this  additional instructions   leflunomide 20 MG tablet Commonly known as: ARAVA On tablet What changed:   how much to take  how to take this  when to take this  additional instructions   levothyroxine 50 MCG tablet Commonly known as: SYNTHROID TAKE 1 TABLET DAILY What changed: when to take this   methocarbamol 500 MG tablet Commonly known as: ROBAXIN Take 1 tablet (500 mg total) by mouth every 6 (six) hours as needed for muscle spasms.   montelukast 10 MG tablet Commonly known as: SINGULAIR Take 1 tablet (10 mg total) by mouth at bedtime.   pantoprazole 40 MG tablet Commonly known as: PROTONIX TAKE 1 TABLET TWICE DAILY   tapentadol 50 MG tablet Commonly known as: NUCYNTA Take 1-2 tablets (50-100 mg total) by mouth every 6 (six) hours as needed for moderate pain or severe pain.   Vitamin D-1000 Max St 25 MCG (1000 UT) tablet Generic drug: Cholecalciferol Take 1,000 Units by mouth daily.            Discharge Care Instructions  (From admission, onward)         Start     Ordered    09/29/19 0000  Weight bearing as tolerated     09/29/19 0732   09/29/19 0000  Change dressing    Comments: Change dressing on Wednesday, then change the dressing daily with  sterile 4 x 4 inch gauze dressing and apply TED hose.   09/29/19 0732         Follow-up Information    Nasteho Glantz, [place], PA. Schedule an appointment as soon as possible for a visit in 2 week(s).   Specialty: Orthopedic Surgery Why: Make an appointment for the week of 10/12/19 Contact information: [address] [place] [address] [phone]           Signed: Theresa Duty, PA-C Orthopedic Surgery 09/30/2019, 12:32 PM

## 2019-10-02 ENCOUNTER — Ambulatory Visit: Payer: Federal, State, Local not specified - PPO | Attending: Orthopedic Surgery | Admitting: Physical Therapy

## 2019-10-02 ENCOUNTER — Other Ambulatory Visit: Payer: Self-pay

## 2019-10-02 DIAGNOSIS — M25662 Stiffness of left knee, not elsewhere classified: Secondary | ICD-10-CM

## 2019-10-02 DIAGNOSIS — M6281 Muscle weakness (generalized): Secondary | ICD-10-CM

## 2019-10-02 DIAGNOSIS — M25562 Pain in left knee: Secondary | ICD-10-CM | POA: Insufficient documentation

## 2019-10-02 DIAGNOSIS — G8929 Other chronic pain: Secondary | ICD-10-CM | POA: Diagnosis not present

## 2019-10-02 NOTE — Therapy (Signed)
[facility] [address] [place], [place], [address] Phone: [phone]   Fax:  [phone]  Physical Therapy Evaluation  Patient Details  Name: Jean Hernandez MRN: [id number] Date of Birth: Sep 17, 1971 Referring Provider (PT): Ollen Gross MD   Encounter Date: 10/02/2019  PT End of Session - 10/02/19 1101    Visit Number  1    Number of Visits  12    Date for PT Re-Evaluation  10/30/19    Authorization Type  FOTO AT LEAST EVERY 5TH VISIT.  PROGRESS NOTE AT 10TH VISIT.    PT Start Time  1017    PT Stop Time  1058    PT Time Calculation (min)  41 min    Activity Tolerance  Patient tolerated treatment well    Behavior During Therapy  WFL for tasks assessed/performed       Past Medical History:  Diagnosis Date  . Anxiety   . Asthma    Allergy induced  . GERD (gastroesophageal reflux disease)   . Hypothyroidism   . Rheumatoid arthritis (HCC)   . Sleep apnea    mild no mask    Past Surgical History:  Procedure Laterality Date  . ABDOMINAL HYSTERECTOMY     many surgeries for this  . C sections     x2  . colonsocopy  2012  . TOTAL KNEE ARTHROPLASTY Left 09/28/2019   Procedure: TOTAL KNEE ARTHROPLASTY;  Surgeon: Ollen Gross, MD;  Location: [facility];  Service: Orthopedics;  Laterality: Left;     There were no vitals filed for this visit.   Subjective Assessment - 10/02/19 1105    Subjective  COVID-19 screen performed prior to patient entering clinic. The patient underwent a left total knee replacement on 09/28/19.  She presents to the clinic today using a FWW.  Her resting pain-level is a 6/10 and higher with movement of her left knee.  She is trying to do her home exercises.  Encouraged her to do so and she was provided with additional exercises today.    Pertinent History  RA, GERD, Hypothyroidism.    How long can you walk comfortably?  Short distances around house.    Patient Stated Goals  Perform ADL's without pain.     Currently in Pain?  Yes    Pain Score  6     Pain Location  Knee    Pain Orientation  Left    Pain Descriptors / Indicators  Aching;Throbbing    Pain Type  Surgical pain    Pain Onset  More than a month ago    Pain Frequency  Constant    Aggravating Factors   Moving left knee.    Pain Relieving Factors  Rest and ice.         [facility] PT Assessment - 10/02/19 0001      Assessment   Medical Diagnosis  Left total knee replacement.    Referring Provider (PT)  Ollen Gross MD    Onset Date/Surgical Date  --   09/28/19 (surgery date).     Precautions   Precautions  --   No ultrasound.     Restrictions   Weight Bearing Restrictions  No      Balance Screen   Has the patient fallen in the past 6 months  No    Has the patient had a decrease in activity level because of a fear of falling?   No    Is the patient reluctant to leave their  home because of a fear of falling?   No      Prior Function   Level of Independence  Independent      Cognition   Overall Cognitive Status  --   Post-surgical dressing intact.     Observation/Other Assessments   Focus on Therapeutic Outcomes (FOTO)   72% limitation.      Observation/Other Assessments-Edema    Edema  --   Minimal plus.     ROM / Strength   AROM / PROM / Strength  AROM;Strength      AROM   Overall AROM Comments  Left knee in supine:  -21 degrees active to -15 degrees passive with active flexion to 90 degrees and passive to 95 degrees.      Strength   Overall Strength Comments  Left hip= 4/5 and left knee 4-/5.      Palpation   Palpation comment  C/o diffuse left left tenderness currently.      Ambulation/Gait   Gait Comments  Patient walking with FWW and was limiting weight bearing over her left LE with knee flexed.  Encouraged her to weight bear as tolerated as she was doing much better as she exited the clinic.                Objective measurements completed on examination: See above findings.       [facility] Adult PT Treatment/Exercise - 10/02/19 0001      Exercises   Exercises  Knee/Hip      Knee/Hip Exercises: Aerobic   Nustep  Level 1 x 8 minutes moving forward x 1 to increase flexion.      Modalities   Modalities  Vasopneumatic      Vasopneumatic   Number Minutes Vasopneumatic   8 minutes    Vasopnuematic Location   --   Left knee.   Vasopneumatic Pressure  Low                  PT Long Term Goals - 10/02/19 1323      PT LONG TERM GOAL #1   Title  Independent with a HEP.    Time  4    Period  Weeks    Status  New      PT LONG TERM GOAL #2   Title  Full left active knee extension in order to normalize gait.    Time  4    Period  Weeks    Status  New      PT LONG TERM GOAL #3   Title  Active knee flexion to 120 degrees+ so the patient can perform functional tasks and do so with pain not > 2-3/10.    Time  4    Period  Weeks    Status  New      PT LONG TERM GOAL #4   Title  Increase left knee strength to a solid 4+/5 to provide good stability for accomplishment of functional activities.    Time  4    Period  Weeks    Status  New      PT LONG TERM GOAL #5   Title  Perform a reciprocating stair gait with one railing with pain not > 2-3/10.    Time  4    Period  Weeks    Status  New             Plan - 10/02/19 1306    Clinical Impression Statement  The patient presents to  [facility] s/p left TKA performed on 09/28/19.  The patient is walking with a FWW with her left knee held in flexion.  She is currently lacking flexion and extension.  Encouraged her to perform her HEP to increase her  motion.  Her post-surgical dressing is inatact and she is compliant to using her TED hoses.  Patient will benefit from skilled physical therapy intervention to address deficits and pain.    Personal Factors and Comorbidities  Comorbidity 1;Comorbidity 2    Comorbidities  RA, GERD, Hypothyroidism.    Examination-Activity Limitations  Locomotion  Level;Other;Stairs    Examination-Participation Restrictions  Other;Community Activity    Stability/Clinical Decision Making  Stable/Uncomplicated    Clinical Decision Making  Low    Rehab Potential  Excellent    PT Frequency  3x / week    PT Duration  4 weeks    PT Treatment/Interventions  ADLs/Self Care Home Management;Cryotherapy;Electrical Stimulation;Gait training;Moist Heat;Stair training;Functional mobility training;Therapeutic activities;Therapeutic exercise;Neuromuscular re-education;Manual techniques;Patient/family education;Vasopneumatic Device    PT Next Visit Plan  Nustep with progression to stationary bike, ROM.  Progress into TKA protocol.  Vasopnuematic.    Consulted and Agree with Plan of Care  Patient       Patient will benefit from skilled therapeutic intervention in order to improve the following deficits and impairments:  Abnormal gait, Difficulty walking, Decreased activity tolerance, Decreased strength, Increased edema, Decreased range of motion, Pain  Visit Diagnosis: Chronic pain of left knee - Plan: PT plan of care cert/re-cert  Stiffness of left knee, not elsewhere classified - Plan: PT plan of care cert/re-cert  Muscle weakness (generalized) - Plan: PT plan of care cert/re-cert     Problem List Patient Active Problem List   Diagnosis Date Noted  . OA (osteoarthritis) of knee 09/28/2019  . Rheumatoid arthritis involving multiple sites (HCC) 07/19/2017  . Upper airway cough syndrome 07/18/2017  . Hyperlipidemia 08/06/2008  . ANAL FISSURE 08/06/2008  . RECTAL BLEEDING 08/06/2008  . SKIN TAG 08/06/2008    Meekah Math, [place] MPT 10/02/2019, 1:28 PM  [facility] [address] [place], [place], [address] Phone: [phone]   Fax:  [phone]  Name: THAO BAUZA MRN: [id number] Date of Birth: [birth year]

## 2019-10-05 ENCOUNTER — Ambulatory Visit: Payer: Federal, State, Local not specified - PPO | Admitting: Physical Therapy

## 2019-10-05 ENCOUNTER — Other Ambulatory Visit: Payer: Self-pay

## 2019-10-05 DIAGNOSIS — M6281 Muscle weakness (generalized): Secondary | ICD-10-CM

## 2019-10-05 DIAGNOSIS — M25662 Stiffness of left knee, not elsewhere classified: Secondary | ICD-10-CM

## 2019-10-05 DIAGNOSIS — M25562 Pain in left knee: Secondary | ICD-10-CM | POA: Diagnosis not present

## 2019-10-05 DIAGNOSIS — G8929 Other chronic pain: Secondary | ICD-10-CM | POA: Diagnosis not present

## 2019-10-05 NOTE — Therapy (Signed)
[facility] [address] [place], [place], [address] Phone: [phone]   Fax:  [phone]  Physical Therapy Treatment  Patient Details  Name: Jean Hernandez MRN: [id number] Date of Birth: [birth year] Referring Provider (PT): Ollen Gross MD   Encounter Date: 10/05/2019  PT End of Session - 10/05/19 1503    Visit Number  2    Number of Visits  12    Date for PT Re-Evaluation  10/30/19    Authorization Type  FOTO AT LEAST EVERY 5TH VISIT.  PROGRESS NOTE AT 10TH VISIT.    PT Start Time  0145    PT Stop Time  0243    PT Time Calculation (min)  58 min    Activity Tolerance  Patient tolerated treatment well    Behavior During Therapy  WFL for tasks assessed/performed       Past Medical History:  Diagnosis Date  . Anxiety   . Asthma    Allergy induced  . GERD (gastroesophageal reflux disease)   . Hypothyroidism   . Rheumatoid arthritis (HCC)   . Sleep apnea    mild no mask    Past Surgical History:  Procedure Laterality Date  . ABDOMINAL HYSTERECTOMY     many surgeries for this  . C sections     x2  . colonsocopy  2012  . TOTAL KNEE ARTHROPLASTY Left 09/28/2019   Procedure: TOTAL KNEE ARTHROPLASTY;  Surgeon: Ollen Gross, MD;  Location: [facility];  Service: Orthopedics;  Laterality: Left;     There were no vitals filed for this visit.  Subjective Assessment - 10/05/19 1446    Subjective  COVID-19 screen performed prior to patient entering clinic.  Out of pain med so my pain is high.  That seated stretch was painful.    Pertinent History  RA, GERD, Hypothyroidism.    How long can you walk comfortably?  Short distances around house.    Patient Stated Goals  Perform ADL's without pain.    Currently in Pain?  Yes    Pain Score  7     Pain Location  Knee    Pain Descriptors / Indicators  Aching;Throbbing    Pain Onset  More than a month ago         [facility] PT Assessment - 10/05/19 0001      AROM   Overall AROM  Comments  105 degrees of left knee flexion.                   [facility] Adult PT Treatment/Exercise - 10/05/19 0001      Exercises   Exercises  Knee/Hip      Knee/Hip Exercises: Aerobic   Nustep  Level 1 x 15 minutes moving forward x 2 to increase knee flexion.      Knee/Hip Exercises: Supine   Short Arc Quad Sets Limitations  16 minutes facilitated with Bi-Phasic (10 sec extension holds f/b 10 sec rest).      Modalities   Modalities  Estate agent Stimulation Location  Left knee.    Electrical Stimulation Action  IFC    Electrical Stimulation Parameters  1-10 Hz x 15 minutes.    Electrical Stimulation Goals  Pain      Vasopneumatic   Number Minutes Vasopneumatic   15 minutes    Vasopnuematic Location   --   Left knee.   Vasopneumatic Pressure  Low  PT Long Term Goals - 10/02/19 1323      PT LONG TERM GOAL #1   Title  Independent with a HEP.    Time  4    Period  Weeks    Status  New      PT LONG TERM GOAL #2   Title  Full left active knee extension in order to normalize gait.    Time  4    Period  Weeks    Status  New      PT LONG TERM GOAL #3   Title  Active knee flexion to 120 degrees+ so the patient can perform functional tasks and do so with pain not > 2-3/10.    Time  4    Period  Weeks    Status  New      PT LONG TERM GOAL #4   Title  Increase left knee strength to a solid 4+/5 to provide good stability for accomplishment of functional activities.    Time  4    Period  Weeks    Status  New      PT LONG TERM GOAL #5   Title  Perform a reciprocating stair gait with one railing with pain not > 2-3/10.    Time  4    Period  Weeks    Status  New            Plan - 10/05/19 1504    Clinical Impression Statement  The patient did great today.  She achieved 105 degrees of left knee flexion today.  She ran out of pain medication but is getting a refill.     Personal Factors and Comorbidities  Comorbidity 1;Comorbidity 2    Comorbidities  RA, GERD, Hypothyroidism.    Examination-Activity Limitations  Locomotion Level;Other;Stairs    Examination-Participation Restrictions  Other;Community Activity    Stability/Clinical Decision Making  Stable/Uncomplicated    Rehab Potential  Excellent    PT Frequency  3x / week    PT Duration  4 weeks    PT Treatment/Interventions  ADLs/Self Care Home Management;Cryotherapy;Electrical Stimulation;Gait training;Moist Heat;Stair training;Functional mobility training;Therapeutic activities;Therapeutic exercise;Neuromuscular re-education;Manual techniques;Patient/family education;Vasopneumatic Device    PT Next Visit Plan  Nustep with progression to stationary bike, ROM.  Progress into TKA protocol.  Vasopnuematic.    Consulted and Agree with Plan of Care  Patient       Patient will benefit from skilled therapeutic intervention in order to improve the following deficits and impairments:  Abnormal gait, Difficulty walking, Decreased activity tolerance, Decreased strength, Increased edema, Decreased range of motion, Pain  Visit Diagnosis: Chronic pain of left knee  Stiffness of left knee, not elsewhere classified  Muscle weakness (generalized)     Problem List Patient Active Problem List   Diagnosis Date Noted  . OA (osteoarthritis) of knee 09/28/2019  . Rheumatoid arthritis involving multiple sites ([place]) 07/19/2017  . Upper airway cough syndrome 07/18/2017  . Hyperlipidemia 08/06/2008  . ANAL FISSURE 08/06/2008  . RECTAL BLEEDING 08/06/2008  . SKIN TAG 08/06/2008    Ravon Mortellaro, [place] MPT 10/05/2019, 3:10 PM  [facility] [address] [place], [place], [address] Phone: [phone]   Fax:  [phone]  Name: Jean Hernandez MRN: [id number] Date of Birth: [birth year]

## 2019-10-06 ENCOUNTER — Encounter: Payer: Self-pay | Admitting: Physical Therapy

## 2019-10-06 ENCOUNTER — Ambulatory Visit: Payer: Federal, State, Local not specified - PPO | Admitting: Physical Therapy

## 2019-10-06 ENCOUNTER — Other Ambulatory Visit: Payer: Self-pay

## 2019-10-06 DIAGNOSIS — M6281 Muscle weakness (generalized): Secondary | ICD-10-CM

## 2019-10-06 DIAGNOSIS — G8929 Other chronic pain: Secondary | ICD-10-CM | POA: Diagnosis not present

## 2019-10-06 DIAGNOSIS — M25662 Stiffness of left knee, not elsewhere classified: Secondary | ICD-10-CM

## 2019-10-06 DIAGNOSIS — M25562 Pain in left knee: Secondary | ICD-10-CM | POA: Diagnosis not present

## 2019-10-06 NOTE — Therapy (Signed)
[facility] [place], [place], [address] Phone: [phone]   Fax:  [phone]  Physical Therapy Treatment  Patient Details  Name: Jean Hernandez MRN: [id number] Date of Birth: May 03, 1971 Referring Provider (PT): Gaynelle Arabian MD   Encounter Date: 10/06/2019  PT End of Session - 10/06/19 1205    Visit Number  3    Number of Visits  12    Date for PT Re-Evaluation  10/30/19    Authorization Type  FOTO AT LEAST EVERY 5TH VISIT.  PROGRESS NOTE AT 10TH VISIT.    PT Start Time  1022    PT Stop Time  1122    PT Time Calculation (min)  60 min    Activity Tolerance  Patient tolerated treatment well    Behavior During Therapy  WFL for tasks assessed/performed       Past Medical History:  Diagnosis Date  . Anxiety   . Asthma    Allergy induced  . GERD (gastroesophageal reflux disease)   . Hypothyroidism   . Rheumatoid arthritis ([place])   . Sleep apnea    mild no mask    Past Surgical History:  Procedure Laterality Date  . ABDOMINAL HYSTERECTOMY     many surgeries for this  . C sections     x2  . colonsocopy  2012  . TOTAL KNEE ARTHROPLASTY Left 09/28/2019   Procedure: TOTAL KNEE ARTHROPLASTY;  Surgeon: Gaynelle Arabian, MD;  Location: [facility];  Service: Orthopedics;  Laterality: Left;  63min    There were no vitals filed for this visit.  Subjective Assessment - 10/06/19 1142    Subjective  COVID-19 screen performed prior to patient entering clinic.  Doing okay.  Pharmacy didn't have my full prescription.    Pertinent History  RA, GERD, Hypothyroidism.    How long can you walk comfortably?  Short distances around house.    Patient Stated Goals  Perform ADL's without pain.    Currently in Pain?  Yes    Pain Score  7     Pain Location  Knee    Pain Orientation  Left    Pain Descriptors / Indicators  Aching;Throbbing    Pain Type  Surgical pain    Pain Onset  More than a month ago                        [facility] Adult PT Treatment/Exercise - 10/06/19 0001      Exercises   Exercises  Knee/Hip      Knee/Hip Exercises: Aerobic   Nustep  Level 1 x 15 minutes.      Knee/Hip Exercises: Supine   Short Arc Quad Sets Limitations  16 minutes facilitated with VMS to left quadriceps (10 sec extension holds f/b 10 sec rest).      Modalities   Modalities  Health visitor Stimulation Location  Left knee.    Electrical Stimulation Action  IFC    Electrical Stimulation Parameters  1-10 Hz x 15 minutes.    Electrical Stimulation Goals  Pain      Vasopneumatic   Number Minutes Vasopneumatic   15 minutes    Vasopnuematic Location   --   Left knee.   Vasopneumatic Pressure  Low                  PT Long Term Goals - 10/02/19 1323  PT LONG TERM GOAL #1   Title  Independent with a HEP.    Time  4    Period  Weeks    Status  New      PT LONG TERM GOAL #2   Title  Full left active knee extension in order to normalize gait.    Time  4    Period  Weeks    Status  New      PT LONG TERM GOAL #3   Title  Active knee flexion to 120 degrees+ so the patient can perform functional tasks and do so with pain not > 2-3/10.    Time  4    Period  Weeks    Status  New      PT LONG TERM GOAL #4   Title  Increase left knee strength to a solid 4+/5 to provide good stability for accomplishment of functional activities.    Time  4    Period  Weeks    Status  New      PT LONG TERM GOAL #5   Title  Perform a reciprocating stair gait with one railing with pain not > 2-3/10.    Time  4    Period  Weeks    Status  New            Plan - 10/06/19 1211    Clinical Impression Statement  Patient doing very well in spite of the fact she has not been able to take pain med as prescribed yet.  She was able to tolerate Nustep with seat one setting closer today.    Personal Factors and Comorbidities   Comorbidity 1;Comorbidity 2    Comorbidities  RA, GERD, Hypothyroidism.    Examination-Activity Limitations  Locomotion Level;Other;Stairs    Examination-Participation Restrictions  Other;Community Activity    Stability/Clinical Decision Making  Stable/Uncomplicated    Rehab Potential  Excellent    PT Frequency  3x / week    PT Duration  4 weeks    PT Treatment/Interventions  ADLs/Self Care Home Management;Cryotherapy;Electrical Stimulation;Gait training;Moist Heat;Stair training;Functional mobility training;Therapeutic activities;Therapeutic exercise;Neuromuscular re-education;Manual techniques;Patient/family education;Vasopneumatic Device    PT Next Visit Plan  Nustep with progression to stationary bike, ROM.  Progress into TKA protocol.  Vasopnuematic.    Consulted and Agree with Plan of Care  Patient       Patient will benefit from skilled therapeutic intervention in order to improve the following deficits and impairments:  Abnormal gait, Difficulty walking, Decreased activity tolerance, Decreased strength, Increased edema, Decreased range of motion, Pain  Visit Diagnosis: Chronic pain of left knee  Stiffness of left knee, not elsewhere classified  Muscle weakness (generalized)     Problem List Patient Active Problem List   Diagnosis Date Noted  . OA (osteoarthritis) of knee 09/28/2019  . Rheumatoid arthritis involving multiple sites (HCC) 07/19/2017  . Upper airway cough syndrome 07/18/2017  . Hyperlipidemia 08/06/2008  . ANAL FISSURE 08/06/2008  . RECTAL BLEEDING 08/06/2008  . SKIN TAG 08/06/2008    , [place] MPT 10/06/2019, 12:13 PM  [facility] [address] [place], [place], [address] Phone: [phone]   Fax:  [phone]  Name: Jean Hernandez MRN: [id number] Date of Birth: [birth year]

## 2019-10-08 ENCOUNTER — Ambulatory Visit: Payer: Federal, State, Local not specified - PPO | Admitting: Physical Therapy

## 2019-10-08 DIAGNOSIS — G8929 Other chronic pain: Secondary | ICD-10-CM

## 2019-10-08 DIAGNOSIS — M6281 Muscle weakness (generalized): Secondary | ICD-10-CM

## 2019-10-08 DIAGNOSIS — M25562 Pain in left knee: Secondary | ICD-10-CM

## 2019-10-08 DIAGNOSIS — M25662 Stiffness of left knee, not elsewhere classified: Secondary | ICD-10-CM

## 2019-10-08 DIAGNOSIS — M1712 Unilateral primary osteoarthritis, left knee: Secondary | ICD-10-CM | POA: Diagnosis not present

## 2019-10-08 NOTE — Therapy (Signed)
[facility] [place], [place], [address] Phone: [phone]   Fax:  [phone]  Patient Details  Name: Jean Hernandez MRN: [id number] Date of Birth: 20-Jan-1971 Referring Provider:  Claretta Fraise, MD  Encounter Date: 10/08/2019   Patient came into the clinic and states she had fallen down about 4 steps and twisted her knee and the pain was very intense.  No treatment today.  Patient called Ortho office and was able to get in today.   Delon Revelo, [place] MPT 10/08/2019, 9:45 AM  [facility] [address] [place], [place], [address] Phone: [phone]   Fax:  [phone]

## 2019-10-12 ENCOUNTER — Ambulatory Visit: Payer: Federal, State, Local not specified - PPO | Admitting: Physical Therapy

## 2019-10-12 ENCOUNTER — Other Ambulatory Visit: Payer: Self-pay

## 2019-10-12 DIAGNOSIS — G8929 Other chronic pain: Secondary | ICD-10-CM

## 2019-10-12 DIAGNOSIS — M6281 Muscle weakness (generalized): Secondary | ICD-10-CM | POA: Diagnosis not present

## 2019-10-12 DIAGNOSIS — M25562 Pain in left knee: Secondary | ICD-10-CM | POA: Diagnosis not present

## 2019-10-12 DIAGNOSIS — M25662 Stiffness of left knee, not elsewhere classified: Secondary | ICD-10-CM

## 2019-10-12 NOTE — Therapy (Signed)
[facility] [place], [place], [address] Phone: [phone]   Fax:  [phone]  Physical Therapy Treatment  Patient Details  Name: Jean Hernandez MRN: [id number] Date of Birth: 09-Nov-1970 Referring Provider (PT): Gaynelle Arabian MD   Encounter Date: 10/12/2019  PT End of Session - 10/12/19 1111    Visit Number  4    Number of Visits  12    Date for PT Re-Evaluation  10/30/19    Authorization Type  FOTO AT LEAST EVERY 5TH VISIT.  PROGRESS NOTE AT 10TH VISIT.    PT Start Time  1030    PT Stop Time  1118    PT Time Calculation (min)  48 min    Equipment Utilized During Treatment  Gait belt    Activity Tolerance  Patient tolerated treatment well    Behavior During Therapy  WFL for tasks assessed/performed       Past Medical History:  Diagnosis Date  . Anxiety   . Asthma    Allergy induced  . GERD (gastroesophageal reflux disease)   . Hypothyroidism   . Rheumatoid arthritis ([place])   . Sleep apnea    mild no mask    Past Surgical History:  Procedure Laterality Date  . ABDOMINAL HYSTERECTOMY     many surgeries for this  . C sections     x2  . colonsocopy  2012  . TOTAL KNEE ARTHROPLASTY Left 09/28/2019   Procedure: TOTAL KNEE ARTHROPLASTY;  Surgeon: Gaynelle Arabian, MD;  Location: [facility];  Service: Orthopedics;  Laterality: Left;  52min    There were no vitals filed for this visit.  Subjective Assessment - 10/12/19 1053    Subjective  COVID-19 screen performed prior to patient entering clinic.  Doctor took an Publishing rights manager and everything looked good.  Said I was ahead of schedule.    Pertinent History  RA, GERD, Hypothyroidism.    How long can you walk comfortably?  Short distances around house.    Patient Stated Goals  Perform ADL's without pain.    Currently in Pain?  Yes    Pain Score  5     Pain Location  Knee    Pain Orientation  Left    Pain Descriptors / Indicators  Aching;Throbbing    Pain Onset  More than a  month ago                       [facility] Adult PT Treatment/Exercise - 10/12/19 0001      Exercises   Exercises  Knee/Hip      Knee/Hip Exercises: Aerobic   Nustep  Level 3 x 15 minutes.      Knee/Hip Exercises: Supine   Short Arc Quad Sets Limitations  16 minutes facilitaed with Bi-Phasic e'stim to left quads (10 sec extension holds and 10 sec rest).      Modalities   Modalities  Vasopneumatic      Vasopneumatic   Number Minutes Vasopneumatic   10 minutes    Vasopnuematic Location   --   Left knee.   Vasopneumatic Pressure  Low                  PT Long Term Goals - 10/02/19 1323      PT LONG TERM GOAL #1   Title  Independent with a HEP.    Time  4    Period  Weeks    Status  New  PT LONG TERM GOAL #2   Title  Full left active knee extension in order to normalize gait.    Time  4    Period  Weeks    Status  New      PT LONG TERM GOAL #3   Title  Active knee flexion to 120 degrees+ so the patient can perform functional tasks and do so with pain not > 2-3/10.    Time  4    Period  Weeks    Status  New      PT LONG TERM GOAL #4   Title  Increase left knee strength to a solid 4+/5 to provide good stability for accomplishment of functional activities.    Time  4    Period  Weeks    Status  New      PT LONG TERM GOAL #5   Title  Perform a reciprocating stair gait with one railing with pain not > 2-3/10.    Time  4    Period  Weeks    Status  New            Plan - 10/12/19 1109    Clinical Impression Statement  Patient doing very well.  Encouraged her to work on extension.  She presented to the clinic today without assistive device.    Personal Factors and Comorbidities  Comorbidity 1;Comorbidity 2    Examination-Activity Limitations  Locomotion Level;Other;Stairs    Examination-Participation Restrictions  Other;Community Activity    Stability/Clinical Decision Making  Stable/Uncomplicated    Rehab Potential  Excellent    PT  Frequency  3x / week    PT Duration  4 weeks    PT Treatment/Interventions  ADLs/Self Care Home Management;Cryotherapy;Electrical Stimulation;Gait training;Moist Heat;Stair training;Functional mobility training;Therapeutic activities;Therapeutic exercise;Neuromuscular re-education;Manual techniques;Patient/family education;Vasopneumatic Device    PT Next Visit Plan  Nustep with progression to stationary bike, ROM.  Progress into TKA protocol.  Vasopnuematic.    Consulted and Agree with Plan of Care  Patient       Patient will benefit from skilled therapeutic intervention in order to improve the following deficits and impairments:  Abnormal gait, Difficulty walking, Decreased activity tolerance, Decreased strength, Increased edema, Decreased range of motion, Pain  Visit Diagnosis: Chronic pain of left knee  Stiffness of left knee, not elsewhere classified  Muscle weakness (generalized)     Problem List Patient Active Problem List   Diagnosis Date Noted  . OA (osteoarthritis) of knee 09/28/2019  . Rheumatoid arthritis involving multiple sites (HCC) 07/19/2017  . Upper airway cough syndrome 07/18/2017  . Hyperlipidemia 08/06/2008  . ANAL FISSURE 08/06/2008  . RECTAL BLEEDING 08/06/2008  . SKIN TAG 08/06/2008    Melville Engen, [place] MPT 10/12/2019, 11:18 AM  [facility] [address] [place], [place], [address] Phone: [phone]   Fax:  [phone]  Name: Jean Hernandez MRN: [id number] Date of Birth: April 27, 1971

## 2019-10-13 ENCOUNTER — Other Ambulatory Visit: Payer: Self-pay

## 2019-10-13 ENCOUNTER — Ambulatory Visit: Payer: Federal, State, Local not specified - PPO | Admitting: Physical Therapy

## 2019-10-13 DIAGNOSIS — M6281 Muscle weakness (generalized): Secondary | ICD-10-CM

## 2019-10-13 DIAGNOSIS — G8929 Other chronic pain: Secondary | ICD-10-CM | POA: Diagnosis not present

## 2019-10-13 DIAGNOSIS — M25662 Stiffness of left knee, not elsewhere classified: Secondary | ICD-10-CM | POA: Diagnosis not present

## 2019-10-13 DIAGNOSIS — M25562 Pain in left knee: Secondary | ICD-10-CM | POA: Diagnosis not present

## 2019-10-13 NOTE — Therapy (Signed)
[facility] [place], [place], [address] Phone: [phone]   Fax:  [phone]  Physical Therapy Treatment  Patient Details  Name: Jean Hernandez MRN: [id number] Date of Birth: 1971-09-26 Referring Provider (PT): Jean Arabian MD   Encounter Date: 10/13/2019  PT End of Session - 10/13/19 1208    Visit Number  5    Number of Visits  12    Date for PT Re-Evaluation  10/30/19    Authorization Type  FOTO AT LEAST EVERY 5TH VISIT.  PROGRESS NOTE AT 10TH VISIT.    PT Start Time  1030    PT Stop Time  1118    PT Time Calculation (min)  48 min    Activity Tolerance  Patient tolerated treatment well    Behavior During Therapy  WFL for tasks assessed/performed       Past Medical History:  Diagnosis Date  . Anxiety   . Asthma    Allergy induced  . GERD (gastroesophageal reflux disease)   . Hypothyroidism   . Rheumatoid arthritis ([place])   . Sleep apnea    mild no mask    Past Surgical History:  Procedure Laterality Date  . ABDOMINAL HYSTERECTOMY     many surgeries for this  . C sections     x2  . colonsocopy  2012  . TOTAL KNEE ARTHROPLASTY Left 09/28/2019   Procedure: TOTAL KNEE ARTHROPLASTY;  Surgeon: Jean Arabian, MD;  Location: [facility];  Service: Orthopedics;  Laterality: Left;  6min    There were no vitals filed for this visit.                    [facility] Adult PT Treatment/Exercise - 10/13/19 0001      Exercises   Exercises  Knee/Hip      Knee/Hip Exercises: Aerobic   Recumbent Bike  Level 1 x 10 minutes at seat 6.      Knee/Hip Exercises: Prone   Other Prone Exercises  Prone hang x 5 minutes while receiving STW/M (5 minutes to patient's distal hamstrings.      Modalities   Modalities  Vasopneumatic      Vasopneumatic   Number Minutes Vasopneumatic   15 minutes    Vasopnuematic Location   --   Left knee.   Vasopneumatic Pressure  Low      Manual Therapy   Manual Therapy  Passive  ROM    Passive ROM  Gentle right knee overpressure stretch x 2 minutes.                  PT Long Term Goals - 10/02/19 1323      PT LONG TERM GOAL #1   Title  Independent with a HEP.    Time  4    Period  Weeks    Status  New      PT LONG TERM GOAL #2   Title  Full left active knee extension in order to normalize gait.    Time  4    Period  Weeks    Status  New      PT LONG TERM GOAL #3   Title  Active knee flexion to 120 degrees+ so the patient can perform functional tasks and do so with pain not > 2-3/10.    Time  4    Period  Weeks    Status  New      PT LONG TERM GOAL #4   Title  Increase left knee strength to a solid 4+/5 to provide good stability for accomplishment of functional activities.    Time  4    Period  Weeks    Status  New      PT LONG TERM GOAL #5   Title  Perform a reciprocating stair gait with one railing with pain not > 2-3/10.    Time  4    Period  Weeks    Status  New            Plan - 10/13/19 1121    Clinical Impression Statement  Patient did well today though she did not take pain med prior to treatment.  She was able to make revolutions on bike today.  Provided her with an additional right knee extension stretch.  Cued patient on heel strking during her gait cycle.       Patient will benefit from skilled therapeutic intervention in order to improve the following deficits and impairments:     Visit Diagnosis: Chronic pain of left knee  Stiffness of left knee, not elsewhere classified  Muscle weakness (generalized)     Problem List Patient Active Problem List   Diagnosis Date Noted  . OA (osteoarthritis) of knee 09/28/2019  . Rheumatoid arthritis involving multiple sites (HCC) 07/19/2017  . Upper airway cough syndrome 07/18/2017  . Hyperlipidemia 08/06/2008  . ANAL FISSURE 08/06/2008  . RECTAL BLEEDING 08/06/2008  . SKIN TAG 08/06/2008    Jean Hernandez, Jean Hernandez MPT 10/13/2019, 12:10 PM  [facility] [address] [place], [place], [address] Phone: [phone]   Fax:  [phone]  Name: Jean Hernandez MRN: [id number] Date of Birth: 10-11-1971

## 2019-10-15 ENCOUNTER — Ambulatory Visit: Payer: Federal, State, Local not specified - PPO | Admitting: Physical Therapy

## 2019-10-15 ENCOUNTER — Other Ambulatory Visit: Payer: Self-pay

## 2019-10-15 DIAGNOSIS — M6281 Muscle weakness (generalized): Secondary | ICD-10-CM | POA: Diagnosis not present

## 2019-10-15 DIAGNOSIS — M25662 Stiffness of left knee, not elsewhere classified: Secondary | ICD-10-CM

## 2019-10-15 DIAGNOSIS — G8929 Other chronic pain: Secondary | ICD-10-CM | POA: Diagnosis not present

## 2019-10-15 DIAGNOSIS — M25562 Pain in left knee: Secondary | ICD-10-CM | POA: Diagnosis not present

## 2019-10-15 NOTE — Therapy (Signed)
[facility] [place], [place], [address] Phone: [phone]   Fax:  [phone]  Physical Therapy Treatment  Patient Details  Name: Jean Hernandez MRN: [id number] Date of Birth: [birth year] Referring Provider (PT): Gaynelle Arabian MD   Encounter Date: 10/15/2019  PT End of Session - 10/15/19 1150    Visit Number  6    Number of Visits  12    Date for PT Re-Evaluation  10/30/19    Authorization Type  FOTO AT LEAST EVERY 5TH VISIT.  PROGRESS NOTE AT 10TH VISIT.    PT Start Time  1032    PT Stop Time  1123    PT Time Calculation (min)  51 min    Activity Tolerance  Patient tolerated treatment well    Behavior During Therapy  WFL for tasks assessed/performed       Past Medical History:  Diagnosis Date  . Anxiety   . Asthma    Allergy induced  . GERD (gastroesophageal reflux disease)   . Hypothyroidism   . Rheumatoid arthritis ([place])   . Sleep apnea    mild no mask    Past Surgical History:  Procedure Laterality Date  . ABDOMINAL HYSTERECTOMY     many surgeries for this  . C sections     x2  . colonsocopy  2012  . TOTAL KNEE ARTHROPLASTY Left 09/28/2019   Procedure: TOTAL KNEE ARTHROPLASTY;  Surgeon: Gaynelle Arabian, MD;  Location: [facility];  Service: Orthopedics;  Laterality: Left;  29min    There were no vitals filed for this visit.  Subjective Assessment - 10/15/19 1116    Subjective  COVID-19 screen performed prior to patient entering clinic.  Ordered weights for stretch.  Been using a knee glider at home.    Pertinent History  RA, GERD, Hypothyroidism.    How long can you walk comfortably?  Short distances around house.    Patient Stated Goals  Perform ADL's without pain.    Currently in Pain?  Yes    Pain Score  5     Pain Location  Leg    Pain Orientation  Left    Pain Descriptors / Indicators  Aching;Throbbing    Pain Type  Surgical pain    Pain Onset  More than a month ago         [facility] PT  Assessment - 10/15/19 0001      AROM   Overall AROM Comments  120 degrees of left knee flexion.                   [facility] Adult PT Treatment/Exercise - 10/15/19 0001      Exercises   Exercises  Knee/Hip      Knee/Hip Exercises: Aerobic   Recumbent Bike  Level 1 x 7 minutes.    Nustep  Level 3 x 8 minutes.      Knee/Hip Exercises: Standing   Other Standing Knee Exercises  Rockerboard x 4 minutes.      Modalities   Modalities  Vasopneumatic      Vasopneumatic   Number Minutes Vasopneumatic   15 minutes    Vasopnuematic Location   --   Left knee.   Vasopneumatic Pressure  Low      Manual Therapy   Manual Therapy  Passive ROM    Passive ROM  PROM x 5 minutes into left knee extension.  PT Long Term Goals - 10/02/19 1323      PT LONG TERM GOAL #1   Title  Independent with a HEP.    Time  4    Period  Weeks    Status  New      PT LONG TERM GOAL #2   Title  Full left active knee extension in order to normalize gait.    Time  4    Period  Weeks    Status  New      PT LONG TERM GOAL #3   Title  Active knee flexion to 120 degrees+ so the patient can perform functional tasks and do so with pain not > 2-3/10.    Time  4    Period  Weeks    Status  New      PT LONG TERM GOAL #4   Title  Increase left knee strength to a solid 4+/5 to provide good stability for accomplishment of functional activities.    Time  4    Period  Weeks    Status  New      PT LONG TERM GOAL #5   Title  Perform a reciprocating stair gait with one railing with pain not > 2-3/10.    Time  4    Period  Weeks    Status  New            Plan - 10/15/19 1120    Clinical Impression Statement  Patient doing well with active left knee flexion to 120 degrees.  Needs continues work on extension.    Personal Factors and Comorbidities  Comorbidity 1;Comorbidity 2    Comorbidities  RA, GERD, Hypothyroidism.    Examination-Activity Limitations  Locomotion  Level;Other;Stairs    Examination-Participation Restrictions  Other;Community Activity    Stability/Clinical Decision Making  Stable/Uncomplicated    Rehab Potential  Excellent    PT Frequency  3x / week    PT Duration  4 weeks    PT Treatment/Interventions  ADLs/Self Care Home Management;Cryotherapy;Electrical Stimulation;Gait training;Moist Heat;Stair training;Functional mobility training;Therapeutic activities;Therapeutic exercise;Neuromuscular re-education;Manual techniques;Patient/family education;Vasopneumatic Device    PT Next Visit Plan  Nustep with progression to stationary bike, ROM.  Progress into TKA protocol.  Vasopnuematic.    Consulted and Agree with Plan of Care  Patient       Patient will benefit from skilled therapeutic intervention in order to improve the following deficits and impairments:  Abnormal gait, Difficulty walking, Decreased activity tolerance, Decreased strength, Increased edema, Decreased range of motion, Pain  Visit Diagnosis: Chronic pain of left knee  Stiffness of left knee, not elsewhere classified  Muscle weakness (generalized)     Problem List Patient Active Problem List   Diagnosis Date Noted  . OA (osteoarthritis) of knee 09/28/2019  . Rheumatoid arthritis involving multiple sites (HCC) 07/19/2017  . Upper airway cough syndrome 07/18/2017  . Hyperlipidemia 08/06/2008  . ANAL FISSURE 08/06/2008  . RECTAL BLEEDING 08/06/2008  . SKIN TAG 08/06/2008    Torry Istre, [place] MPT 10/15/2019, 11:51 AM  [facility] [address] [place], [place], [address] Phone: [phone]   Fax:  [phone]  Name: Jean Hernandez MRN: [id number] Date of Birth: 09-17-1971

## 2019-10-20 ENCOUNTER — Other Ambulatory Visit: Payer: Self-pay

## 2019-10-20 ENCOUNTER — Ambulatory Visit: Payer: Federal, State, Local not specified - PPO | Attending: Orthopedic Surgery | Admitting: Physical Therapy

## 2019-10-20 DIAGNOSIS — M6281 Muscle weakness (generalized): Secondary | ICD-10-CM | POA: Diagnosis not present

## 2019-10-20 DIAGNOSIS — M25562 Pain in left knee: Secondary | ICD-10-CM | POA: Diagnosis not present

## 2019-10-20 DIAGNOSIS — M25662 Stiffness of left knee, not elsewhere classified: Secondary | ICD-10-CM | POA: Insufficient documentation

## 2019-10-20 DIAGNOSIS — G8929 Other chronic pain: Secondary | ICD-10-CM | POA: Diagnosis not present

## 2019-10-20 NOTE — Therapy (Signed)
[facility] [place], [place], [address] Phone: [phone]   Fax:  [phone]  Physical Therapy Treatment  Patient Details  Name: Jean Hernandez MRN: [id number] Date of Birth: 05-Oct-1971 Referring Provider (PT): Gaynelle Arabian MD   Encounter Date: 10/20/2019  PT End of Session - 10/20/19 1741    Visit Number  7    Number of Visits  12    Date for PT Re-Evaluation  10/30/19    Authorization Type  FOTO AT LEAST EVERY 5TH VISIT.  PROGRESS NOTE AT 10TH VISIT.    PT Start Time  0445    PT Stop Time  0534    PT Time Calculation (min)  49 min    Activity Tolerance  Patient tolerated treatment well    Behavior During Therapy  [facility] for tasks assessed/performed       Past Medical History:  Diagnosis Date  . Anxiety   . Asthma    Allergy induced  . GERD (gastroesophageal reflux disease)   . Hypothyroidism   . Rheumatoid arthritis ([place])   . Sleep apnea    mild no mask    Past Surgical History:  Procedure Laterality Date  . ABDOMINAL HYSTERECTOMY     many surgeries for this  . C sections     x2  . colonsocopy  2012  . TOTAL KNEE ARTHROPLASTY Left 09/28/2019   Procedure: TOTAL KNEE ARTHROPLASTY;  Surgeon: Gaynelle Arabian, MD;  Location: [facility];  Service: Orthopedics;  Laterality: Left;  5min    There were no vitals filed for this visit.  Subjective Assessment - 10/20/19 1736    Subjective  COVID-19 screen performed prior to patient entering clinic.  My knee got red the last couple of days and then had some drainage today.  Called doctor and she was put on an antibiotic.  I'm going to see the doctor this week.    Pertinent History  RA, GERD, Hypothyroidism.    How long can you walk comfortably?  Short distances around house.    Patient Stated Goals  Perform ADL's without pain.    Currently in Pain?  Yes    Pain Location  Knee    Pain Orientation  Left    Pain Descriptors / Indicators  Aching;Throbbing    Pain Type   Surgical pain    Pain Onset  More than a month ago         [facility] PT Assessment - 10/20/19 0001      AROM   Overall AROM Comments  125 degrees of active left knee flexion and passive is 130 degrees.                   [facility] Adult PT Treatment/Exercise - 10/20/19 0001      Exercises   Exercises  Knee/Hip      Knee/Hip Exercises: Aerobic   Recumbent Bike  Level 2 x 10 minutes progressing to seat 4 today.    Nustep  Level 3 x 5 minutes.      Knee/Hip Exercises: Machines for Strengthening   Cybex Knee Extension  10# x 3 minutes.    Cybex Knee Flexion  30# x 3 minutes.      Modalities   Modalities  Vasopneumatic      Vasopneumatic   Number Minutes Vasopneumatic   13 minutes    Vasopnuematic Location   --   Left knee.   Vasopneumatic Pressure  Low      Manual  Therapy   Manual Therapy  Passive ROM    Passive ROM  PROM to left knee with focus on extension x 5 minutes.                  PT Long Term Goals - 10/02/19 1323      PT LONG TERM GOAL #1   Title  Independent with a HEP.    Time  4    Period  Weeks    Status  New      PT LONG TERM GOAL #2   Title  Full left active knee extension in order to normalize gait.    Time  4    Period  Weeks    Status  New      PT LONG TERM GOAL #3   Title  Active knee flexion to 120 degrees+ so the patient can perform functional tasks and do so with pain not > 2-3/10.    Time  4    Period  Weeks    Status  New      PT LONG TERM GOAL #4   Title  Increase left knee strength to a solid 4+/5 to provide good stability for accomplishment of functional activities.    Time  4    Period  Weeks    Status  New      PT LONG TERM GOAL #5   Title  Perform a reciprocating stair gait with one railing with pain not > 2-3/10.    Time  4    Period  Weeks    Status  New            Plan - 10/20/19 1742    Clinical Impression Statement  The patient did great today.  Her active flexion was 125 degrees today and 130  degrees passive.  She still needs work on extension.  She has been doing multiple extension stretches at home.    Personal Factors and Comorbidities  Comorbidity 1;Comorbidity 2    Comorbidities  RA, GERD, Hypothyroidism.    Examination-Activity Limitations  Locomotion Level;Other;Stairs    Examination-Participation Restrictions  Other;Community Activity    Stability/Clinical Decision Making  Stable/Uncomplicated    Rehab Potential  Excellent    PT Frequency  3x / week    PT Duration  4 weeks    PT Treatment/Interventions  ADLs/Self Care Home Management;Cryotherapy;Electrical Stimulation;Gait training;Moist Heat;Stair training;Functional mobility training;Therapeutic activities;Therapeutic exercise;Neuromuscular re-education;Manual techniques;Patient/family education;Vasopneumatic Device    PT Next Visit Plan  Nustep with progression to stationary bike, ROM.  Progress into TKA protocol.  Vasopnuematic.    Consulted and Agree with Plan of Care  Patient       Patient will benefit from skilled therapeutic intervention in order to improve the following deficits and impairments:  Abnormal gait, Difficulty walking, Decreased activity tolerance, Decreased strength, Increased edema, Decreased range of motion, Pain  Visit Diagnosis: Chronic pain of left knee  Stiffness of left knee, not elsewhere classified  Muscle weakness (generalized)     Problem List Patient Active Problem List   Diagnosis Date Noted  . OA (osteoarthritis) of knee 09/28/2019  . Rheumatoid arthritis involving multiple sites (HCC) 07/19/2017  . Upper airway cough syndrome 07/18/2017  . Hyperlipidemia 08/06/2008  . ANAL FISSURE 08/06/2008  . RECTAL BLEEDING 08/06/2008  . SKIN TAG 08/06/2008    Maxwell Martorano, [place] MPT 10/20/2019, 5:46 PM  [facility] [address] [place], [place], [address] Phone: [phone]   Fax:  [phone]  Name: TAVI GAUGHRAN MRN: [id number] Date of  Birth: 12-12-1970

## 2019-10-22 ENCOUNTER — Encounter: Payer: Self-pay | Admitting: Physical Therapy

## 2019-10-22 ENCOUNTER — Other Ambulatory Visit: Payer: Self-pay

## 2019-10-22 ENCOUNTER — Ambulatory Visit: Payer: Federal, State, Local not specified - PPO | Admitting: Physical Therapy

## 2019-10-22 DIAGNOSIS — M25662 Stiffness of left knee, not elsewhere classified: Secondary | ICD-10-CM

## 2019-10-22 DIAGNOSIS — M25562 Pain in left knee: Secondary | ICD-10-CM | POA: Diagnosis not present

## 2019-10-22 DIAGNOSIS — G8929 Other chronic pain: Secondary | ICD-10-CM

## 2019-10-22 DIAGNOSIS — M6281 Muscle weakness (generalized): Secondary | ICD-10-CM

## 2019-10-22 NOTE — Therapy (Signed)
[facility] [address] [place], [place], [address] Phone: [phone]   Fax:  [phone]  Physical Therapy Treatment  Patient Details  Name: Jean Hernandez MRN: [id number] Date of Birth: Apr 26, 1971 Referring Provider (PT): Ollen Gross MD   Encounter Date: 10/22/2019  PT End of Session - 10/22/19 1739    Visit Number  8    Number of Visits  12    Date for PT Re-Evaluation  10/30/19    Authorization Type  FOTO AT LEAST EVERY 5TH VISIT.  PROGRESS NOTE AT 10TH VISIT.    PT Start Time  0445    PT Stop Time  0537    PT Time Calculation (min)  52 min    Activity Tolerance  Patient tolerated treatment well    Behavior During Therapy  [facility] for tasks assessed/performed       Past Medical History:  Diagnosis Date  . Anxiety   . Asthma    Allergy induced  . GERD (gastroesophageal reflux disease)   . Hypothyroidism   . Rheumatoid arthritis (HCC)   . Sleep apnea    mild no mask    Past Surgical History:  Procedure Laterality Date  . ABDOMINAL HYSTERECTOMY     many surgeries for this  . C sections     x2  . colonsocopy  2012  . TOTAL KNEE ARTHROPLASTY Left 09/28/2019   Procedure: TOTAL KNEE ARTHROPLASTY;  Surgeon: Ollen Gross, MD;  Location: [facility];  Service: Orthopedics;  Laterality: Left;     There were no vitals filed for this visit.  Subjective Assessment - 10/22/19 1654    Subjective  COVID-19 screen performed prior to patient entering clinic.  Went to doctor.  he said the infection is due to a stitch allergy.    Pertinent History  RA, GERD, Hypothyroidism.    How long can you walk comfortably?  Short distances around house.    Patient Stated Goals  Perform ADL's without pain.    Currently in Pain?  Yes    Pain Score  5     Pain Location  Knee    Pain Orientation  Left    Pain Descriptors / Indicators  Aching;Throbbing    Pain Onset  More than a month ago                       [facility] Adult PT  Treatment/Exercise - 10/22/19 0001      Exercises   Exercises  Knee/Hip      Knee/Hip Exercises: Aerobic   Recumbent Bike  Level 2 progressing to seat 4 x 10 minutes.    Nustep  level 3 x 5 minutes.      Knee/Hip Exercises: Machines for Strengthening   Cybex Knee Extension  10# x 3 minutes    Cybex Knee Flexion  30# x 3 minutes.    Cybex Leg Press  2 plates x 3 minutes with patient also focusing on extension.      Modalities   Modalities  Vasopneumatic      Vasopneumatic   Number Minutes Vasopneumatic   15 minutes    Vasopnuematic Location   --   Left knee.   Vasopneumatic Pressure  Low      Manual Therapy   Manual Therapy  Passive ROM    Passive ROM  3 minute left knee extension stretching.  PT Long Term Goals - 10/02/19 1323      PT LONG TERM GOAL #1   Title  Independent with a HEP.    Time  4    Period  Weeks    Status  New      PT LONG TERM GOAL #2   Title  Full left active knee extension in order to normalize gait.    Time  4    Period  Weeks    Status  New      PT LONG TERM GOAL #3   Title  Active knee flexion to 120 degrees+ so the patient can perform functional tasks and do so with pain not > 2-3/10.    Time  4    Period  Weeks    Status  New      PT LONG TERM GOAL #4   Title  Increase left knee strength to a solid 4+/5 to provide good stability for accomplishment of functional activities.    Time  4    Period  Weeks    Status  New      PT LONG TERM GOAL #5   Title  Perform a reciprocating stair gait with one railing with pain not > 2-3/10.    Time  4    Period  Weeks    Status  New            Plan - 10/22/19 1740    Clinical Impression Statement  Patient did great with the addition of the leg press today.  She states her knee is looking better since receiving antibiotics.  She had her affected incsional area covered with sterile bandaids.    Personal Factors and Comorbidities  Comorbidity 1;Comorbidity 2     Comorbidities  RA, GERD, Hypothyroidism.    Examination-Activity Limitations  Locomotion Level;Other;Stairs    Stability/Clinical Decision Making  Stable/Uncomplicated    Rehab Potential  Excellent    PT Frequency  3x / week    PT Duration  4 weeks    PT Treatment/Interventions  ADLs/Self Care Home Management;Cryotherapy;Electrical Stimulation;Gait training;Moist Heat;Stair training;Functional mobility training;Therapeutic activities;Therapeutic exercise;Neuromuscular re-education;Manual techniques;Patient/family education;Vasopneumatic Device    PT Next Visit Plan  Nustep with progression to stationary bike, ROM.  Progress into TKA protocol.  Vasopnuematic.    Consulted and Agree with Plan of Care  Patient       Patient will benefit from skilled therapeutic intervention in order to improve the following deficits and impairments:  Abnormal gait, Difficulty walking, Decreased activity tolerance, Decreased strength, Increased edema, Decreased range of motion, Pain  Visit Diagnosis: Chronic pain of left knee  Stiffness of left knee, not elsewhere classified  Muscle weakness (generalized)     Problem List Patient Active Problem List   Diagnosis Date Noted  . OA (osteoarthritis) of knee 09/28/2019  . Rheumatoid arthritis involving multiple sites ([place]) 07/19/2017  . Upper airway cough syndrome 07/18/2017  . Hyperlipidemia 08/06/2008  . ANAL FISSURE 08/06/2008  . RECTAL BLEEDING 08/06/2008  . SKIN TAG 08/06/2008    Aaima Gaddie, [place] MPT 10/22/2019, 5:50 PM  [facility] [place], [place], [address] Phone: [phone]   Fax:  [phone]  Name: Jean Hernandez MRN: [id number] Date of Birth: 14-May-1971

## 2019-10-23 ENCOUNTER — Ambulatory Visit: Payer: Federal, State, Local not specified - PPO | Admitting: Physical Therapy

## 2019-10-27 ENCOUNTER — Ambulatory Visit: Payer: Federal, State, Local not specified - PPO | Admitting: Physical Therapy

## 2019-10-27 ENCOUNTER — Other Ambulatory Visit: Payer: Self-pay

## 2019-10-27 DIAGNOSIS — G8929 Other chronic pain: Secondary | ICD-10-CM

## 2019-10-27 DIAGNOSIS — M0579 Rheumatoid arthritis with rheumatoid factor of multiple sites without organ or systems involvement: Secondary | ICD-10-CM | POA: Diagnosis not present

## 2019-10-27 DIAGNOSIS — M6281 Muscle weakness (generalized): Secondary | ICD-10-CM | POA: Diagnosis not present

## 2019-10-27 DIAGNOSIS — M25562 Pain in left knee: Secondary | ICD-10-CM | POA: Diagnosis not present

## 2019-10-27 DIAGNOSIS — M25662 Stiffness of left knee, not elsewhere classified: Secondary | ICD-10-CM | POA: Diagnosis not present

## 2019-10-27 NOTE — Therapy (Signed)
[facility] [address] [place], [place], [address] Phone: [phone]   Fax:  [phone]  Physical Therapy Treatment  Patient Details  Name: Jean Hernandez MRN: [id number] Date of Birth: 07/16/1971 Referring Provider (PT): Ollen Gross MD   Encounter Date: 10/27/2019  PT End of Session - 10/27/19 1556    Visit Number  9    Number of Visits  12    Date for PT Re-Evaluation  10/30/19    Authorization Type  FOTO AT LEAST EVERY 5TH VISIT.  PROGRESS NOTE AT 10TH VISIT.    PT Start Time  0230    PT Stop Time  0321    PT Time Calculation (min)  51 min    Activity Tolerance  Patient tolerated treatment well    Behavior During Therapy  WFL for tasks assessed/performed       Past Medical History:  Diagnosis Date  . Anxiety   . Asthma    Allergy induced  . GERD (gastroesophageal reflux disease)   . Hypothyroidism   . Rheumatoid arthritis (HCC)   . Sleep apnea    mild no mask    Past Surgical History:  Procedure Laterality Date  . ABDOMINAL HYSTERECTOMY     many surgeries for this  . C sections     x2  . colonsocopy  2012  . TOTAL KNEE ARTHROPLASTY Left 09/28/2019   Procedure: TOTAL KNEE ARTHROPLASTY;  Surgeon: Ollen Gross, MD;  Location: [facility];  Service: Orthopedics;  Laterality: Left;     There were no vitals filed for this visit.  Subjective Assessment - 10/27/19 1553    Subjective  COVID-19 screen performed prior to patient entering clinic.  Doing good.    Pertinent History  RA, GERD, Hypothyroidism.                       [facility] Adult PT Treatment/Exercise - 10/27/19 0001      Exercises   Exercises  Knee/Hip      Knee/Hip Exercises: Aerobic   Recumbent Bike  Level 2 x 10 minutes progressing forward to seat 2.    Nustep  Level 5 x 5 minutes.      Knee/Hip Exercises: Supine   Short Arc Quad Sets Limitations  16 minutes facilitated with [place] E'stim (10 sec extension holds f/b 10 sec  rest).      Modalities   Modalities  Vasopneumatic      Vasopneumatic   Number Minutes Vasopneumatic   15 minutes    Vasopnuematic Location   --   Left knee.   Vasopneumatic Pressure  Low                  PT Long Term Goals - 10/02/19 1323      PT LONG TERM GOAL #1   Title  Independent with a HEP.    Time  4    Period  Weeks    Status  New      PT LONG TERM GOAL #2   Title  Full left active knee extension in order to normalize gait.    Time  4    Period  Weeks    Status  New      PT LONG TERM GOAL #3   Title  Active knee flexion to 120 degrees+ so the patient can perform functional tasks and do so with pain not > 2-3/10.    Time  4    Period  Weeks    Status  New      PT LONG TERM GOAL #4   Title  Increase left knee strength to a solid 4+/5 to provide good stability for accomplishment of functional activities.    Time  4    Period  Weeks    Status  New      PT LONG TERM GOAL #5   Title  Perform a reciprocating stair gait with one railing with pain not > 2-3/10.    Time  4    Period  Weeks    Status  New              Patient will benefit from skilled therapeutic intervention in order to improve the following deficits and impairments:     Visit Diagnosis: Chronic pain of left knee  Stiffness of left knee, not elsewhere classified  Muscle weakness (generalized)     Problem List Patient Active Problem List   Diagnosis Date Noted  . OA (osteoarthritis) of knee 09/28/2019  . Rheumatoid arthritis involving multiple sites ([place]) 07/19/2017  . Upper airway cough syndrome 07/18/2017  . Hyperlipidemia 08/06/2008  . ANAL FISSURE 08/06/2008  . RECTAL BLEEDING 08/06/2008  . SKIN TAG 08/06/2008    Swati Granberry, [place] MPT 10/27/2019, 3:58 PM  [facility] [place], [place], [address] Phone: [phone]   Fax:  [phone]  Name: Jean Hernandez MRN: [id number] Date of Birth:  May 23, 1971

## 2019-10-29 ENCOUNTER — Encounter: Payer: Self-pay | Admitting: Physical Therapy

## 2019-10-29 ENCOUNTER — Other Ambulatory Visit: Payer: Self-pay

## 2019-10-29 ENCOUNTER — Ambulatory Visit: Payer: Federal, State, Local not specified - PPO | Admitting: Physical Therapy

## 2019-10-29 DIAGNOSIS — M0579 Rheumatoid arthritis with rheumatoid factor of multiple sites without organ or systems involvement: Secondary | ICD-10-CM | POA: Diagnosis not present

## 2019-10-29 DIAGNOSIS — M2550 Pain in unspecified joint: Secondary | ICD-10-CM | POA: Diagnosis not present

## 2019-10-29 DIAGNOSIS — G8929 Other chronic pain: Secondary | ICD-10-CM | POA: Diagnosis not present

## 2019-10-29 DIAGNOSIS — M6281 Muscle weakness (generalized): Secondary | ICD-10-CM

## 2019-10-29 DIAGNOSIS — Z79899 Other long term (current) drug therapy: Secondary | ICD-10-CM | POA: Diagnosis not present

## 2019-10-29 DIAGNOSIS — M170 Bilateral primary osteoarthritis of knee: Secondary | ICD-10-CM | POA: Diagnosis not present

## 2019-10-29 DIAGNOSIS — M25662 Stiffness of left knee, not elsewhere classified: Secondary | ICD-10-CM | POA: Diagnosis not present

## 2019-10-29 DIAGNOSIS — M25562 Pain in left knee: Secondary | ICD-10-CM | POA: Diagnosis not present

## 2019-10-29 NOTE — Therapy (Signed)
[facility] [place], [place], [address] Phone: [phone]   Fax:  [phone]  Physical Therapy Treatment  Patient Details  Name: Jean Hernandez MRN: [id number] Date of Birth: 03/02/1971 Referring Provider (PT): Gaynelle Arabian MD   Encounter Date: 10/29/2019  PT End of Session - 10/29/19 2671    Visit Number  10    Number of Visits  12    Date for PT Re-Evaluation  10/30/19    Authorization Type  FOTO AT LEAST EVERY 5TH VISIT.  PROGRESS NOTE AT 10TH VISIT.    Equipment Utilized During Treatment  Gait belt    Activity Tolerance  Patient tolerated treatment well    Behavior During Therapy  WFL for tasks assessed/performed       Past Medical History:  Diagnosis Date  . Anxiety   . Asthma    Allergy induced  . GERD (gastroesophageal reflux disease)   . Hypothyroidism   . Rheumatoid arthritis ([place])   . Sleep apnea    mild no mask    Past Surgical History:  Procedure Laterality Date  . ABDOMINAL HYSTERECTOMY     many surgeries for this  . C sections     x2  . colonsocopy  2012  . TOTAL KNEE ARTHROPLASTY Left 09/28/2019   Procedure: TOTAL KNEE ARTHROPLASTY;  Surgeon: Gaynelle Arabian, MD;  Location: [facility];  Service: Orthopedics;  Laterality: Left;  10min    There were no vitals filed for this visit.  Subjective Assessment - 10/29/19 1737    Subjective  COVID-19 screen performed prior to patient entering clinic.  Had two more stitches taken out this week and been on a new antibiotic.  Knee looks much better.  Tired today.    Pertinent History  RA, GERD, Hypothyroidism.    How long can you walk comfortably?  Short distances around house.    Patient Stated Goals  Perform ADL's without pain.    Currently in Pain?  Yes    Pain Score  5     Pain Location  Knee    Pain Orientation  Left    Pain Descriptors / Indicators  Aching;Throbbing    Pain Type  Surgical pain    Pain Onset  More than a month ago          [facility] PT Assessment - 10/29/19 0001      AROM   Overall AROM Comments  130 degrees of active left knee flexion.                   [facility] Adult PT Treatment/Exercise - 10/29/19 0001      Exercises   Exercises  Knee/Hip      Knee/Hip Exercises: Aerobic   Recumbent Bike  Level 2 x 8 minutes.    Nustep  Level 1 x 7 minutes.      Modalities   Modalities  Vasopneumatic      Vasopneumatic   Number Minutes Vasopneumatic   14 minutes    Vasopnuematic Location   --   Left knee.   Vasopneumatic Pressure  Low      Manual Therapy   Manual Therapy  Passive ROM    Passive ROM  Sustained left knee overpressure stretch x 4 minutes while receiving STW/M (4 minutes) to hamstrings.                  PT Long Term Goals - 10/29/19 1744      PT LONG TERM  GOAL #1   Title  Independent with a HEP.    Time  4    Period  Weeks    Status  New      PT LONG TERM GOAL #2   Title  Full left active knee extension in order to normalize gait.    Time  4    Period  Weeks    Status  On-going      PT LONG TERM GOAL #3   Title  Active knee flexion to 120 degrees+ so the patient can perform functional tasks and do so with pain not > 2-3/10.    Baseline  130 degrees (10/29/19).    Time  4    Period  Weeks    Status  Achieved            Plan - 10/29/19 1747    Clinical Impression Statement  Patient achieved active left knee flexion to 130 degrees.  She still needs work on extension.    Personal Factors and Comorbidities  Comorbidity 1;Comorbidity 2    Comorbidities  RA, GERD, Hypothyroidism.    Examination-Activity Limitations  Locomotion Level;Other;Stairs    Examination-Participation Restrictions  Other;Community Activity    Stability/Clinical Decision Making  Stable/Uncomplicated    Rehab Potential  Excellent    PT Frequency  3x / week    PT Duration  4 weeks    PT Treatment/Interventions  ADLs/Self Care Home Management;Cryotherapy;Electrical Stimulation;Gait  training;Moist Heat;Stair training;Functional mobility training;Therapeutic activities;Therapeutic exercise;Neuromuscular re-education;Manual techniques;Patient/family education;Vasopneumatic Device    PT Next Visit Plan  Nustep with progression to stationary bike, ROM.  Progress into TKA protocol.  Vasopnuematic.    Consulted and Agree with Plan of Care  Patient       Patient will benefit from skilled therapeutic intervention in order to improve the following deficits and impairments:  Abnormal gait, Difficulty walking, Decreased activity tolerance, Decreased strength, Increased edema, Decreased range of motion, Pain  Visit Diagnosis: Chronic pain of left knee  Stiffness of left knee, not elsewhere classified  Muscle weakness (generalized)     Problem List Patient Active Problem List   Diagnosis Date Noted  . OA (osteoarthritis) of knee 09/28/2019  . Rheumatoid arthritis involving multiple sites (HCC) 07/19/2017  . Upper airway cough syndrome 07/18/2017  . Hyperlipidemia 08/06/2008  . ANAL FISSURE 08/06/2008  . RECTAL BLEEDING 08/06/2008  . SKIN TAG 08/06/2008    Progress Note Reporting Period 10/02/19 to 10/29/19  See note below for Objective Data and Assessment of Progress/Goals. Excellent progress with active left knee flexion to 130 degrees.     Ori Trejos, [place]  MPT 10/29/2019, 5:53 PM  [facility] [address] [place], [place], [address] Phone: [phone]   Fax:  [phone]  Name: Jean Hernandez MRN: [id number] Date of Birth: 06-07-1971

## 2019-10-30 ENCOUNTER — Ambulatory Visit: Payer: Federal, State, Local not specified - PPO | Admitting: Physical Therapy

## 2019-10-30 ENCOUNTER — Encounter: Payer: Self-pay | Admitting: Physical Therapy

## 2019-10-30 DIAGNOSIS — M25662 Stiffness of left knee, not elsewhere classified: Secondary | ICD-10-CM

## 2019-10-30 DIAGNOSIS — M25562 Pain in left knee: Secondary | ICD-10-CM | POA: Diagnosis not present

## 2019-10-30 DIAGNOSIS — M6281 Muscle weakness (generalized): Secondary | ICD-10-CM | POA: Diagnosis not present

## 2019-10-30 DIAGNOSIS — G8929 Other chronic pain: Secondary | ICD-10-CM

## 2019-10-30 NOTE — Therapy (Addendum)
[facility] [place], [place], [address] Phone: [phone]   Fax:  [phone]  Physical Therapy Treatment  Patient Details  Name: Jean Hernandez MRN: [id number] Date of Birth: 11-Jul-1971 Referring Provider (PT): Gaynelle Arabian MD   Encounter Date: 10/30/2019  PT End of Session - 10/30/19 0747     Visit Number  11    Number of Visits  12    Date for PT Re-Evaluation  10/30/19    Authorization Type  FOTO AT LEAST EVERY 5TH VISIT.  PROGRESS NOTE AT 10TH VISIT.    PT Start Time  0735    PT Stop Time  0832    PT Time Calculation (min)  57 min    Activity Tolerance  Patient tolerated treatment well    Behavior During Therapy  WFL for tasks assessed/performed        Past Medical History:  Diagnosis Date   Anxiety    Asthma    Allergy induced   GERD (gastroesophageal reflux disease)    Hypothyroidism    Rheumatoid arthritis ([place])    Sleep apnea    mild no mask    Past Surgical History:  Procedure Laterality Date   ABDOMINAL HYSTERECTOMY     many surgeries for this   C sections     x2   colonsocopy  2012   TOTAL KNEE ARTHROPLASTY Left 09/28/2019   Procedure: TOTAL KNEE ARTHROPLASTY;  Surgeon: Gaynelle Arabian, MD;  Location: [facility];  Service: Orthopedics;  Laterality: Left;  21mn    There were no vitals filed for this visit.  Subjective Assessment - 10/30/19 0743     Subjective  COVID-19 screen performed prior to patient entering clinic. Can tolerate approximately 8-10 minutes of prone hang at home without ankle weights. Hasn't had any pain meds in 4 days and having difficulty sleeping.    Pertinent History  RA, GERD, Hypothyroidism.    How long can you walk comfortably?  Short distances around house.    Patient Stated Goals  Perform ADL's without pain.    Currently in Pain?  Yes    Pain Score  2     Pain Location  Knee    Pain Orientation  Left    Pain Descriptors / Indicators  Discomfort    Pain Type   Surgical pain    Pain Onset  More than a month ago    Pain Frequency  Constant          [facility] PT Assessment - 10/30/19 0001       Assessment   Medical Diagnosis  Left total knee replacement.    Referring Provider (PT)  FGaynelle ArabianMD    Onset Date/Surgical Date  09/28/19    Next MD Visit  11/03/2019      Restrictions   Weight Bearing Restrictions  No                    [facility] Adult PT Treatment/Exercise - 10/30/19 0001       Knee/Hip Exercises: Aerobic   Recumbent Bike  L3, seat 4 x11 min    Nustep  L5 x5 min      Knee/Hip Exercises: Machines for Strengthening   Cybex Knee Extension  10# 2x10 reps      Knee/Hip Exercises: Standing   Rocker Board  3 minutes      Knee/Hip Exercises: Seated   Long Arc Quad  Strengthening;Left;2 sets;10 reps    Long [employer]  Weight  4 lbs.    Long [employer] Limitations  hold at end rnage      Modalities   Modalities  Vasopneumatic;Electrical Presenter, broadcasting  [employer]    Electrical Stimulation Parameters  80-150 hz x15 min    Electrical Stimulation Goals  Pain;Edema      Vasopneumatic   Number Minutes Vasopneumatic   15 minutes    Vasopnuematic Location   Knee    Vasopneumatic Pressure  Low    Vasopneumatic Temperature   71      Manual Therapy   Manual Therapy  Passive ROM    Passive ROM  Passive HS stretch 3x30 sec, PROM of L knee into extension with holds at end range                   PT Long Term Goals - 10/29/19 1744       PT LONG TERM GOAL #1   Title  Independent with a HEP.    Time  4    Period  Weeks    Status  New      PT LONG TERM GOAL #2   Title  Full left active knee extension in order to normalize gait.    Time  4    Period  Weeks    Status  On-going      PT LONG TERM GOAL #3   Title  Active knee flexion to 120 degrees+ so the patient can perform functional tasks and do so with pain  not > 2-3/10.    Baseline  130 degrees (10/29/19).    Time  4    Period  Weeks    Status  Achieved      PT LONG TERM GOAL #4   Title  Increase left knee strength to a solid 4+/5 to provide good stability for accomplishment of functional activities.    Time  4    Period  Weeks    Status  On-going      PT LONG TERM GOAL #5   Title  Perform a reciprocating stair gait with one railing with pain not > 2-3/10.    Time  4    Period  Weeks    Status  On-going             Plan - 10/30/19 0910     Clinical Impression Statement  Patient presented in clinic with reports of low level L knee discomfort and able to tolerate knee extensor strengthening as well as more stretching. Patient sensitive to PROM into extension with holds at end range. More emphasis placed on holding at end range with DF to improve quad strength and activation. Normal modalities response noted following removal of the modalities.    Personal Factors and Comorbidities  Comorbidity 1;Comorbidity 2    Comorbidities  RA, GERD, Hypothyroidism.    Examination-Activity Limitations  Locomotion Level;Other;Stairs    Examination-Participation Restrictions  Other;Community Activity    Stability/Clinical Decision Making  Stable/Uncomplicated    Rehab Potential  Excellent    PT Frequency  3x / week    PT Duration  4 weeks    PT Treatment/Interventions  ADLs/Self Care Home Management;Cryotherapy;Electrical Stimulation;Gait training;Moist Heat;Stair training;Functional mobility training;Therapeutic activities;Therapeutic exercise;Neuromuscular re-education;Manual techniques;Patient/family education;Vasopneumatic Device    PT Next Visit Plan  Nustep with progression to stationary bike, ROM.  Progress into TKA protocol.  Vasopnuematic.  Consulted and Agree with Plan of Care  Patient        Patient will benefit from skilled therapeutic intervention in order to improve the following deficits and impairments:  Abnormal gait,  Difficulty walking, Decreased activity tolerance, Decreased strength, Increased edema, Decreased range of motion, Pain  Visit Diagnosis: Chronic pain of left knee  Stiffness of left knee, not elsewhere classified  Muscle weakness (generalized)     Problem List Patient Active Problem List   Diagnosis Date Noted   OA (osteoarthritis) of knee 09/28/2019   Rheumatoid arthritis involving multiple sites ([place]) 07/19/2017   Upper airway cough syndrome 07/18/2017   Hyperlipidemia 08/06/2008   ANAL FISSURE 08/06/2008   RECTAL BLEEDING 08/06/2008   SKIN TAG 08/06/2008    Standley Brooking, PTA 10/30/2019, 9:15 AM  [facility] [address] [place], [place], [address] Phone: [phone]   Fax:  [phone]  Name: KASHAY CAVENAUGH MRN: [id number] Date of Birth: [birth year]  PHYSICAL THERAPY DISCHARGE SUMMARY  Visits from Start of Care: 11.  Current functional level related to goals / functional outcomes: See above.   Remaining deficits: See goal section.   Education / Equipment: HEP.   Patient agrees to discharge. Patient goals were partially met. Patient is being discharged due to being pleased with the current functional level.    Mali Applegate MPT

## 2019-11-03 ENCOUNTER — Ambulatory Visit: Payer: Federal, State, Local not specified - PPO | Admitting: Physical Therapy

## 2019-11-03 DIAGNOSIS — Z96652 Presence of left artificial knee joint: Secondary | ICD-10-CM | POA: Diagnosis not present

## 2019-11-03 DIAGNOSIS — Z471 Aftercare following joint replacement surgery: Secondary | ICD-10-CM | POA: Diagnosis not present

## 2019-11-05 ENCOUNTER — Encounter: Payer: Federal, State, Local not specified - PPO | Admitting: Physical Therapy

## 2019-11-06 ENCOUNTER — Encounter: Payer: Federal, State, Local not specified - PPO | Admitting: Physical Therapy

## 2019-11-19 DIAGNOSIS — M1712 Unilateral primary osteoarthritis, left knee: Secondary | ICD-10-CM | POA: Diagnosis not present

## 2019-11-24 DIAGNOSIS — M0579 Rheumatoid arthritis with rheumatoid factor of multiple sites without organ or systems involvement: Secondary | ICD-10-CM | POA: Diagnosis not present

## 2019-12-17 ENCOUNTER — Ambulatory Visit: Payer: Federal, State, Local not specified - PPO | Attending: Internal Medicine

## 2019-12-17 DIAGNOSIS — Z23 Encounter for immunization: Secondary | ICD-10-CM | POA: Insufficient documentation

## 2019-12-17 NOTE — Progress Notes (Signed)
   Covid-19 Vaccination Clinic  Name:  Jean Hernandez    MRN: [id number] DOB: 1971/02/26  12/17/2019  Ms. Guess was observed post Covid-19 immunization for 15 minutes without incident. She was provided with Vaccine Information Sheet and instruction to access the V-Safe system.   Ms. Friscia was instructed to call 911 with any severe reactions post vaccine: Mar[place] Kitchen Difficulty breathing  . Swelling of face and throat  . A fast heartbeat  . A bad rash all over body  . Dizziness and weakness   Immunizations Administered    Name Date Dose VIS Date Route   Moderna COVID-19 Vaccine 12/17/2019 11:51 AM 0.5 mL 09/15/2019 Intramuscular   Manufacturer: Moderna   Lot: [id number]   NDC: 85631-497-02

## 2019-12-22 DIAGNOSIS — M0579 Rheumatoid arthritis with rheumatoid factor of multiple sites without organ or systems involvement: Secondary | ICD-10-CM | POA: Diagnosis not present

## 2020-01-20 ENCOUNTER — Ambulatory Visit: Payer: Federal, State, Local not specified - PPO | Attending: Internal Medicine

## 2020-01-20 DIAGNOSIS — Z23 Encounter for immunization: Secondary | ICD-10-CM

## 2020-01-20 NOTE — Progress Notes (Signed)
   Covid-19 Vaccination Clinic  Name:  Jean Hernandez    MRN: [id number] DOB: 18-Feb-1971  01/20/2020  [name] was observed post Covid-19 immunization for 15 minutes without incident. She was provided with Vaccine Information Sheet and instruction to access the V-Safe system.   [name] was instructed to call 911 with any severe reactions post vaccine: [place] Kitchen Difficulty breathing  . Swelling of face and throat  . A fast heartbeat  . A bad rash all over body  . Dizziness and weakness   Immunizations Administered    Name Date Dose VIS Date Route   Moderna COVID-19 Vaccine 01/20/2020  3:15 PM 0.5 mL 09/15/2019 Intramuscular   Manufacturer: Gala Murdoch   Lot: [id number]   NDC: 27035-009-38      Covid-19 Vaccination Clinic  Name:  Jean Hernandez    MRN: [id number] DOB: 1970-11-03  01/20/2020  [name] was observed post Covid-19 immunization for 15 minutes without incident. She was provided with Vaccine Information Sheet and instruction to access the V-Safe system.   [name] was instructed to call 911 with any severe reactions post vaccine: Mar[place] Kitchen Difficulty breathing  . Swelling of face and throat  . A fast heartbeat  . A bad rash all over body  . Dizziness and weakness   Immunizations Administered    Name Date Dose VIS Date Route   Moderna COVID-19 Vaccine 01/20/2020  3:15 PM 0.5 mL 09/15/2019 Intramuscular   Manufacturer: Gala Murdoch   Lot: [id number]   NDC: 01751-025-85

## 2020-01-21 DIAGNOSIS — M0579 Rheumatoid arthritis with rheumatoid factor of multiple sites without organ or systems involvement: Secondary | ICD-10-CM | POA: Diagnosis not present

## 2020-02-04 DIAGNOSIS — Z96652 Presence of left artificial knee joint: Secondary | ICD-10-CM | POA: Diagnosis not present

## 2020-02-04 DIAGNOSIS — Z471 Aftercare following joint replacement surgery: Secondary | ICD-10-CM | POA: Diagnosis not present

## 2020-02-08 ENCOUNTER — Encounter: Payer: Self-pay | Admitting: Family Medicine

## 2020-02-08 ENCOUNTER — Telehealth (INDEPENDENT_AMBULATORY_CARE_PROVIDER_SITE_OTHER): Payer: Federal, State, Local not specified - PPO | Admitting: Family Medicine

## 2020-02-08 DIAGNOSIS — J301 Allergic rhinitis due to pollen: Secondary | ICD-10-CM

## 2020-02-08 MED ORDER — CETIRIZINE HCL 10 MG PO TABS: 10.0000 mg | ORAL_TABLET | Freq: Every day | ORAL | 3 refills | Status: DC

## 2020-02-08 MED ORDER — PREDNISONE 10 MG PO TABS: ORAL_TABLET | ORAL | 0 refills | Status: DC

## 2020-02-08 MED ORDER — OLOPATADINE HCL 0.1 % OP SOLN: 1.0000 [drp] | Freq: Two times a day (BID) | OPHTHALMIC | 12 refills | Status: DC

## 2020-02-08 NOTE — Progress Notes (Signed)
Subjective:    Patient ID: Jean Hernandez, female    DOB: October 17, 1970, 49 y.o.   MRN: [id number]   HPI: Jean Hernandez is a 49 y.o. female presenting for allergy symptoms. Using visine for eyes with no relief due to swelling. Also sneezing and coughing. Dry cough. No dyspnea except when she coughs really hard. Ongoin for 1.5 weeks. Bendryl not helping.    Depression screen [facility] 2/9 08/27/2019 10/21/2018 09/24/2018 07/01/2018 07/01/2018  Decreased Interest 0 0 0 0 0  Down, Depressed, Hopeless 0 0 0 0 0  PHQ - 2 Score 0 0 0 0 0  Altered sleeping - 0 - - -  Tired, decreased energy - 0 - - -  Change in appetite - 0 - - -  Feeling bad or failure about yourself  - 0 - - -  Trouble concentrating - 0 - - -  Moving slowly or fidgety/restless - 0 - - -  PHQ-9 Score - 0 - - -  Difficult doing work/chores - Not difficult at all - - -     Relevant past medical, surgical, family and social history reviewed and updated as indicated.  Interim medical history since our last visit reviewed. Allergies and medications reviewed and updated.  ROS:  Review of Systems  Constitutional: Negative for activity change, appetite change, chills and fever.  HENT: Positive for congestion, postnasal drip and rhinorrhea. Negative for ear discharge, ear pain, hearing loss, nosebleeds, sneezing and trouble swallowing.   Eyes: Positive for redness and itching.  Respiratory: Positive for cough. Negative for chest tightness and shortness of breath.   Cardiovascular: Negative for chest pain and palpitations.  Skin: Negative for rash.     Social History   Tobacco Use  Smoking Status Never Smoker  Smokeless Tobacco Never Used       Objective:     Wt Readings from Last 3 Encounters:  09/28/19 212 lb 1.3 oz (96.2 kg)  09/24/19 212 lb (96.2 kg)  08/27/19 218 lb (98.9 kg)     Exam deferred. Pt. Harboring due to COVID 19. Phone visit performed.   Assessment & Plan:   1. Seasonal allergic rhinitis due to pollen      Meds ordered this encounter  Medications   olopatadine (PATANOL) 0.1 % ophthalmic solution    Sig: Place 1 drop into both eyes 2 (two) times daily.    Dispense:  5 mL    Refill:  12   predniSONE (DELTASONE) 10 MG tablet    Sig: Take 5 daily for 2 days followed by 4,3,2 and 1 for 2 days each.    Dispense:  30 tablet    Refill:  0   cetirizine (ZYRTEC) 10 MG tablet    Sig: Take 1 tablet (10 mg total) by mouth daily. For allergy symptoms    Dispense:  90 tablet    Refill:  3    No orders of the defined types were placed in this encounter.     Diagnoses and all orders for this visit:  Seasonal allergic rhinitis due to pollen  Other orders -     olopatadine (PATANOL) 0.1 % ophthalmic solution; Place 1 drop into both eyes 2 (two) times daily. -     predniSONE (DELTASONE) 10 MG tablet; Take 5 daily for 2 days followed by 4,3,2 and 1 for 2 days each. -     cetirizine (ZYRTEC) 10 MG tablet; Take 1 tablet (10 mg total) by mouth daily. For allergy  symptoms    Virtual Visit via telephone Note  I discussed the limitations, risks, security and privacy concerns of performing an evaluation and management service by telephone and the availability of in person appointments. The patient was identified with two identifiers. Pt.expressed understanding and agreed to proceed. Pt. Is at home. Dr. Livia Snellen is in his office.  Follow Up Instructions:   I discussed the assessment and treatment plan with the patient. The patient was provided an opportunity to ask questions and all were answered. The patient agreed with the plan and demonstrated an understanding of the instructions.   The patient was advised to call back or seek an in-person evaluation if the symptoms worsen or if the condition fails to improve as anticipated.   Total minutes including chart review and phone contact time: 13   Follow up plan: No follow-ups on file.  Claretta Fraise, MD [facility]

## 2020-02-18 DIAGNOSIS — M0579 Rheumatoid arthritis with rheumatoid factor of multiple sites without organ or systems involvement: Secondary | ICD-10-CM | POA: Diagnosis not present

## 2020-03-23 ENCOUNTER — Telehealth (INDEPENDENT_AMBULATORY_CARE_PROVIDER_SITE_OTHER): Payer: Federal, State, Local not specified - PPO | Admitting: Family Medicine

## 2020-03-23 ENCOUNTER — Encounter: Payer: Self-pay | Admitting: Family Medicine

## 2020-03-23 DIAGNOSIS — M25512 Pain in left shoulder: Secondary | ICD-10-CM

## 2020-03-23 DIAGNOSIS — M25511 Pain in right shoulder: Secondary | ICD-10-CM | POA: Diagnosis not present

## 2020-03-23 DIAGNOSIS — M542 Cervicalgia: Secondary | ICD-10-CM | POA: Diagnosis not present

## 2020-03-23 MED ORDER — METHOCARBAMOL 500 MG PO TABS: 500.0000 mg | ORAL_TABLET | Freq: Four times a day (QID) | ORAL | 0 refills | Status: DC | PRN

## 2020-03-23 MED ORDER — NUCYNTA 100 MG PO TABS: 100.0000 mg | ORAL_TABLET | Freq: Four times a day (QID) | ORAL | 0 refills | Status: DC | PRN
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
## 2020-04-13 DIAGNOSIS — R109 Unspecified abdominal pain: Secondary | ICD-10-CM | POA: Diagnosis not present

## 2020-04-13 DIAGNOSIS — Z79899 Other long term (current) drug therapy: Secondary | ICD-10-CM | POA: Diagnosis not present

## 2020-04-13 DIAGNOSIS — K529 Noninfective gastroenteritis and colitis, unspecified: Secondary | ICD-10-CM | POA: Diagnosis not present

## 2020-04-13 DIAGNOSIS — M069 Rheumatoid arthritis, unspecified: Secondary | ICD-10-CM | POA: Diagnosis not present

## 2020-04-13 DIAGNOSIS — Z885 Allergy status to narcotic agent status: Secondary | ICD-10-CM | POA: Diagnosis not present

## 2020-04-13 DIAGNOSIS — R197 Diarrhea, unspecified: Secondary | ICD-10-CM | POA: Diagnosis not present

## 2020-04-13 DIAGNOSIS — Z886 Allergy status to analgesic agent status: Secondary | ICD-10-CM | POA: Diagnosis not present

## 2020-04-13 DIAGNOSIS — Z880 Allergy status to penicillin: Secondary | ICD-10-CM | POA: Diagnosis not present

## 2020-04-13 DIAGNOSIS — R112 Nausea with vomiting, unspecified: Secondary | ICD-10-CM | POA: Diagnosis not present

## 2020-04-13 DIAGNOSIS — E860 Dehydration: Secondary | ICD-10-CM | POA: Diagnosis not present

## 2020-04-14 DIAGNOSIS — K529 Noninfective gastroenteritis and colitis, unspecified: Secondary | ICD-10-CM | POA: Diagnosis not present

## 2020-04-19 ENCOUNTER — Other Ambulatory Visit: Payer: Self-pay

## 2020-04-19 ENCOUNTER — Encounter: Payer: Self-pay | Admitting: Nurse Practitioner

## 2020-04-19 ENCOUNTER — Ambulatory Visit (INDEPENDENT_AMBULATORY_CARE_PROVIDER_SITE_OTHER): Payer: Federal, State, Local not specified - PPO

## 2020-04-19 ENCOUNTER — Ambulatory Visit (INDEPENDENT_AMBULATORY_CARE_PROVIDER_SITE_OTHER): Payer: Federal, State, Local not specified - PPO | Admitting: Nurse Practitioner

## 2020-04-19 VITALS — BP 128/88 | HR 107 | Temp 97.8°F | Resp 20 | Ht 65.0 in | Wt 218.0 lb

## 2020-04-19 DIAGNOSIS — M79671 Pain in right foot: Secondary | ICD-10-CM | POA: Diagnosis not present

## 2020-04-19 MED ORDER — DICLOFENAC SODIUM 1 % EX GEL: 2.0000 g | Freq: Four times a day (QID) | CUTANEOUS | 1 refills | Status: DC

## 2020-04-19 NOTE — Progress Notes (Signed)
Subjective:    Patient ID: Jean Hernandez, female    DOB: Apr 15, 1971, 49 y.o.   MRN: [id number]  Chief Complaint: Foot Pain (right)   HPI Right foot pain that began Saturday, denies injury but was walking more than usual. States walking elicits some pain, lateral movement elicits more pain. Has not done anything for pain relief, states allergy to many medications. Increased pain on exertion. States elevation relieves pain.    Review of Systems  Constitutional: Negative.   HENT: Negative.   Eyes: Negative.   Respiratory: Negative.   Cardiovascular: Negative.   Gastrointestinal: Negative.   Endocrine: Negative.   Genitourinary: Negative.   Musculoskeletal: Negative.   Skin: Negative.   Allergic/Immunologic: Negative.   Neurological: Negative.   Hematological: Negative.   Psychiatric/Behavioral: Negative.   All other systems reviewed and are negative.      Objective:   Physical Exam Vitals and nursing note reviewed.  Constitutional:      Appearance: Normal appearance.  HENT:     Head: Normocephalic and atraumatic.     Right Ear: External ear normal.     Left Ear: External ear normal.     Nose: Nose normal.     Mouth/Throat:     Mouth: Mucous membranes are moist.     Pharynx: Oropharynx is clear.  Eyes:     Extraocular Movements: Extraocular movements intact.     Conjunctiva/sclera: Conjunctivae normal.     Pupils: Pupils are equal, round, and reactive to light.  Cardiovascular:     Rate and Rhythm: Normal rate and regular rhythm.     Pulses: Normal pulses.     Heart sounds: Normal heart sounds.  Pulmonary:     Effort: Pulmonary effort is normal.     Breath sounds: Normal breath sounds.  Abdominal:     General: Bowel sounds are normal.     Palpations: Abdomen is soft.  Musculoskeletal:        General: Tenderness present.     Cervical back: Normal range of motion and neck supple.     Right foot: Tenderness present.     Left foot: Normal.       Legs:      Comments: FROM in LRE, cal refill <3 seconds, pedal pulses equal bilat +2  Skin:    General: Skin is warm and dry.     Capillary Refill: Capillary refill takes less than 2 seconds.  Neurological:     General: No focal deficit present.     Mental Status: She is alert and oriented to person, place, and time. Mental status is at baseline.  Psychiatric:        Mood and Affect: Mood normal.        Behavior: Behavior normal.        Thought Content: Thought content normal.        Judgment: Judgment normal.    BP 128/88   Pulse (!) 107   Temp 97.8 F (36.6 C) (Temporal)   Resp 20   Ht 5\' 5"  (1.651 m)   Wt 218 lb (98.9 kg)   SpO2 94%   BMI 36.28 kg/m   Right foot xray- clear- Preliminary reading by , FNP  [facility]     Assessment & Plan:  [facility] in today with chief complaint of Foot Pain (right)   1. Right foot pain Rest  Ice BD Wrap  elevate - DG Foot Complete Right    The above assessment and management plan  was discussed with the patient. The patient verbalized understanding of and has agreed to the management plan. Patient is aware to call the clinic if symptoms persist or worsen. Patient is aware when to return to the clinic for a follow-up visit. Patient educated on when it is appropriate to go to the emergency department.   Mary-Margaret Hassell Done, FNP

## 2020-04-19 NOTE — Patient Instructions (Signed)
Elastic Bandage and RICE Therapy  Elastic bandages come in different shapes and sizes. They generally provide support to your injury and reduce swelling while you are healing, but they can perform different functions. Your health care provider will help you to decide what is best for your protection, recovery, or rehabilitation after an injury. The routine care of many injuries includes rest, ice, compression, and elevation (RICE therapy). RICE therapy is often recommended for injuries to soft tissues, such as muscle strain, sprains, bruises, and overuse injuries. It can also be used for some bone injuries. Using RICE therapy can help to relieve pain and lessen swelling. What are some general tips for using an elastic bandage?  Use the bandage as directed by the maker of the bandage that you are using.  Do not wrap the bandage too tightly. This may block (cut off) the circulation in the arm or leg in the area below the bandage. ? If part of your body beyond the bandage becomes blue, numb, cold, swollen, or more painful, your bandage is probably too tight. If this occurs, remove your bandage and reapply it more loosely.  Remove and reapply an elastic bandage every 3-4 hours or as told by your health care provider.  See your health care provider if the bandage seems to be making your problems worse rather than better. How to care for your injury with RICE therapy Rest Rest your injury. This may help with the healing process. Rest usually involves limiting your normal activities and not using the injured part of your body. Generally, you can return to your normal activities when your health care provider says it is okay and you can do them without much discomfort. If you rest the injury too much, it may not heal as well. Some injuries heal better with early movement instead of resting for too long. Talk with your health care provider about how you should limit your activities and whether you should  start range-of-motion exercises for your injury. Ice Ice your injury to lessen swelling and pain. Do not apply ice directly to your skin.  Put ice in a plastic bag.  Place a towel between your skin and the bag.  Leave the ice on for 20 minutes, 2-3 times a day. Use ice on as many days as told by your health care provider.  Compression Put pressure (compression) on your injured area to control swelling, give support, and help with discomfort. Compression may be done with an elastic bandage. Elevation Raise (elevate) your injured area to lessen swelling and pain. If possible, elevate your injured area at or above the level of your heart or the center of your chest. Contact a health care provider if:  Your pain and swelling continue.  Your symptoms are getting worse rather than improving. Having these problems may mean that you need further evaluation or imaging tests, such as X-rays or an MRI. Sometimes, X-rays may not show a small broken bone (fracture) until days after the injury happened. Make a follow-up appointment with your health care provider. Ask your health care provider, or the department that is doing the imaging test, when your results will be ready. Get help right away if:  You have sudden severe pain at or below the area of your injury.  You have redness or increased swelling around your injury.  You have tingling or numbness at or below the area of your injury and it does not improve after you remove the elastic bandage. Summary  Elastic  bandages provide support to your injury and reduce swelling while you are healing. Your health care provider will help you decide which type of elastic bandage is best for your injury.  Do not wrap the bandage too tightly. This may block (cut off) the circulation in the arm or leg in the area below the bandage.  Putting pressure (compression) on your injured area with an elastic bandage is part of RICE therapy. RICE therapy includes  rest, ice, compression, and elevation. This treatment is recommended for the routine care of many injuries. This information is not intended to replace advice given to you by your health care provider. Make sure you discuss any questions you have with your health care provider. Document Revised: 06/21/2017 Document Reviewed: 06/21/2017 Elsevier Patient Education  2020 [employer]  

## 2020-04-27 DIAGNOSIS — M2550 Pain in unspecified joint: Secondary | ICD-10-CM | POA: Diagnosis not present

## 2020-04-27 DIAGNOSIS — M818 Other osteoporosis without current pathological fracture: Secondary | ICD-10-CM | POA: Diagnosis not present

## 2020-04-27 DIAGNOSIS — M0579 Rheumatoid arthritis with rheumatoid factor of multiple sites without organ or systems involvement: Secondary | ICD-10-CM | POA: Diagnosis not present

## 2020-04-27 DIAGNOSIS — M170 Bilateral primary osteoarthritis of knee: Secondary | ICD-10-CM | POA: Diagnosis not present

## 2020-05-13 DIAGNOSIS — M818 Other osteoporosis without current pathological fracture: Secondary | ICD-10-CM | POA: Diagnosis not present

## 2020-05-27 DIAGNOSIS — Z471 Aftercare following joint replacement surgery: Secondary | ICD-10-CM | POA: Diagnosis not present

## 2020-05-27 DIAGNOSIS — Z96652 Presence of left artificial knee joint: Secondary | ICD-10-CM | POA: Diagnosis not present

## 2020-06-09 DIAGNOSIS — S134XXA Sprain of ligaments of cervical spine, initial encounter: Secondary | ICD-10-CM | POA: Diagnosis not present

## 2020-06-16 DIAGNOSIS — S134XXA Sprain of ligaments of cervical spine, initial encounter: Secondary | ICD-10-CM | POA: Diagnosis not present

## 2020-06-29 ENCOUNTER — Other Ambulatory Visit: Payer: Federal, State, Local not specified - PPO

## 2020-06-29 ENCOUNTER — Other Ambulatory Visit: Payer: Self-pay

## 2020-06-29 DIAGNOSIS — Z96652 Presence of left artificial knee joint: Secondary | ICD-10-CM | POA: Diagnosis not present

## 2020-06-30 DIAGNOSIS — S134XXA Sprain of ligaments of cervical spine, initial encounter: Secondary | ICD-10-CM | POA: Diagnosis not present

## 2020-07-04 ENCOUNTER — Other Ambulatory Visit: Payer: Self-pay | Admitting: Family Medicine

## 2020-07-04 DIAGNOSIS — Z96652 Presence of left artificial knee joint: Secondary | ICD-10-CM

## 2020-07-13 DIAGNOSIS — S134XXA Sprain of ligaments of cervical spine, initial encounter: Secondary | ICD-10-CM | POA: Diagnosis not present

## 2020-07-27 DIAGNOSIS — M62830 Muscle spasm of back: Secondary | ICD-10-CM | POA: Diagnosis not present

## 2020-07-27 DIAGNOSIS — S134XXA Sprain of ligaments of cervical spine, initial encounter: Secondary | ICD-10-CM | POA: Diagnosis not present

## 2020-08-11 ENCOUNTER — Telehealth: Payer: Self-pay

## 2020-08-11 DIAGNOSIS — J019 Acute sinusitis, unspecified: Secondary | ICD-10-CM | POA: Diagnosis not present

## 2020-08-17 DIAGNOSIS — S134XXA Sprain of ligaments of cervical spine, initial encounter: Secondary | ICD-10-CM | POA: Diagnosis not present

## 2020-08-17 DIAGNOSIS — M62830 Muscle spasm of back: Secondary | ICD-10-CM | POA: Diagnosis not present

## 2020-09-06 DIAGNOSIS — M62830 Muscle spasm of back: Secondary | ICD-10-CM | POA: Diagnosis not present

## 2020-09-06 DIAGNOSIS — S134XXA Sprain of ligaments of cervical spine, initial encounter: Secondary | ICD-10-CM | POA: Diagnosis not present

## 2020-09-15 DIAGNOSIS — Z96652 Presence of left artificial knee joint: Secondary | ICD-10-CM | POA: Diagnosis not present

## 2020-09-15 DIAGNOSIS — Z471 Aftercare following joint replacement surgery: Secondary | ICD-10-CM | POA: Diagnosis not present

## 2020-09-28 DIAGNOSIS — M62830 Muscle spasm of back: Secondary | ICD-10-CM | POA: Diagnosis not present

## 2020-09-28 DIAGNOSIS — S134XXA Sprain of ligaments of cervical spine, initial encounter: Secondary | ICD-10-CM | POA: Diagnosis not present

## 2020-11-03 DIAGNOSIS — M0579 Rheumatoid arthritis with rheumatoid factor of multiple sites without organ or systems involvement: Secondary | ICD-10-CM | POA: Diagnosis not present

## 2020-11-03 DIAGNOSIS — Z79899 Other long term (current) drug therapy: Secondary | ICD-10-CM | POA: Diagnosis not present

## 2020-11-03 DIAGNOSIS — M170 Bilateral primary osteoarthritis of knee: Secondary | ICD-10-CM | POA: Diagnosis not present

## 2020-11-03 DIAGNOSIS — M2550 Pain in unspecified joint: Secondary | ICD-10-CM | POA: Diagnosis not present

## 2021-02-02 DIAGNOSIS — M0579 Rheumatoid arthritis with rheumatoid factor of multiple sites without organ or systems involvement: Secondary | ICD-10-CM | POA: Diagnosis not present

## 2021-02-10 IMAGING — MG DIGITAL DIAGNOSTIC BILAT W/ TOMO W/ CAD
8 series · 8 of 24 positions shown · non-contrast
Comparison: None.

CLINICAL DATA: Patient complains of palpable abnormalities in the
medial aspects of the inframammary folds of both breast.

EXAM:
DIGITAL DIAGNOSTIC BILATERAL MAMMOGRAM WITH CAD AND TOMO
ULTRASOUND BILATERAL BREAST

[L CC synth-2D]
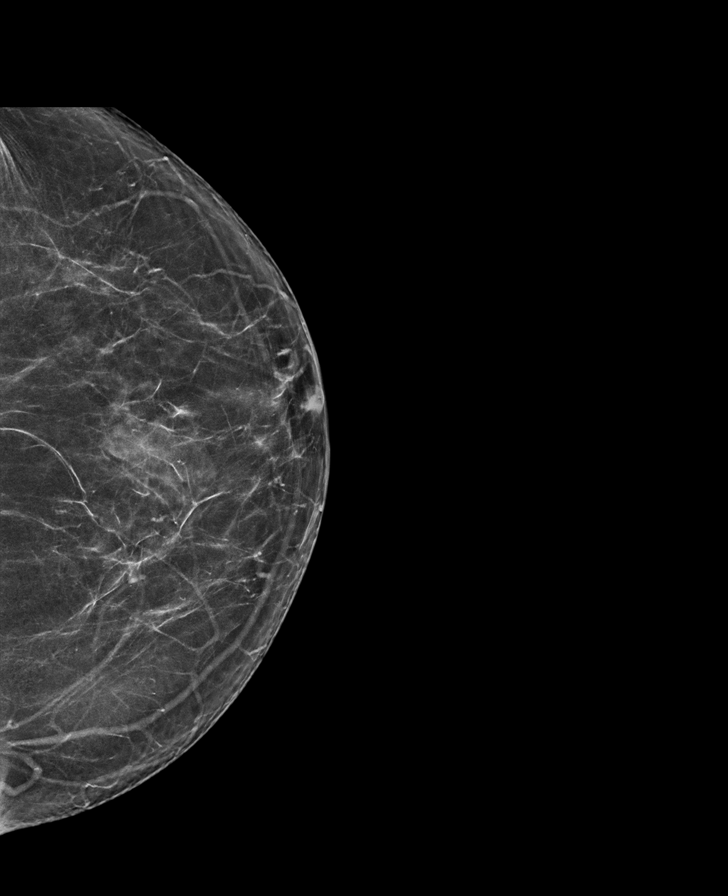

[L MLO synth-2D]
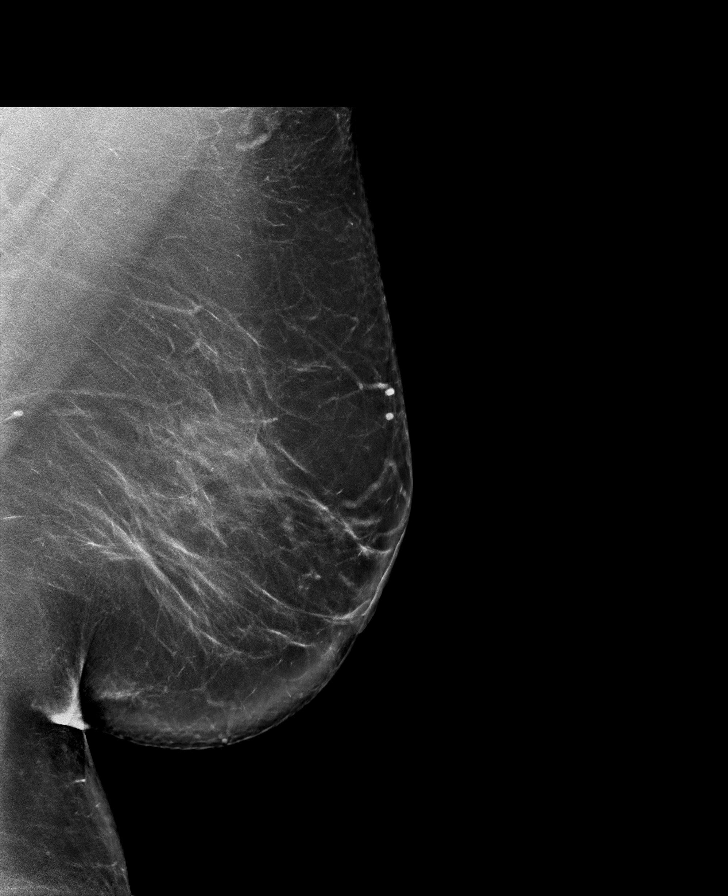

[R CC synth-2D]
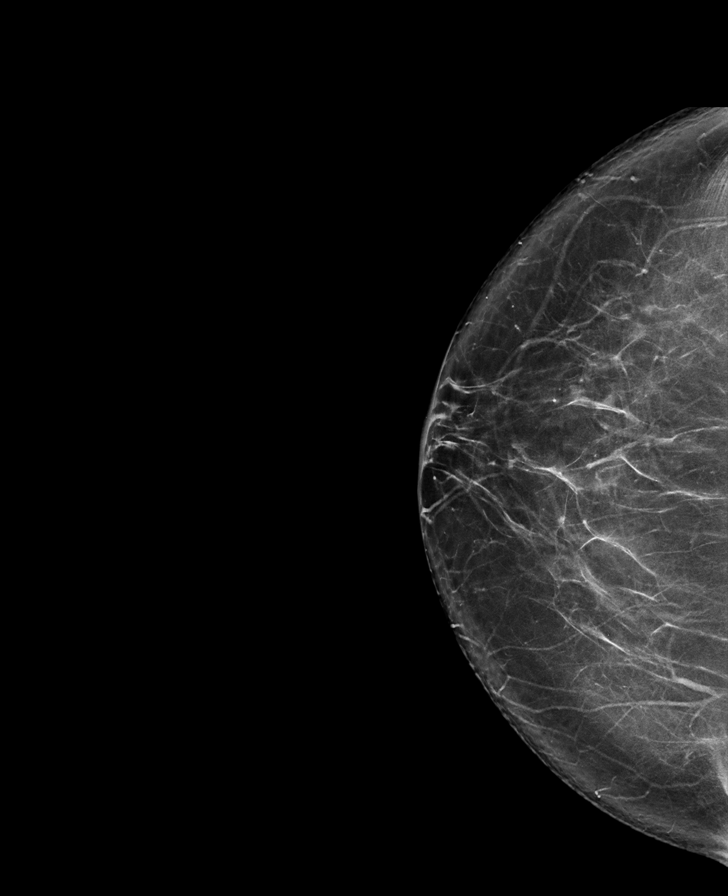

[R MLO synth-2D]
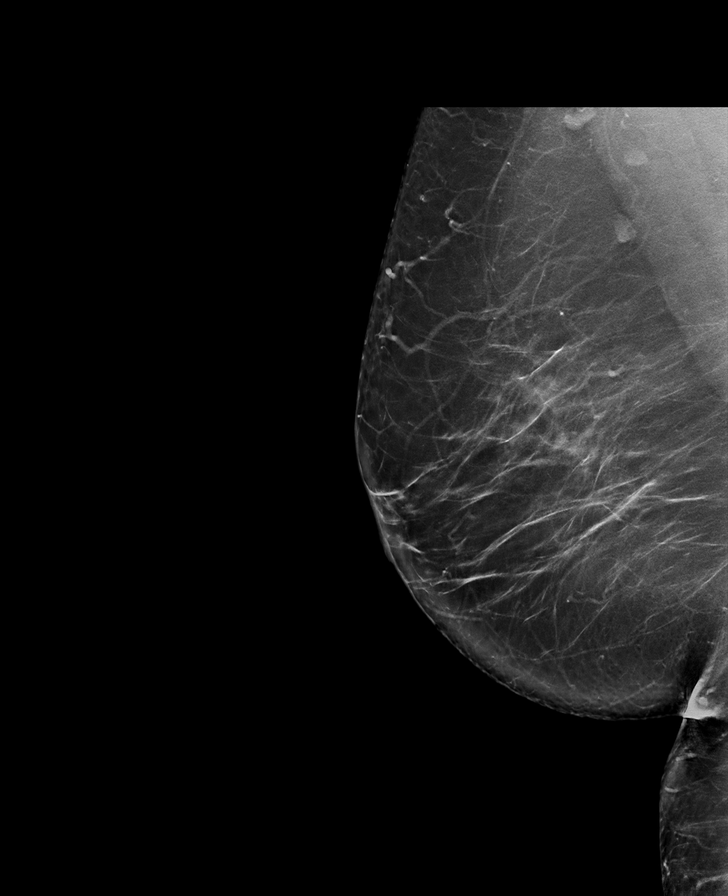

[R MLO tomo · tomo slice 48/95.0]
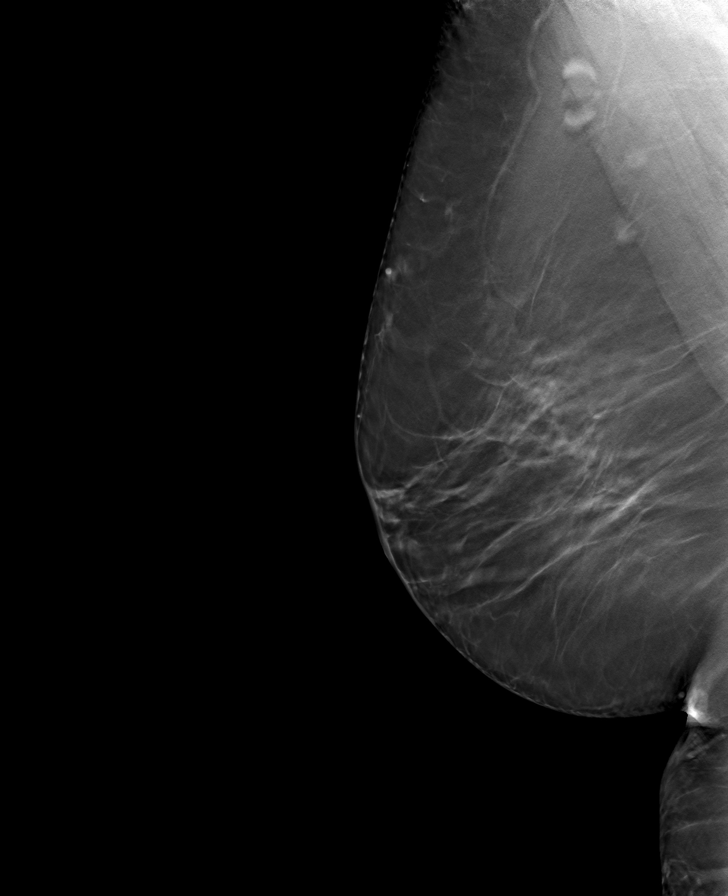

[R CC tomo · tomo slice 43/85.0]
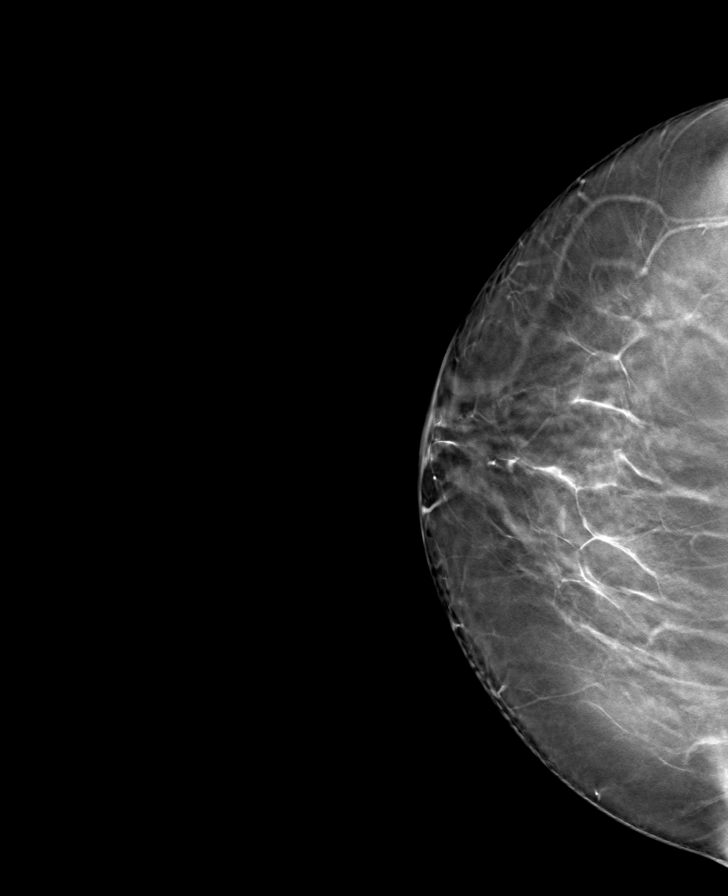

[L MLO tomo · tomo slice 53/105.0]
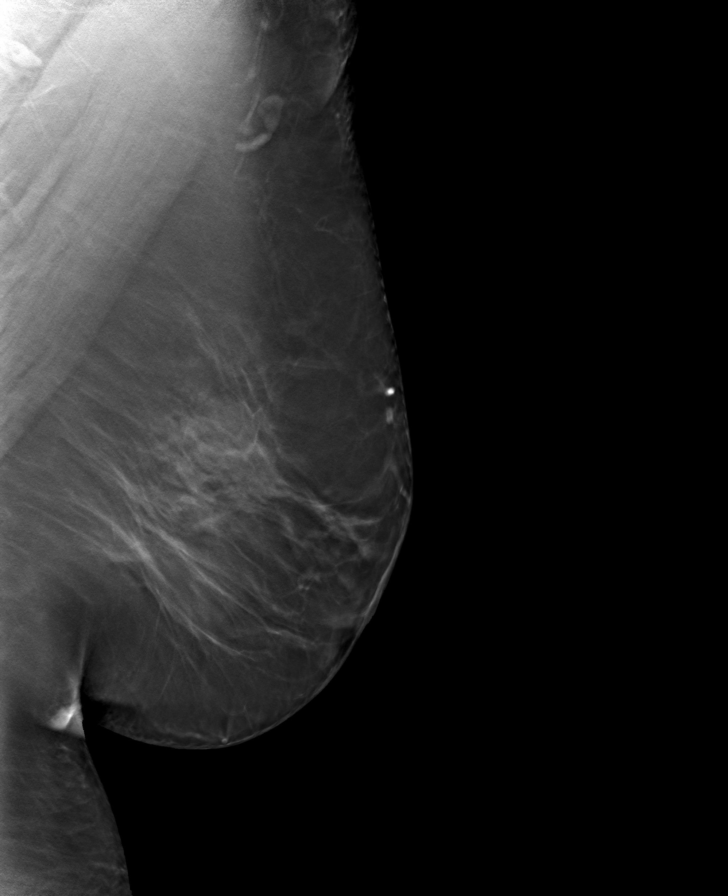

[L CC tomo · tomo slice 38/75.0]
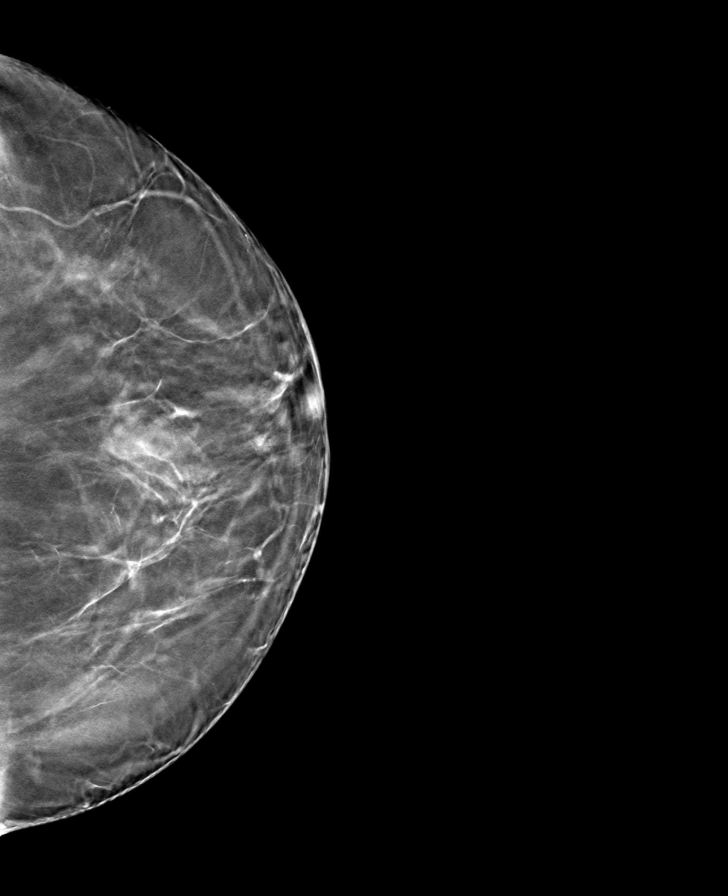

[8 of 24 positions shown; findings below may reference images not displayed]

ACR Breast Density Category b: There are scattered areas of
fibroglandular density.
FINDINGS: No suspicious mass, malignant type microcalcifications or distortion
detected in either breast.

Mammographic images were processed with CAD.

On physical exam, there is mild thickening in the medial aspects of
the inframammary folds of both breast. No discrete mass is palpated.

Targeted ultrasound is performed, showing normal tissue in the areas
of clinical concern in the medial aspects of the inframammary folds
of both breast.
IMPRESSION: No evidence of malignancy in either breast.

RECOMMENDATION:
Bilateral screening mammogram in 1 year is recommended.

I have discussed the findings and recommendations with the patient.
If applicable, a reminder letter will be sent to the patient
regarding the next appointment.

BI-RADS CATEGORY  1: Negative.

## 2021-02-13 ENCOUNTER — Ambulatory Visit: Payer: Federal, State, Local not specified - PPO | Admitting: Family Medicine

## 2021-02-13 ENCOUNTER — Encounter: Payer: Self-pay | Admitting: Family Medicine

## 2021-02-13 VITALS — BP 156/98 | HR 111 | Ht 65.0 in

## 2021-02-13 DIAGNOSIS — R0981 Nasal congestion: Secondary | ICD-10-CM

## 2021-02-13 DIAGNOSIS — R519 Headache, unspecified: Secondary | ICD-10-CM | POA: Diagnosis not present

## 2021-02-13 DIAGNOSIS — R059 Cough, unspecified: Secondary | ICD-10-CM

## 2021-02-13 LAB — VERITOR FLU A/B WAIVED
Influenza A: NEGATIVE
Influenza B: NEGATIVE

## 2021-02-13 MED ORDER — AZITHROMYCIN 250 MG PO TABS: ORAL_TABLET | ORAL | 0 refills | Status: DC

## 2021-02-13 MED ORDER — FLUTICASONE PROPIONATE 50 MCG/ACT NA SUSP: 1.0000 | Freq: Two times a day (BID) | NASAL | 6 refills | Status: DC | PRN

## 2021-02-13 NOTE — Progress Notes (Signed)
BP (!) 156/98   Pulse (!) 111   Ht 5\' 5"  (1.651 m)   SpO2 93%   BMI 36.28 kg/m    Subjective:   Patient ID: Jean Hernandez, female    DOB: Jul 20, 1971, 50 y.o.   MRN: [id number]  HPI: Jean Hernandez is a 50 y.o. female presenting on 02/13/2021 for URI (Head pressure and ear drainage on right side. Nasal congestion. Non productive. Mild cough./)   HPI Patient comes in complaining of ear congestion and pressure that is been going on over the past 2 days.  She complains of cough.  She denies any fevers or chills or COVID sick contacts.  She denies any shortness of breath or wheezing.  She says a lot of the drainage she is getting is coming down from her sinuses down the back of her throat which is causing her to have a mildly sore throat and a cough.  She says the cough is worse at night.  She has tried over-the-counter Tylenol Sinus without much success.  Relevant past medical, surgical, family and social history reviewed and updated as indicated. Interim medical history since our last visit reviewed. Allergies and medications reviewed and updated.  Review of Systems  Constitutional: Negative for chills and fever.  HENT: Positive for congestion, ear pain, postnasal drip, rhinorrhea, sinus pressure, sneezing and sore throat. Negative for ear discharge.   Eyes: Negative for pain, redness and visual disturbance.  Respiratory: Positive for cough. Negative for chest tightness, shortness of breath and wheezing.   Cardiovascular: Negative for chest pain and leg swelling.  Genitourinary: Negative for difficulty urinating and dysuria.  Musculoskeletal: Negative for back pain and gait problem.  Skin: Negative for rash.  Neurological: Negative for light-headedness and headaches.  Psychiatric/Behavioral: Negative for agitation and behavioral problems.  All other systems reviewed and are negative.   Per HPI unless specifically indicated above    Objective:   BP (!) 156/98   Pulse (!) 111   Ht  5\' 5"  (1.651 m)   SpO2 93%   BMI 36.28 kg/m   Wt Readings from Last 3 Encounters:  04/19/20 218 lb (98.9 kg)  09/28/19 212 lb 1.3 oz (96.2 kg)  09/24/19 212 lb (96.2 kg)    Physical Exam Vitals reviewed.  Constitutional:      General: She is not in acute distress.    Appearance: She is well-developed. She is not diaphoretic.  HENT:     Right Ear: Tympanic membrane, ear canal and external ear normal.     Left Ear: Tympanic membrane, ear canal and external ear normal.     Nose: Mucosal edema and rhinorrhea present.     Right Sinus: No maxillary sinus tenderness or frontal sinus tenderness.     Left Sinus: No maxillary sinus tenderness or frontal sinus tenderness.     Mouth/Throat:     Pharynx: Uvula midline. Posterior oropharyngeal erythema present. No oropharyngeal exudate.     Tonsils: No tonsillar abscesses.  Eyes:     Conjunctiva/sclera: Conjunctivae normal.  Cardiovascular:     Rate and Rhythm: Normal rate and regular rhythm.     Heart sounds: Normal heart sounds. No murmur heard.   Pulmonary:     Effort: Pulmonary effort is normal. No respiratory distress.     Breath sounds: Normal breath sounds. No wheezing.  Musculoskeletal:        General: No tenderness. Normal range of motion.  Skin:    General: Skin is warm and dry.  Findings: No rash.  Neurological:     Mental Status: She is alert and oriented to person, place, and time.     Coordination: Coordination normal.  Psychiatric:        Behavior: Behavior normal.       Assessment & Plan:   Problem List Items Addressed This Visit   None   Visit Diagnoses    Nasal congestion    -  Primary   Relevant Medications   azithromycin (ZITHROMAX) 250 MG tablet   fluticasone (FLONASE) 50 MCG/ACT nasal spray   Other Relevant Orders   Novel Coronavirus, NAA ([facility])   Veritor Flu A/B Waived   Pressure in head       Relevant Medications   azithromycin (ZITHROMAX) 250 MG tablet   fluticasone (FLONASE) 50 MCG/ACT  nasal spray   Other Relevant Orders   Novel Coronavirus, NAA ([facility])   Veritor Flu A/B Waived   Cough       Relevant Medications   azithromycin (ZITHROMAX) 250 MG tablet   fluticasone (FLONASE) 50 MCG/ACT nasal spray   Other Relevant Orders   Novel Coronavirus, NAA ([facility])   Veritor Flu A/B Waived      It sounds like sinus infection, will treat as such but will also give COVID test and flu test and recommend quarantine until results are back. Follow up plan: Return if symptoms worsen or fail to improve.  Counseling provided for all of the vaccine components Orders Placed This Encounter  Procedures  . Novel Coronavirus, NAA ([facility])  . Veritor Flu A/B Waived    Arville Care, MD [facility] 02/13/2021, 4:32 PM

## 2021-02-14 LAB — NOVEL CORONAVIRUS, NAA: SARS-CoV-2, NAA: NOT DETECTED

## 2021-02-15 ENCOUNTER — Other Ambulatory Visit: Payer: Self-pay | Admitting: Family Medicine

## 2021-02-15 MED ORDER — BENZONATATE 100 MG PO CAPS: 100.0000 mg | ORAL_CAPSULE | Freq: Three times a day (TID) | ORAL | 0 refills | Status: DC | PRN

## 2021-02-15 NOTE — Progress Notes (Signed)
Patient aware.

## 2021-02-15 NOTE — Progress Notes (Signed)
Sent Tessalon Perles for her

## 2021-02-16 ENCOUNTER — Other Ambulatory Visit: Payer: Self-pay | Admitting: Family Medicine

## 2021-03-17 ENCOUNTER — Encounter: Payer: Self-pay | Admitting: Family Medicine

## 2021-04-27 ENCOUNTER — Ambulatory Visit: Payer: Federal, State, Local not specified - PPO | Admitting: Family Medicine

## 2021-04-27 ENCOUNTER — Other Ambulatory Visit: Payer: Self-pay

## 2021-04-27 ENCOUNTER — Encounter: Payer: Self-pay | Admitting: Family Medicine

## 2021-04-27 VITALS — BP 139/93 | HR 114 | Temp 97.7°F | Ht 65.0 in | Wt 215.8 lb

## 2021-04-27 DIAGNOSIS — E8881 Metabolic syndrome: Secondary | ICD-10-CM

## 2021-04-27 DIAGNOSIS — E039 Hypothyroidism, unspecified: Secondary | ICD-10-CM

## 2021-04-27 DIAGNOSIS — E7800 Pure hypercholesterolemia, unspecified: Secondary | ICD-10-CM

## 2021-04-27 DIAGNOSIS — M069 Rheumatoid arthritis, unspecified: Secondary | ICD-10-CM | POA: Diagnosis not present

## 2021-04-27 MED ORDER — OZEMPIC (0.25 OR 0.5 MG/DOSE) 2 MG/1.5ML ~~LOC~~ SOPN: 0.5000 mg | PEN_INJECTOR | SUBCUTANEOUS | 2 refills | Status: DC

## 2021-04-27 NOTE — Progress Notes (Signed)
Subjective:  Patient ID: Jean Hernandez, female    DOB: January 20, 1971  Age: 50 y.o. MRN: [id number]  CC: weight management   HPI Jean Hernandez presents for fatigue, not sleeping well.  She has come through her surgery and wants to get back on track losing more weight.  The left knee is still tender because she had an infection during the postoperative period along the superior aspect of the suture line.  Apparently it did not get into the joint.  Patient does have rheumatoid arthritis she is injecting herself with weekly Cimzia.  Patient has a history of hypothyroidism.  She has been off her medicine for several months.  She has been putting on weight.  She is in today for reevaluation for the thyroid condition as well as weight loss.  Depression screen [facility] 2/9 04/27/2021 04/19/2020 08/27/2019  Decreased Interest 0 0 0  Down, Depressed, Hopeless 0 0 0  PHQ - 2 Score 0 0 0  Altered sleeping 3 - -  Tired, decreased energy 3 - -  Change in appetite 1 - -  Feeling bad or failure about yourself  0 - -  Trouble concentrating 0 - -  Moving slowly or fidgety/restless 0 - -  Suicidal thoughts 0 - -  PHQ-9 Score 7 - -  Difficult doing work/chores Not difficult at all - -    History Jean Hernandez has a past medical history of Anxiety, Asthma, GERD (gastroesophageal reflux disease), Hypothyroidism, Rheumatoid arthritis ([place]), and Sleep apnea.   She has a past surgical history that includes C sections; colonsocopy (2012); Abdominal hysterectomy; and Total knee arthroplasty (Left, 09/28/2019).   Her family history includes Allergies in her father, mother, and sister; Dementia in her father; Diabetes in her father; Early death in her father; Heart disease in her father; Hypertension in her mother.She reports that she has never smoked. She has never used smokeless tobacco. She reports that she does not drink alcohol and does not use drugs.    ROS Review of Systems  Constitutional: Negative.   HENT: Negative.     Eyes:  Negative for visual disturbance.  Respiratory:  Negative for shortness of breath.   Cardiovascular:  Negative for chest pain.  Gastrointestinal:  Negative for abdominal pain.  Musculoskeletal:  Negative for arthralgias.  Psychiatric/Behavioral:  Positive for sleep disturbance.    Objective:  BP (!) 139/93   Pulse (!) 114   Temp 97.7 F (36.5 C)   Ht _0  (1.651 m)   Wt 215 lb 12.8 oz (97.9 kg)   SpO2 96%   BMI 35.91 kg/m   BP Readings from Last 3 Encounters:  04/27/21 (!) 139/93  02/13/21 (!) 156/98  04/19/20 128/88    Wt Readings from Last 3 Encounters:  04/27/21 215 lb 12.8 oz (97.9 kg)  04/19/20 218 lb (98.9 kg)  09/28/19 212 lb 1.3 oz (96.2 kg)     Physical Exam Constitutional:      General: She is not in acute distress.    Appearance: She is well-developed.  Cardiovascular:     Rate and Rhythm: Normal rate and regular rhythm.  Pulmonary:     Breath sounds: Normal breath sounds.  Musculoskeletal:        General: Normal range of motion.  Skin:    General: Skin is warm and dry.  Neurological:     Mental Status: She is alert and oriented to person, place, and time.      Assessment & Plan:  Jean Hernandez was seen today for weight management.  Diagnoses and all orders for this visit:  Hypothyroidism, unspecified type -     CMP14+EGFR -     TSH + free T4  Metabolic syndrome -     Bayer DCA Hb A1c Waived -     CBC with Differential/Platelet -     CMP14+EGFR -     Lipid panel -     TSH + free T4  Rheumatoid arthritis involving multiple sites, unspecified whether rheumatoid factor present (HCC) -     CMP14+EGFR  Pure hypercholesterolemia -     CMP14+EGFR -     Lipid panel  Other orders -     Semaglutide,0.25 or 0.5MG /DOS, (OZEMPIC, 0.25 OR 0.5 MG/DOSE,) 2 MG/1.5ML SOPN; Inject 0.5 mg into the skin once a week.      I have discontinued Cannon Quinton. Rinke's Forteo, montelukast, levothyroxine, chlorpheniramine, diphenhydrAMINE, olopatadine,  methocarbamol, Nucynta, azithromycin, fluticasone, and benzonatate. I am also having her start on Ozempic (0.25 or 0.5 MG/DOSE). Additionally, I am having her maintain her Calcium Citrate (CITRACAL PO), Certolizumab Pegol, leflunomide, pantoprazole, Vitamin D-1000 Max St, gabapentin, cetirizine, and diclofenac Sodium.  Allergies as of 04/27/2021       Reactions   Amoxicillin Itching, Rash   REACTION: hives   Other Itching   Dilaudid   Oxycodone-acetaminophen Itching, Rash   Percocet [oxycodone-acetaminophen] Itching, Rash   Ultracet [tramadol-acetaminophen] Itching, Rash, Hives   REACTION: itching   Vicodin [hydrocodone-acetaminophen] Itching, Hives   Propoxyphene N-acetaminophen    REACTION: itching        Medication List        Accurate as of April 27, 2021  3:06 PM. If you have any questions, ask your nurse or doctor.          STOP taking these medications    azithromycin 250 MG tablet Commonly known as: ZITHROMAX Stopped by: Claretta Fraise, MD   benzonatate 100 MG capsule Commonly known as: Best boy Stopped by: Claretta Fraise, MD   chlorpheniramine 4 MG tablet Commonly known as: CHLOR-TRIMETON Stopped by: Claretta Fraise, MD   diphenhydrAMINE 12.5 MG/5ML elixir Commonly known as: BENADRYL Stopped by: Claretta Fraise, MD   fluticasone 50 MCG/ACT nasal spray Commonly known as: FLONASE Stopped by: Claretta Fraise, MD   Forteo 750 MCG/3ML injection Generic drug: teriparatide Stopped by: Claretta Fraise, MD   levothyroxine 50 MCG tablet Commonly known as: SYNTHROID Stopped by: Claretta Fraise, MD   methocarbamol 500 MG tablet Commonly known as: ROBAXIN Stopped by: Claretta Fraise, MD   montelukast 10 MG tablet Commonly known as: SINGULAIR Stopped by: Claretta Fraise, MD   Nucynta 100 MG Tabs Generic drug: Tapentadol HCl Stopped by: Claretta Fraise, MD   olopatadine 0.1 % ophthalmic solution Commonly known as: PATANOL Stopped by: Claretta Fraise, MD        TAKE these medications    Certolizumab Pegol 2 X 200 MG Kit Inject into the skin every 30 (thirty) days.   cetirizine 10 MG tablet Commonly known as: ZYRTEC Take 1 tablet (10 mg total) by mouth daily. For allergy symptoms   CITRACAL PO Take 1 tablet by mouth 2 (two) times daily.   diclofenac Sodium 1 % Gel Commonly known as: Voltaren Apply 2 g topically 4 (four) times daily.   gabapentin 300 MG capsule Commonly known as: NEURONTIN Take 1 capsule (300 mg total) by mouth 3 (three) times daily. Take a 300 mg capsule three times a day for two weeks following surgery.Then take a  300 mg capsule two times a day for two weeks. Then take a 300 mg capsule once a day for two weeks. Then resume 100 mg every night at bedtime.   leflunomide 20 MG tablet Commonly known as: ARAVA On tablet What changed:  how much to take how to take this when to take this additional instructions   Ozempic (0.25 or 0.5 MG/DOSE) 2 MG/1.5ML Sopn Generic drug: Semaglutide(0.25 or 0.5MG/DOS) Inject 0.5 mg into the skin once a week. Started by: Claretta Fraise, MD   pantoprazole 40 MG tablet Commonly known as: PROTONIX TAKE 1 TABLET TWICE DAILY   Vitamin D-1000 Max St 25 MCG (1000 UT) tablet Generic drug: Cholecalciferol Take 1,000 Units by mouth daily.         Follow-up: Return in about 6 weeks (around 06/08/2021) for Hypothyroidism.  Claretta Fraise, M.D.

## 2021-04-28 ENCOUNTER — Other Ambulatory Visit: Payer: Federal, State, Local not specified - PPO

## 2021-04-28 ENCOUNTER — Encounter: Payer: Self-pay | Admitting: Family Medicine

## 2021-04-28 DIAGNOSIS — E8881 Metabolic syndrome: Secondary | ICD-10-CM | POA: Diagnosis not present

## 2021-04-28 DIAGNOSIS — E039 Hypothyroidism, unspecified: Secondary | ICD-10-CM | POA: Diagnosis not present

## 2021-04-28 DIAGNOSIS — M069 Rheumatoid arthritis, unspecified: Secondary | ICD-10-CM | POA: Diagnosis not present

## 2021-04-28 DIAGNOSIS — E7800 Pure hypercholesterolemia, unspecified: Secondary | ICD-10-CM | POA: Diagnosis not present

## 2021-04-28 LAB — BAYER DCA HB A1C WAIVED: HB A1C (BAYER DCA - WAIVED): 5.6 % (ref <7.0)

## 2021-04-28 NOTE — Progress Notes (Signed)
Hello Alizaya,  Your lab result is normal and/or stable.Some minor variations that are not significant are commonly marked abnormal, but do not represent any medical problem for you.  Best regards, Mechele Claude, M.D.

## 2021-04-29 LAB — CBC WITH DIFFERENTIAL/PLATELET
Basophils Absolute: 0 10*3/uL (ref 0.0–0.2)
Basos: 1 %
EOS (ABSOLUTE): 0.1 10*3/uL (ref 0.0–0.4)
Eos: 2 %
Hematocrit: 44.8 % (ref 34.0–46.6)
Hemoglobin: 15.4 g/dL (ref 11.1–15.9)
Immature Grans (Abs): 0 10*3/uL (ref 0.0–0.1)
Immature Granulocytes: 0 %
Lymphocytes Absolute: 2.4 10*3/uL (ref 0.7–3.1)
Lymphs: 33 %
MCH: 31.9 pg (ref 26.6–33.0)
MCHC: 34.4 g/dL (ref 31.5–35.7)
MCV: 93 fL (ref 79–97)
Monocytes Absolute: 0.7 10*3/uL (ref 0.1–0.9)
Monocytes: 9 %
Neutrophils Absolute: 3.9 10*3/uL (ref 1.4–7.0)
Neutrophils: 55 %
Platelets: 238 10*3/uL (ref 150–450)
RBC: 4.83 x10E6/uL (ref 3.77–5.28)
RDW: 12.1 % (ref 11.7–15.4)
WBC: 7.1 10*3/uL (ref 3.4–10.8)

## 2021-04-29 LAB — CMP14+EGFR
ALT: 24 IU/L (ref 0–32)
AST: 22 IU/L (ref 0–40)
Albumin/Globulin Ratio: 1.7 (ref 1.2–2.2)
Albumin: 4.3 g/dL (ref 3.8–4.9)
Alkaline Phosphatase: 81 IU/L (ref 44–121)
BUN/Creatinine Ratio: 21 (ref 9–23)
BUN: 16 mg/dL (ref 6–24)
Bilirubin Total: 0.6 mg/dL (ref 0.0–1.2)
CO2: 21 mmol/L (ref 20–29)
Calcium: 9.6 mg/dL (ref 8.7–10.2)
Chloride: 100 mmol/L (ref 96–106)
Creatinine, Ser: 0.78 mg/dL (ref 0.57–1.00)
Globulin, Total: 2.5 g/dL (ref 1.5–4.5)
Glucose: 107 mg/dL — ABNORMAL HIGH (ref 65–99)
Potassium: 4.3 mmol/L (ref 3.5–5.2)
Sodium: 137 mmol/L (ref 134–144)
Total Protein: 6.8 g/dL (ref 6.0–8.5)
eGFR: 92 mL/min/{1.73_m2} (ref >59)

## 2021-04-29 LAB — LIPID PANEL
Chol/HDL Ratio: 5.5 ratio — ABNORMAL HIGH (ref 0.0–4.4)
Cholesterol, Total: 203 mg/dL — ABNORMAL HIGH (ref 100–199)
HDL: 37 mg/dL — ABNORMAL LOW (ref >39)
LDL Chol Calc (NIH): 133 mg/dL — ABNORMAL HIGH (ref 0–99)
Triglycerides: 186 mg/dL — ABNORMAL HIGH (ref 0–149)
VLDL Cholesterol Cal: 33 mg/dL (ref 5–40)

## 2021-04-29 LAB — TSH+FREE T4
Free T4: 1.02 ng/dL (ref 0.82–1.77)
TSH: 2.14 u[IU]/mL (ref 0.450–4.500)

## 2021-05-01 ENCOUNTER — Encounter: Payer: Self-pay | Admitting: Family Medicine

## 2021-05-04 ENCOUNTER — Encounter: Payer: Self-pay | Admitting: Family Medicine

## 2021-05-05 DIAGNOSIS — M818 Other osteoporosis without current pathological fracture: Secondary | ICD-10-CM | POA: Diagnosis not present

## 2021-05-11 DIAGNOSIS — M170 Bilateral primary osteoarthritis of knee: Secondary | ICD-10-CM | POA: Diagnosis not present

## 2021-05-11 DIAGNOSIS — M0579 Rheumatoid arthritis with rheumatoid factor of multiple sites without organ or systems involvement: Secondary | ICD-10-CM | POA: Diagnosis not present

## 2021-05-11 DIAGNOSIS — M818 Other osteoporosis without current pathological fracture: Secondary | ICD-10-CM | POA: Diagnosis not present

## 2021-05-11 DIAGNOSIS — M2550 Pain in unspecified joint: Secondary | ICD-10-CM | POA: Diagnosis not present

## 2021-05-19 DIAGNOSIS — E661 Drug-induced obesity: Secondary | ICD-10-CM | POA: Diagnosis not present

## 2021-07-26 DIAGNOSIS — E663 Overweight: Secondary | ICD-10-CM | POA: Diagnosis not present

## 2021-07-26 DIAGNOSIS — R5382 Chronic fatigue, unspecified: Secondary | ICD-10-CM | POA: Diagnosis not present

## 2021-07-26 DIAGNOSIS — N926 Irregular menstruation, unspecified: Secondary | ICD-10-CM | POA: Diagnosis not present

## 2021-07-26 DIAGNOSIS — E6609 Other obesity due to excess calories: Secondary | ICD-10-CM | POA: Diagnosis not present

## 2021-07-26 DIAGNOSIS — L658 Other specified nonscarring hair loss: Secondary | ICD-10-CM | POA: Diagnosis not present

## 2021-08-17 ENCOUNTER — Other Ambulatory Visit: Payer: Self-pay | Admitting: Rehabilitation

## 2021-08-17 DIAGNOSIS — M4125 Other idiopathic scoliosis, thoracolumbar region: Secondary | ICD-10-CM

## 2021-08-17 DIAGNOSIS — M5416 Radiculopathy, lumbar region: Secondary | ICD-10-CM

## 2021-09-12 ENCOUNTER — Other Ambulatory Visit: Payer: Self-pay | Admitting: Family Medicine

## 2021-10-17 ENCOUNTER — Other Ambulatory Visit: Payer: Self-pay | Admitting: Family Medicine

## 2021-11-13 ENCOUNTER — Telehealth: Payer: Self-pay | Admitting: Family Medicine

## 2021-11-13 DIAGNOSIS — M170 Bilateral primary osteoarthritis of knee: Secondary | ICD-10-CM | POA: Diagnosis not present

## 2021-11-13 DIAGNOSIS — M2550 Pain in unspecified joint: Secondary | ICD-10-CM | POA: Diagnosis not present

## 2021-11-13 DIAGNOSIS — M0579 Rheumatoid arthritis with rheumatoid factor of multiple sites without organ or systems involvement: Secondary | ICD-10-CM | POA: Diagnosis not present

## 2021-11-13 DIAGNOSIS — M818 Other osteoporosis without current pathological fracture: Secondary | ICD-10-CM | POA: Diagnosis not present

## 2021-11-13 NOTE — Telephone Encounter (Signed)
Pt. Needs to be seen for this. Thanks, WS 

## 2021-11-13 NOTE — Telephone Encounter (Signed)
Left detailed voicemail

## 2021-12-20 ENCOUNTER — Telehealth: Payer: Self-pay | Admitting: Family Medicine

## 2021-12-20 NOTE — Telephone Encounter (Signed)
Patient aware he has appointment on 03/16 @ 9:40am.  ?

## 2021-12-28 ENCOUNTER — Ambulatory Visit: Payer: Federal, State, Local not specified - PPO | Admitting: Family Medicine

## 2021-12-28 ENCOUNTER — Encounter: Payer: Self-pay | Admitting: Family Medicine

## 2021-12-28 VITALS — BP 118/78 | HR 76 | Temp 97.5°F | Ht 65.0 in | Wt 202.0 lb

## 2021-12-28 DIAGNOSIS — E8881 Metabolic syndrome: Secondary | ICD-10-CM | POA: Diagnosis not present

## 2021-12-28 DIAGNOSIS — E039 Hypothyroidism, unspecified: Secondary | ICD-10-CM | POA: Diagnosis not present

## 2021-12-28 DIAGNOSIS — E785 Hyperlipidemia, unspecified: Secondary | ICD-10-CM | POA: Diagnosis not present

## 2021-12-28 LAB — TSH+FREE T4
Free T4: 0.97 ng/dL (ref 0.82–1.77)
TSH: 1.6 u[IU]/mL (ref 0.450–4.500)

## 2021-12-28 LAB — CBC WITH DIFFERENTIAL/PLATELET
Basophils Absolute: 0 10*3/uL (ref 0.0–0.2)
Basos: 0 %
EOS (ABSOLUTE): 0.1 10*3/uL (ref 0.0–0.4)
Eos: 2 %
Hematocrit: 44.4 % (ref 34.0–46.6)
Hemoglobin: 15.2 g/dL (ref 11.1–15.9)
Immature Grans (Abs): 0.1 10*3/uL (ref 0.0–0.1)
Immature Granulocytes: 1 %
Lymphocytes Absolute: 2.6 10*3/uL (ref 0.7–3.1)
Lymphs: 35 %
MCH: 31.5 pg (ref 26.6–33.0)
MCHC: 34.2 g/dL (ref 31.5–35.7)
MCV: 92 fL (ref 79–97)
Monocytes Absolute: 0.6 10*3/uL (ref 0.1–0.9)
Monocytes: 9 %
Neutrophils Absolute: 3.9 10*3/uL (ref 1.4–7.0)
Neutrophils: 53 %
Platelets: 246 10*3/uL (ref 150–450)
RBC: 4.83 x10E6/uL (ref 3.77–5.28)
RDW: 12.2 % (ref 11.7–15.4)
WBC: 7.3 10*3/uL (ref 3.4–10.8)

## 2021-12-28 LAB — LIPID PANEL
Chol/HDL Ratio: 5.3 ratio — ABNORMAL HIGH (ref 0.0–4.4)
Cholesterol, Total: 218 mg/dL — ABNORMAL HIGH (ref 100–199)
HDL: 41 mg/dL (ref >39)
LDL Chol Calc (NIH): 145 mg/dL — ABNORMAL HIGH (ref 0–99)
Triglycerides: 176 mg/dL — ABNORMAL HIGH (ref 0–149)
VLDL Cholesterol Cal: 32 mg/dL (ref 5–40)

## 2021-12-28 LAB — CMP14+EGFR
ALT: 35 IU/L — ABNORMAL HIGH (ref 0–32)
AST: 28 IU/L (ref 0–40)
Albumin/Globulin Ratio: 1.8 (ref 1.2–2.2)
Albumin: 4.4 g/dL (ref 3.8–4.9)
Alkaline Phosphatase: 72 IU/L (ref 44–121)
BUN/Creatinine Ratio: 19 (ref 9–23)
BUN: 14 mg/dL (ref 6–24)
Bilirubin Total: 0.5 mg/dL (ref 0.0–1.2)
CO2: 21 mmol/L (ref 20–29)
Calcium: 9.7 mg/dL (ref 8.7–10.2)
Chloride: 103 mmol/L (ref 96–106)
Creatinine, Ser: 0.73 mg/dL (ref 0.57–1.00)
Globulin, Total: 2.5 g/dL (ref 1.5–4.5)
Glucose: 93 mg/dL (ref 70–99)
Potassium: 4.1 mmol/L (ref 3.5–5.2)
Sodium: 138 mmol/L (ref 134–144)
Total Protein: 6.9 g/dL (ref 6.0–8.5)
eGFR: 99 mL/min/{1.73_m2} (ref >59)

## 2021-12-28 LAB — BAYER DCA HB A1C WAIVED: HB A1C (BAYER DCA - WAIVED): 5.3 % (ref 4.8–5.6)

## 2021-12-28 MED ORDER — GABAPENTIN 300 MG PO CAPS: 300.0000 mg | ORAL_CAPSULE | Freq: Every day | ORAL | 0 refills | Status: DC

## 2021-12-28 MED ORDER — OZEMPIC (1 MG/DOSE) 4 MG/3ML ~~LOC~~ SOPN
1.0000 mg | PEN_INJECTOR | SUBCUTANEOUS | 5 refills | Status: DC
Start: 2021-12-28 — End: 2022-02-06

## 2021-12-28 MED ORDER — MIRTAZAPINE 15 MG PO TABS: 15.0000 mg | ORAL_TABLET | Freq: Every day | ORAL | 2 refills | Status: DC

## 2021-12-28 NOTE — Progress Notes (Signed)
? ?Subjective:  ?Patient ID: Jean Hernandez, female    DOB: 12-13-1970  Age: 51 y.o. MRN: [id number] ? ?CC: Medical Management of Chronic Issues ? ? ?HPI ?Jean Hernandez presents for  follow-up on  metabolic syndrome. Treating with weight loss program. ?Weight loss has plateaued at 25 lb. Has regimen of walking daily. Feels hungry all the time. Drinking copious water. ? ?Sleeping poorly. Gabapentin not helping. Taking 300 BID. ? ?Depression screen [facility] 2/9 12/28/2021 04/27/2021 04/19/2020  ?Decreased Interest 0 0 0  ?Down, Depressed, Hopeless 0 0 0  ?PHQ - 2 Score 0 0 0  ?Altered sleeping - 3 -  ?Tired, decreased energy - 3 -  ?Change in appetite - 1 -  ?Feeling bad or failure about yourself  - 0 -  ?Trouble concentrating - 0 -  ?Moving slowly or fidgety/restless - 0 -  ?Suicidal thoughts - 0 -  ?PHQ-9 Score - 7 -  ?Difficult doing work/chores - Not difficult at all -  ? ? ?History ?Jean Hernandez has a past medical history of Anxiety, Asthma, GERD (gastroesophageal reflux disease), Hypothyroidism, Rheumatoid arthritis ([place]), and Sleep apnea.  ? ?She has a past surgical history that includes C sections; colonsocopy (2012); Abdominal hysterectomy; and Total knee arthroplasty (Left, 09/28/2019).  ? ?Her family history includes Allergies in her father, mother, and sister; Dementia in her father; Diabetes in her father; Early death in her father; Heart disease in her father; Hypertension in her mother.She reports that she has never smoked. She has never used smokeless tobacco. She reports that she does not drink alcohol and does not use drugs. ? ? ? ?ROS ?Review of Systems  ?Constitutional: Negative.   ?HENT: Negative.    ?Eyes:  Negative for visual disturbance.  ?Respiratory:  Negative for shortness of breath.   ?Cardiovascular:  Negative for chest pain.  ?Gastrointestinal:  Negative for abdominal pain.  ?Musculoskeletal:  Negative for arthralgias.  ? ?Objective:  ?BP 118/78   Pulse 76   Temp (!) 97.5 ?F (36.4 ?C)   Ht _0  (1.651 m)    Wt 202 lb (91.6 kg)   SpO2 94%   BMI 33.61 kg/m?  ? ?BP Readings from Last 3 Encounters:  ?12/28/21 118/78  ?04/27/21 (!) 139/93  ?02/13/21 (!) 156/98  ? ? ?Wt Readings from Last 3 Encounters:  ?12/28/21 202 lb (91.6 kg)  ?04/27/21 215 lb 12.8 oz (97.9 kg)  ?04/19/20 218 lb (98.9 kg)  ? ? ? ?Physical Exam ?Constitutional:   ?   General: She is not in acute distress. ?   Appearance: She is well-developed.  ?Cardiovascular:  ?   Rate and Rhythm: Normal rate and regular rhythm.  ?Pulmonary:  ?   Breath sounds: Normal breath sounds.  ?Musculoskeletal:     ?   General: Normal range of motion.  ?Skin: ?   General: Skin is warm and dry.  ?Neurological:  ?   Mental Status: She is alert and oriented to person, place, and time.  ? ? ? ? ?Assessment & Plan:  ? ?Jean Hernandez was seen today for medical management of chronic issues. ? ?Diagnoses and all orders for this visit: ? ?Metabolic syndrome ?-     Bayer DCA Hb A1c Waived ?-     CBC with Differential/Platelet ?-     CMP14+EGFR ?-     TSH ? ?Hypothyroidism, unspecified type ?-     TSH + free T4 ?-     TSH ? ?Hyperlipidemia, unspecified hyperlipidemia type ?-  Lipid panel ?-     TSH ? ?Other orders ?-     gabapentin (NEURONTIN) 300 MG capsule; Take 1 capsule (300 mg total) by mouth at bedtime. ?-     mirtazapine (REMERON) 15 MG tablet; Take 1 tablet (15 mg total) by mouth at bedtime. For sleep ?-     Semaglutide, 1 MG/DOSE, (OZEMPIC, 1 MG/DOSE,) 4 MG/3ML SOPN; Inject 1 mg into the skin once a week. ? ? ? ? ? ? ?I have discontinued Alannah Averhart. Weinberg's pantoprazole, cetirizine, diclofenac Sodium, and Ozempic (0.25 or 0.5 MG/DOSE). I have also changed her gabapentin. Additionally, I am having her start on mirtazapine and Ozempic (1 MG/DOSE). Lastly, I am having her maintain her Calcium Citrate (CITRACAL PO), certolizumab pegol, leflunomide, and Vitamin D-1000 Max St. ? ?Allergies as of 12/28/2021   ? ?   Reactions  ? Amoxicillin Itching, Rash  ? REACTION: hives  ? Other Itching  ?  Dilaudid  ? Oxycodone-acetaminophen Itching, Rash  ? Percocet [oxycodone-acetaminophen] Itching, Rash  ? Ultracet [tramadol-acetaminophen] Itching, Rash, Hives  ? REACTION: itching  ? Vicodin [hydrocodone-acetaminophen] Itching, Hives  ? Propoxyphene N-acetaminophen   ? REACTION: itching  ? ?  ? ?  ?Medication List  ?  ? ?  ? Accurate as of December 28, 2021 10:13 AM. If you have any questions, ask your nurse or doctor.  ?  ?  ? ?  ? ?STOP taking these medications   ? ?cetirizine 10 MG tablet ?Commonly known as: ZYRTEC ?Stopped by: Claretta Fraise, MD ?  ?diclofenac Sodium 1 % Gel ?Commonly known as: Voltaren ?Stopped by: Claretta Fraise, MD ?  ?Ozempic (0.25 or 0.5 MG/DOSE) 2 MG/1.5ML Sopn ?Generic drug: Semaglutide(0.25 or 0.5MG/DOS) ?Replaced by: Ozempic (1 MG/DOSE) 4 MG/3ML Sopn ?Stopped by: Claretta Fraise, MD ?  ?pantoprazole 40 MG tablet ?Commonly known as: PROTONIX ?Stopped by: Claretta Fraise, MD ?  ? ?  ? ?TAKE these medications   ? ?certolizumab pegol 2 X 200 MG Kit ?Commonly known as: CIMZIA ?Inject into the skin every 30 (thirty) days. ?  ?CITRACAL PO ?Take 1 tablet by mouth 2 (two) times daily. ?  ?gabapentin 300 MG capsule ?Commonly known as: NEURONTIN ?Take 1 capsule (300 mg total) by mouth at bedtime. ?What changed:  ?when to take this ?additional instructions ?Changed by: Claretta Fraise, MD ?  ?leflunomide 20 MG tablet ?Commonly known as: ARAVA ?On tablet ?What changed:  ?how much to take ?how to take this ?when to take this ?additional instructions ?  ?mirtazapine 15 MG tablet ?Commonly known as: REMERON ?Take 1 tablet (15 mg total) by mouth at bedtime. For sleep ?Started by: Claretta Fraise, MD ?  ?Ozempic (1 MG/DOSE) 4 MG/3ML Sopn ?Generic drug: Semaglutide (1 MG/DOSE) ?Inject 1 mg into the skin once a week. ?Replaces: Ozempic (0.25 or 0.5 MG/DOSE) 2 MG/1.5ML Sopn ?Started by: Claretta Fraise, MD ?  ?Vitamin D-1000 Max St 25 MCG (1000 UT) tablet ?Generic drug: Cholecalciferol ?Take 1,000 Units by mouth daily. ?   ? ?  ? ? ? ?Follow-up: Return in about 1 month (around 01/28/2022). ? ?Claretta Fraise, M.D. ?

## 2022-01-17 DIAGNOSIS — Z01419 Encounter for gynecological examination (general) (routine) without abnormal findings: Secondary | ICD-10-CM | POA: Diagnosis not present

## 2022-01-17 DIAGNOSIS — Z13820 Encounter for screening for osteoporosis: Secondary | ICD-10-CM | POA: Diagnosis not present

## 2022-01-17 DIAGNOSIS — Z1231 Encounter for screening mammogram for malignant neoplasm of breast: Secondary | ICD-10-CM | POA: Diagnosis not present

## 2022-01-17 DIAGNOSIS — Z1272 Encounter for screening for malignant neoplasm of vagina: Secondary | ICD-10-CM | POA: Diagnosis not present

## 2022-01-17 DIAGNOSIS — N952 Postmenopausal atrophic vaginitis: Secondary | ICD-10-CM | POA: Diagnosis not present

## 2022-01-17 DIAGNOSIS — M810 Age-related osteoporosis without current pathological fracture: Secondary | ICD-10-CM | POA: Diagnosis not present

## 2022-01-17 LAB — HM DEXA SCAN

## 2022-02-06 ENCOUNTER — Ambulatory Visit: Payer: Federal, State, Local not specified - PPO | Admitting: Family Medicine

## 2022-02-06 ENCOUNTER — Encounter: Payer: Self-pay | Admitting: Family Medicine

## 2022-02-06 VITALS — BP 136/75 | HR 104 | Temp 97.6°F | Ht 65.0 in | Wt 198.4 lb

## 2022-02-06 DIAGNOSIS — E8881 Metabolic syndrome: Secondary | ICD-10-CM | POA: Diagnosis not present

## 2022-02-06 MED ORDER — SEMAGLUTIDE (2 MG/DOSE) 8 MG/3ML ~~LOC~~ SOPN: 2.0000 mg | PEN_INJECTOR | SUBCUTANEOUS | 3 refills | Status: DC

## 2022-02-06 NOTE — Progress Notes (Signed)
? ?Subjective:  ?Patient ID: Jean Hernandez, female    DOB: September 24, 1971  Age: 51 y.o. MRN: [id number] ? ?CC: Follow-up (Metabolic syndrome) ? ? ?HPI ?Jean Hernandez presents for weight not coming down. Appetite control is variable.  Some days are better than others.  She is working on exercising on the treadmill 30 minutes a day.  She is thinking about increasing the time as well.  The treadmill is available for her lunch while she is at work. ? ? ?  02/06/2022  ?  4:19 PM 02/06/2022  ?  4:14 PM 12/28/2021  ?  9:30 AM  ?Depression screen PHQ 2/9  ?Decreased Interest 0 0 0  ?Down, Depressed, Hopeless 0 0 0  ?PHQ - 2 Score 0 0 0  ?Altered sleeping 3    ?Tired, decreased energy 1    ?Change in appetite 1    ?Feeling bad or failure about yourself  0    ?Trouble concentrating 0    ?Moving slowly or fidgety/restless 0    ?Suicidal thoughts 0    ?PHQ-9 Score 5    ?Difficult doing work/chores Not difficult at all    ? ? ?History ?Jean Hernandez has a past medical history of Anxiety, Asthma, GERD (gastroesophageal reflux disease), Hypothyroidism, Rheumatoid arthritis ([place]), and Sleep apnea.  ? ?She has a past surgical history that includes C sections; colonsocopy (2012); Abdominal hysterectomy; and Total knee arthroplasty (Left, 09/28/2019).  ? ?Her family history includes Allergies in her father, mother, and sister; Dementia in her father; Diabetes in her father; Early death in her father; Heart disease in her father; Hypertension in her mother.She reports that she has never smoked. She has never used smokeless tobacco. She reports that she does not drink alcohol and does not use drugs. ? ? ? ?ROS ?Review of Systems ? ?Objective:  ?BP 136/75   Pulse (!) 104   Temp 97.6 ?F (36.4 ?C)   Ht $R'5\' 5"'Tc$  (1.651 m)   Wt 198 lb 6.4 oz (90 kg)   SpO2 94%   BMI 33.02 kg/m?  ? ?BP Readings from Last 3 Encounters:  ?02/06/22 136/75  ?12/28/21 118/78  ?04/27/21 (!) 139/93  ? ? ?Wt Readings from Last 3 Encounters:  ?02/06/22 198 lb 6.4 oz (90 kg)   ?12/28/21 202 lb (91.6 kg)  ?04/27/21 215 lb 12.8 oz (97.9 kg)  ? ? ? ?Physical Exam ?Constitutional:   ?   General: She is not in acute distress. ?   Appearance: She is well-developed.  ?Cardiovascular:  ?   Rate and Rhythm: Normal rate and regular rhythm.  ?Pulmonary:  ?   Breath sounds: Normal breath sounds.  ?Musculoskeletal:     ?   General: Normal range of motion.  ?Skin: ?   General: Skin is warm and dry.  ?Neurological:  ?   Mental Status: She is alert and oriented to person, place, and time.  ? ? ? ? ?Assessment & Plan:  ? ?Mickenzie was seen today for follow-up. ? ?Diagnoses and all orders for this visit: ? ?Metabolic syndrome ? ?Other orders ?-     Semaglutide, 2 MG/DOSE, 8 MG/3ML SOPN; Inject 2 mg as directed once a week. ? ? ? ? ? ? ?I have discontinued Jyra Lagares. Garcon's Ozempic (1 MG/DOSE). I am also having her start on Semaglutide (2 MG/DOSE). Additionally, I am having her maintain her Calcium Citrate (CITRACAL PO), certolizumab pegol, leflunomide, Vitamin D-1000 Max St, gabapentin, and mirtazapine. ? ?Allergies as of 02/06/2022   ? ?  Reactions  ? Amoxicillin Itching, Rash  ? REACTION: hives  ? Other Itching  ? Dilaudid  ? Oxycodone-acetaminophen Itching, Rash  ? Percocet [oxycodone-acetaminophen] Itching, Rash  ? Ultracet [tramadol-acetaminophen] Itching, Rash, Hives  ? REACTION: itching  ? Vicodin [hydrocodone-acetaminophen] Itching, Hives  ? Propoxyphene N-acetaminophen   ? REACTION: itching  ? ?  ? ?  ?Medication List  ?  ? ?  ? Accurate as of February 06, 2022  5:15 PM. If you have any questions, ask your nurse or doctor.  ?  ?  ? ?  ? ?STOP taking these medications   ? ?Ozempic (1 MG/DOSE) 4 MG/3ML Sopn ?Generic drug: Semaglutide (1 MG/DOSE) ?Replaced by: Semaglutide (2 MG/DOSE) 8 MG/3ML Sopn ?Stopped by: Claretta Fraise, MD ?  ? ?  ? ?TAKE these medications   ? ?certolizumab pegol 2 X 200 MG Kit ?Commonly known as: CIMZIA ?Inject into the skin every 30 (thirty) days. ?  ?CITRACAL PO ?Take 1 tablet by  mouth 2 (two) times daily. ?  ?gabapentin 300 MG capsule ?Commonly known as: NEURONTIN ?Take 1 capsule (300 mg total) by mouth at bedtime. ?  ?leflunomide 20 MG tablet ?Commonly known as: ARAVA ?On tablet ?What changed:  ?how much to take ?how to take this ?when to take this ?additional instructions ?  ?mirtazapine 15 MG tablet ?Commonly known as: REMERON ?Take 1 tablet (15 mg total) by mouth at bedtime. For sleep ?  ?Semaglutide (2 MG/DOSE) 8 MG/3ML Sopn ?Inject 2 mg as directed once a week. ?Replaces: Ozempic (1 MG/DOSE) 4 MG/3ML Sopn ?Started by: Claretta Fraise, MD ?  ?Vitamin D-1000 Max St 25 MCG (1000 UT) tablet ?Generic drug: Cholecalciferol ?Take 1,000 Units by mouth daily. ?  ? ?  ? ?Encouraged regular exercise, just be sure is sustainable after the end of the diet.  Continue looking for ways to cut back on calories and carbs in the diet.  And did point out that she is losing weight.  Maybe not as fast as she would like to but it is in a reasonable fashion. ? ?Follow-up: Return in about 3 months (around 05/08/2022). ? ?Claretta Fraise, M.D. ?

## 2022-03-09 ENCOUNTER — Encounter: Payer: Self-pay | Admitting: Family Medicine

## 2022-03-20 MED ORDER — TIRZEPATIDE 7.5 MG/0.5ML ~~LOC~~ SOAJ: 7.5000 mg | SUBCUTANEOUS | 0 refills | Status: DC

## 2022-05-03 ENCOUNTER — Other Ambulatory Visit: Payer: Self-pay | Admitting: Family Medicine

## 2022-05-03 NOTE — Telephone Encounter (Signed)
Last office visit 02/06/22 Upcoming appointment 05/21/22 Last refill 12/28/21, #30, 2 refills

## 2022-05-11 DIAGNOSIS — M2550 Pain in unspecified joint: Secondary | ICD-10-CM | POA: Diagnosis not present

## 2022-05-11 DIAGNOSIS — M170 Bilateral primary osteoarthritis of knee: Secondary | ICD-10-CM | POA: Diagnosis not present

## 2022-05-11 DIAGNOSIS — M0579 Rheumatoid arthritis with rheumatoid factor of multiple sites without organ or systems involvement: Secondary | ICD-10-CM | POA: Diagnosis not present

## 2022-05-11 DIAGNOSIS — M818 Other osteoporosis without current pathological fracture: Secondary | ICD-10-CM | POA: Diagnosis not present

## 2022-05-14 ENCOUNTER — Other Ambulatory Visit: Payer: Self-pay | Admitting: Family Medicine

## 2022-05-14 MED ORDER — DIETHYLPROPION HCL 25 MG PO TABS: 25.0000 mg | ORAL_TABLET | Freq: Three times a day (TID) | ORAL | 2 refills | Status: DC

## 2022-05-16 NOTE — Telephone Encounter (Signed)
She can come in after she has been on the tenuate for a month

## 2022-05-18 DIAGNOSIS — M818 Other osteoporosis without current pathological fracture: Secondary | ICD-10-CM | POA: Diagnosis not present

## 2022-05-21 ENCOUNTER — Ambulatory Visit: Payer: Federal, State, Local not specified - PPO | Admitting: Family Medicine

## 2022-06-11 ENCOUNTER — Encounter: Payer: Self-pay | Admitting: Family Medicine

## 2022-06-11 ENCOUNTER — Ambulatory Visit: Payer: Federal, State, Local not specified - PPO | Admitting: Family Medicine

## 2022-08-07 ENCOUNTER — Encounter: Payer: Self-pay | Admitting: Family Medicine

## 2022-08-10 DIAGNOSIS — R053 Chronic cough: Secondary | ICD-10-CM | POA: Diagnosis not present

## 2022-08-10 DIAGNOSIS — Z1211 Encounter for screening for malignant neoplasm of colon: Secondary | ICD-10-CM | POA: Diagnosis not present

## 2022-08-10 DIAGNOSIS — K219 Gastro-esophageal reflux disease without esophagitis: Secondary | ICD-10-CM | POA: Diagnosis not present

## 2022-08-17 DIAGNOSIS — M79601 Pain in right arm: Secondary | ICD-10-CM | POA: Diagnosis not present

## 2022-08-17 DIAGNOSIS — M79671 Pain in right foot: Secondary | ICD-10-CM | POA: Diagnosis not present

## 2022-08-29 ENCOUNTER — Institutional Professional Consult (permissible substitution): Payer: Federal, State, Local not specified - PPO | Admitting: Internal Medicine

## 2022-09-24 ENCOUNTER — Encounter: Payer: Self-pay | Admitting: Family Medicine

## 2022-09-24 ENCOUNTER — Ambulatory Visit: Payer: Federal, State, Local not specified - PPO | Admitting: Family Medicine

## 2022-09-24 VITALS — BP 125/73 | HR 110 | Ht 65.0 in | Wt 220.0 lb

## 2022-09-24 DIAGNOSIS — J40 Bronchitis, not specified as acute or chronic: Secondary | ICD-10-CM | POA: Diagnosis not present

## 2022-09-24 MED ORDER — CEFDINIR 300 MG PO CAPS: 300.0000 mg | ORAL_CAPSULE | Freq: Two times a day (BID) | ORAL | 0 refills | Status: DC

## 2022-09-24 MED ORDER — ALBUTEROL SULFATE HFA 108 (90 BASE) MCG/ACT IN AERS: 2.0000 | INHALATION_SPRAY | Freq: Four times a day (QID) | RESPIRATORY_TRACT | 0 refills | Status: DC | PRN

## 2022-09-24 NOTE — Progress Notes (Signed)
BP 125/73   Pulse (!) 110   Ht _0  (1.651 m)   Wt 220 lb (99.8 kg)   SpO2 96%   BMI 36.61 kg/m    Subjective:   Patient ID: Jean Hernandez, female    DOB: November 04, 1970, 51 y.o.   MRN: [id number]  HPI: Jean Hernandez is a 50 y.o. female presenting on 09/24/2022 for Cough and head congestion   HPI Cough and head congestion Patient is coming today for cough and head congestion and chest congestion that has been going on for a few days but now has worsened and is going down in her chest.  She normally has a little bit of a cough which is cough syndrome that she has had but this is started to get down into her chest and she has had a productive cough of sputum so she knows it is more than just her normal cough syndrome.  She has been using Nasacort and Tessalon Perles that she was given in an ED visit or televisit.  She is also been using some Mucinex to help as well and it does not seem to be clearing.  Relevant past medical, surgical, family and social history reviewed and updated as indicated. Interim medical history since our last visit reviewed. Allergies and medications reviewed and updated.  Review of Systems  Constitutional:  Negative for chills and fever.  HENT:  Positive for congestion, postnasal drip, rhinorrhea and sinus pressure. Negative for ear discharge, ear pain, sneezing and sore throat.   Eyes:  Negative for pain, redness and visual disturbance.  Respiratory:  Positive for cough and wheezing. Negative for chest tightness and shortness of breath.   Cardiovascular:  Negative for chest pain and leg swelling.  Genitourinary:  Negative for difficulty urinating and dysuria.  Musculoskeletal:  Negative for back pain and gait problem.  Skin:  Negative for rash.  Neurological:  Negative for light-headedness and headaches.  Psychiatric/Behavioral:  Negative for agitation and behavioral problems.   All other systems reviewed and are negative.   Per HPI unless specifically  indicated above   Allergies as of 09/24/2022       Reactions   Amoxicillin Itching, Rash   REACTION: hives   Other Itching   Dilaudid   Oxycodone-acetaminophen Itching, Rash   Percocet [oxycodone-acetaminophen] Itching, Rash   Ultracet [tramadol-acetaminophen] Itching, Rash, Hives   REACTION: itching   Vicodin [hydrocodone-acetaminophen] Itching, Hives   Propoxyphene N-acetaminophen    REACTION: itching        Medication List        Accurate as of September 24, 2022 11:37 AM. If you have any questions, ask your nurse or doctor.          STOP taking these medications    Diethylpropion HCl 25 MG Tabs Stopped by: Fransisca Kaufmann Callista Hoh, MD   tirzepatide 7.5 MG/0.5ML Pen Commonly known as: MOUNJARO Stopped by: Worthy Rancher, MD       TAKE these medications    albuterol 108 (90 Base) MCG/ACT inhaler Commonly known as: VENTOLIN HFA Inhale 2 puffs into the lungs every 6 (six) hours as needed for wheezing or shortness of breath. Started by: Fransisca Kaufmann Jorgen Wolfinger, MD   benzonatate 200 MG capsule Commonly known as: TESSALON Take 200 mg by mouth 3 (three) times daily as needed.   cefdinir 300 MG capsule Commonly known as: OMNICEF Take 1 capsule (300 mg total) by mouth 2 (two) times daily. 1 po BID Started by: Fransisca Kaufmann Rylie Limburg,  MD   certolizumab pegol 2 X 200 MG Kit Commonly known as: CIMZIA Inject into the skin every 30 (thirty) days.   CITRACAL PO Take 1 tablet by mouth 2 (two) times daily.   dextromethorphan-guaiFENesin 30-600 MG 12hr tablet Commonly known as: MUCINEX DM Take 1 tablet by mouth 2 (two) times daily.   fluticasone 50 MCG/ACT nasal spray Commonly known as: FLONASE Place 1 spray into both nostrils daily.   gabapentin 300 MG capsule Commonly known as: NEURONTIN Take 1 capsule (300 mg total) by mouth at bedtime.   ipratropium 0.06 % nasal spray Commonly known as: ATROVENT Place 1 spray into both nostrils 3 (three) times daily.    leflunomide 20 MG tablet Commonly known as: ARAVA On tablet What changed:  how much to take how to take this when to take this additional instructions   mirtazapine 15 MG tablet Commonly known as: REMERON TAKE ONE TABLET BY MOUTH AT BEDTIME FOR SLEEP   Nasacort Allergy 24HR 55 MCG/ACT Aero nasal inhaler Generic drug: triamcinolone Place 2 sprays into the nose daily.   Vitamin D-1000 Max St 25 MCG (1000 UT) tablet Generic drug: Cholecalciferol Take 1,000 Units by mouth daily.         Objective:   BP 125/73   Pulse (!) 110   Ht _0  (1.651 m)   Wt 220 lb (99.8 kg)   SpO2 96%   BMI 36.61 kg/m   Wt Readings from Last 3 Encounters:  09/24/22 220 lb (99.8 kg)  02/06/22 198 lb 6.4 oz (90 kg)  12/28/21 202 lb (91.6 kg)    Physical Exam Vitals reviewed.  Constitutional:      General: She is not in acute distress.    Appearance: She is well-developed. She is not diaphoretic.  HENT:     Right Ear: Tympanic membrane, ear canal and external ear normal.     Left Ear: Tympanic membrane, ear canal and external ear normal.     Nose: Mucosal edema and rhinorrhea present.     Right Sinus: No maxillary sinus tenderness or frontal sinus tenderness.     Left Sinus: No maxillary sinus tenderness or frontal sinus tenderness.     Mouth/Throat:     Mouth: Mucous membranes are moist.     Pharynx: Uvula midline. Posterior oropharyngeal erythema present. No oropharyngeal exudate.     Tonsils: No tonsillar abscesses.  Eyes:     Conjunctiva/sclera: Conjunctivae normal.  Cardiovascular:     Rate and Rhythm: Normal rate and regular rhythm.     Heart sounds: Normal heart sounds. No murmur heard. Pulmonary:     Effort: Pulmonary effort is normal. No respiratory distress.     Breath sounds: Normal breath sounds. No wheezing, rhonchi or rales.  Chest:     Chest wall: No tenderness.  Musculoskeletal:        General: No tenderness. Normal range of motion.  Skin:    General: Skin is  warm and dry.     Findings: No rash.  Neurological:     Mental Status: She is alert and oriented to person, place, and time.     Coordination: Coordination normal.  Psychiatric:        Behavior: Behavior normal.       Assessment & Plan:   Problem List Items Addressed This Visit   None Visit Diagnoses     Bronchitis    -  Primary   Relevant Medications   cefdinir (OMNICEF) 300 MG capsule   albuterol (  VENTOLIN HFA) 108 (90 Base) MCG/ACT inhaler     Will give cefdinir and albuterol and she is to continue the home remedies that she is doing currently.  Follow up plan: Return if symptoms worsen or fail to improve.  Counseling provided for all of the vaccine components No orders of the defined types were placed in this encounter.   Caryl Pina, MD [facility] 09/24/2022, 11:37 AM

## 2022-09-25 ENCOUNTER — Encounter: Payer: Self-pay | Admitting: Family Medicine

## 2022-09-26 ENCOUNTER — Telehealth: Payer: Self-pay | Admitting: Family Medicine

## 2022-09-26 MED ORDER — MOLNUPIRAVIR EUA 200MG CAPSULE: 4.0000 | ORAL_CAPSULE | Freq: Two times a day (BID) | ORAL | 0 refills | Status: AC

## 2022-09-26 NOTE — Telephone Encounter (Signed)
Patient took an at home COVID test on 12/13 and it was positive. Seen in office 12/11 and was told she had bronchitis. Would like to know if there is anything else she can do to treat.

## 2022-09-26 NOTE — Telephone Encounter (Signed)
Sent COVID medicine for her.  She can keep using the things she is using but she can also use the COVID medicine.

## 2022-09-26 NOTE — Telephone Encounter (Signed)
Patient aware.

## 2022-09-28 ENCOUNTER — Telehealth: Payer: Self-pay | Admitting: Family Medicine

## 2022-09-28 MED ORDER — PREDNISONE 20 MG PO TABS: ORAL_TABLET | ORAL | 0 refills | Status: DC

## 2022-09-28 NOTE — Telephone Encounter (Signed)
PATIENT AWARE OF RECOMMENDATIONS. Does want me to let you know that her esophagus is so sore once again. She states it in not her throat its deep down her throat and her chest is so sore from coughing. Any recommendations?

## 2022-09-28 NOTE — Telephone Encounter (Signed)
If she feels like she is getting some acid reflux and that is what causing her esophagus to be sore then she could also take some over-the-counter Pepcid AC twice a day.  Other than that, I think it is just going to give a little time and use the steroids.  Make sure she does the steroids with food

## 2022-09-28 NOTE — Telephone Encounter (Signed)
Pt informed and understood all. She will call back next week if she does not feel any better.

## 2022-09-28 NOTE — Telephone Encounter (Signed)
There are no interactions with all of that, if the [employer] are not helping her then she does not have to take them but I would take the rest of the others

## 2022-09-28 NOTE — Telephone Encounter (Signed)
Pt called to let Dr Dettinger know that she still has a lot of congestion that is not breaking up. Wants to know if he can send something stronger in for her to take? Says she's taking mucinex but its not helping at all.

## 2022-09-28 NOTE — Telephone Encounter (Signed)
Pt aware prednisone was sent over States she is taking mucinex, tessalon pearls, molnupiravir, cefdinir and now prednisone. Should she be taking all of this?    States her throat does not hurt but her esophagus is sore from coughing.

## 2022-09-28 NOTE — Telephone Encounter (Signed)
Prednisone or steroids would be the only thing that I could think of that would be stronger that she is not already taking.  Sent in prescription for prednisone.  It is also just going to take some time for her to fully get over this and she needs to continue with her inhalers as well.

## 2022-10-01 ENCOUNTER — Encounter: Payer: Self-pay | Admitting: Family Medicine

## 2022-10-16 ENCOUNTER — Telehealth: Payer: Federal, State, Local not specified - PPO | Admitting: Urgent Care

## 2022-10-16 DIAGNOSIS — U099 Post covid-19 condition, unspecified: Secondary | ICD-10-CM | POA: Diagnosis not present

## 2022-10-16 DIAGNOSIS — R051 Acute cough: Secondary | ICD-10-CM

## 2022-10-16 MED ORDER — DEXAMETHASONE 6 MG PO TABS: 6.0000 mg | ORAL_TABLET | Freq: Every day | ORAL | 0 refills | Status: AC

## 2022-10-16 MED ORDER — AZITHROMYCIN 250 MG PO TABS: ORAL_TABLET | ORAL | 0 refills | Status: AC

## 2022-10-16 MED ORDER — BENZONATATE 200 MG PO CAPS: 200.0000 mg | ORAL_CAPSULE | Freq: Two times a day (BID) | ORAL | 0 refills | Status: DC | PRN

## 2022-10-16 NOTE — Progress Notes (Signed)
Virtual Visit Consent   MAIRE GOVAN, you are scheduled for a virtual visit with a [facility] provider today. Just as with appointments in the office, your consent must be obtained to participate. Your consent will be active for this visit and any virtual visit you may have with one of our providers in the next 365 days. If you have a MyChart account, a copy of this consent can be sent to you electronically.  As this is a virtual visit, video technology does not allow for your provider to perform a traditional examination. This may limit your provider's ability to fully assess your condition. If your provider identifies any concerns that need to be evaluated in person or the need to arrange testing (such as labs, EKG, etc.), we will make arrangements to do so. Although advances in technology are sophisticated, we cannot ensure that it will always work on either your end or our end. If the connection with a video visit is poor, the visit may have to be switched to a telephone visit. With either a video or telephone visit, we are not always able to ensure that we have a secure connection.  By engaging in this virtual visit, you consent to the provision of healthcare and authorize for your insurance to be billed (if applicable) for the services provided during this visit. Depending on your insurance coverage, you may receive a charge related to this service.  I need to obtain your verbal consent now. Are you willing to proceed with your visit today? AAYLA MARROCCO has provided verbal consent on 10/16/2022 for a virtual visit (video or telephone). Chaney Malling, PA  Date: 10/16/2022 8:21 PM  Virtual Visit via Video Note   I, [place], connected with  MARIONNA GONIA  ([id number], 09-04-1971) on 10/16/22 at  8:15 PM EST by a video-enabled telemedicine application and verified that I am speaking with the correct person using two identifiers.  Location: Patient: Virtual Visit Location Patient:  Home Provider: Virtual Visit Location Provider: Home Office   I discussed the limitations of evaluation and management by telemedicine and the availability of in person appointments. The patient expressed understanding and agreed to proceed.    History of Present Illness: Jean Hernandez is a 52 y.o. who identifies as a female who was assigned female at birth, and is being seen today for cough.  HPI: 52yo with chronic upper airway cough syndrome presents today for cough since 09/23/22. Was seen on the 11th via a telephone visit and was dx with sinusitis. States something was called (doesn't remember what), but woke up the next morning much worse. Went in to see her PCP on 09/25/22, Was dx with bronchitis, and prescribed cefdinir and albuterol and tessalon pearles. Pt reported feeling even worse the following day 09/26/22, and took a home covid test which was positive. Pt admits to a chronic throat clearing like cough prior to having covid, but states that she is still having a harsh cough. Intermittent mucous production. Her normal medications are not working. Denies CP or palpitations, does feel intermittently tight but no SOB. Took a round of prednisone and molnupiravir around the 13th, states it seemed to help significantly, but then came back once the course was completed. Denies fevers. No additional sx.   Problems:  Patient Active Problem List   Diagnosis Date Noted   Hypothyroidism 40/98/1191   Metabolic syndrome 47/82/9562   OA (osteoarthritis) of knee 09/28/2019   Rheumatoid arthritis involving multiple sites (  [place]) 07/19/2017   Upper airway cough syndrome 07/18/2017   Hyperlipidemia 08/06/2008   ANAL FISSURE 08/06/2008   RECTAL BLEEDING 08/06/2008   SKIN TAG 08/06/2008    Allergies:  Allergies  Allergen Reactions   Amoxicillin Itching and Rash    REACTION: hives   Other Itching    Dilaudid   Oxycodone-Acetaminophen Itching and Rash   Percocet [Oxycodone-Acetaminophen] Itching  and Rash   Ultracet [Tramadol-Acetaminophen] Itching, Rash and Hives    REACTION: itching   Vicodin [Hydrocodone-Acetaminophen] Itching and Hives   Propoxyphene N-Acetaminophen     REACTION: itching   Medications:  Current Outpatient Medications:    azithromycin (ZITHROMAX) 250 MG tablet, Take 2 tablets on day 1, then 1 tablet daily on days 2 through 5, Disp: 6 tablet, Rfl: 0   benzonatate (TESSALON) 200 MG capsule, Take 1 capsule (200 mg total) by mouth 2 (two) times daily as needed for cough., Disp: 20 capsule, Rfl: 0   dexamethasone (DECADRON) 6 MG tablet, Take 1 tablet (6 mg total) by mouth daily for 5 days., Disp: 5 tablet, Rfl: 0   albuterol (VENTOLIN HFA) 108 (90 Base) MCG/ACT inhaler, Inhale 2 puffs into the lungs every 6 (six) hours as needed for wheezing or shortness of breath., Disp: 8 g, Rfl: 0   Calcium Citrate (CITRACAL PO), Take 1 tablet by mouth 2 (two) times daily., Disp: , Rfl:    Certolizumab Pegol 2 X 200 MG KIT, Inject into the skin every 30 (thirty) days. , Disp: , Rfl:    Cholecalciferol (VITAMIN D-1000 MAX ST) 25 MCG (1000 UT) tablet, Take 1,000 Units by mouth daily., Disp: , Rfl:    dextromethorphan-guaiFENesin (MUCINEX DM) 30-600 MG 12hr tablet, Take 1 tablet by mouth 2 (two) times daily., Disp: , Rfl:    ipratropium (ATROVENT) 0.06 % nasal spray, Place 1 spray into both nostrils 3 (three) times daily., Disp: , Rfl:    mirtazapine (REMERON) 15 MG tablet, TAKE ONE TABLET BY MOUTH AT BEDTIME FOR SLEEP, Disp: 30 tablet, Rfl: 2   triamcinolone (NASACORT ALLERGY 24HR) 55 MCG/ACT AERO nasal inhaler, Place 2 sprays into the nose daily., Disp: , Rfl:   Observations/Objective: Patient is well-developed, well-nourished in no acute distress.  Resting comfortably at home. Non-toxic Head is normocephalic, atraumatic.  No labored breathing. Intermittent harsh dry cough Speech is clear and coherent with logical content.  Patient is alert and oriented at baseline.  No  rhinorrhea  Assessment and Plan: 1. Post-acute COVID-19 syndrome  2. Acute cough  Pt with a chronic cough, recently worsened due to being post-acute Covid on 09/26/22. Pt had mild improvement on prednisone and molnupiravir which favors this as being a viral, inflammatory cough. She denies red flag s/sx at this time. Will therefore start patient on dexamethasone once daily in combo with azithromycin. Pt also requesting refill of tessalon as this has been helpful with her chronic cough.  Follow Up Instructions: I discussed the assessment and treatment plan with the patient. The patient was provided an opportunity to ask questions and all were answered. The patient agreed with the plan and demonstrated an understanding of the instructions.  A copy of instructions were sent to the patient via MyChart unless otherwise noted below.     The patient was advised to call back or seek an in-person evaluation if the symptoms worsen or if the condition fails to improve as anticipated.  Time:  I spent 10 minutes with the patient via telehealth technology discussing the above problems/concerns.  [place], PA

## 2022-10-16 NOTE — Patient Instructions (Signed)
Jean Hernandez, thank you for joining Chaney Malling, PA for today's virtual visit.  While this provider is not your primary care provider (PCP), if your PCP is located in our provider database this encounter information will be shared with them immediately following your visit.   [facility] account gives you access to today's visit and all your visits, tests, and labs performed at [facility] " click here if you don't have a [facility] account or go to [email or web address]  Consent: (Patient) Jean Hernandez provided verbal consent for this virtual visit at the beginning of the encounter.  Current Medications:  Current Outpatient Medications:    azithromycin (ZITHROMAX) 250 MG tablet, Take 2 tablets on day 1, then 1 tablet daily on days 2 through 5, Disp: 6 tablet, Rfl: 0   benzonatate (TESSALON) 200 MG capsule, Take 1 capsule (200 mg total) by mouth 2 (two) times daily as needed for cough., Disp: 20 capsule, Rfl: 0   dexamethasone (DECADRON) 6 MG tablet, Take 1 tablet (6 mg total) by mouth daily for 5 days., Disp: 5 tablet, Rfl: 0   albuterol (VENTOLIN HFA) 108 (90 Base) MCG/ACT inhaler, Inhale 2 puffs into the lungs every 6 (six) hours as needed for wheezing or shortness of breath., Disp: 8 g, Rfl: 0   Calcium Citrate (CITRACAL PO), Take 1 tablet by mouth 2 (two) times daily., Disp: , Rfl:    Certolizumab Pegol 2 X 200 MG KIT, Inject into the skin every 30 (thirty) days. , Disp: , Rfl:    Cholecalciferol (VITAMIN D-1000 MAX ST) 25 MCG (1000 UT) tablet, Take 1,000 Units by mouth daily., Disp: , Rfl:    dextromethorphan-guaiFENesin (MUCINEX DM) 30-600 MG 12hr tablet, Take 1 tablet by mouth 2 (two) times daily., Disp: , Rfl:    ipratropium (ATROVENT) 0.06 % nasal spray, Place 1 spray into both nostrils 3 (three) times daily., Disp: , Rfl:    mirtazapine (REMERON) 15 MG tablet, TAKE ONE TABLET BY MOUTH AT BEDTIME FOR SLEEP, Disp: 30 tablet, Rfl: 2   triamcinolone  (NASACORT ALLERGY 24HR) 55 MCG/ACT AERO nasal inhaler, Place 2 sprays into the nose daily., Disp: , Rfl:    Medications ordered in this encounter:  Meds ordered this encounter  Medications   dexamethasone (DECADRON) 6 MG tablet    Sig: Take 1 tablet (6 mg total) by mouth daily for 5 days.    Dispense:  5 tablet    Refill:  0    Order Specific Question:   Supervising Provider    Answer:   Chase Picket [[id number]]   azithromycin (ZITHROMAX) 250 MG tablet    Sig: Take 2 tablets on day 1, then 1 tablet daily on days 2 through 5    Dispense:  6 tablet    Refill:  0    Order Specific Question:   Supervising Provider    Answer:   Chase Picket [[id number]]   benzonatate (TESSALON) 200 MG capsule    Sig: Take 1 capsule (200 mg total) by mouth 2 (two) times daily as needed for cough.    Dispense:  20 capsule    Refill:  0    Order Specific Question:   Supervising Provider    Answer:   Chase Picket [id number]     *If you need refills on other medications prior to your next appointment, please contact your pharmacy*  Follow-Up: Call back or seek an in-person evaluation if the symptoms worsen or  if the condition fails to improve as anticipated.  [facility] [phone]  Other Instructions I suspect your cough to be inflammatory secondary to your recent covid infection. Please start taking Dexamethasone once daily with breakfast until gone. Do NOT take your prednisone in addition to this medication. Take the azithromycin per package directions. You may continue your mucinex and tessalon as needed. Rest and drink plenty of water. Cool mist humidifier and exposure to the cold air from your freezer may also help with your upper airway. If you develop severe shortness of breath or chest pain, please go to your nearest emergency room.   If you have been instructed to have an in-person evaluation today at a local Urgent Care facility, please use the link below. It will  take you to a list of all of our available [facility], including address, phone number and hours of operation. Please do not delay care.  [facility]  If you or a family member do not have a primary care provider, use the link below to schedule a visit and establish care. When you choose a [facility] primary care physician or advanced practice provider, you gain a long-term partner in health. Find a Primary Care Provider  Learn more about [facility]'s in-office and virtual care options: [facility]

## 2022-10-22 ENCOUNTER — Other Ambulatory Visit: Payer: Self-pay | Admitting: Family

## 2022-10-31 DIAGNOSIS — Z79899 Other long term (current) drug therapy: Secondary | ICD-10-CM | POA: Diagnosis not present

## 2022-10-31 DIAGNOSIS — M0579 Rheumatoid arthritis with rheumatoid factor of multiple sites without organ or systems involvement: Secondary | ICD-10-CM | POA: Diagnosis not present

## 2022-10-31 DIAGNOSIS — M1991 Primary osteoarthritis, unspecified site: Secondary | ICD-10-CM | POA: Diagnosis not present

## 2022-10-31 DIAGNOSIS — M818 Other osteoporosis without current pathological fracture: Secondary | ICD-10-CM | POA: Diagnosis not present

## 2022-11-09 ENCOUNTER — Ambulatory Visit (INDEPENDENT_AMBULATORY_CARE_PROVIDER_SITE_OTHER): Payer: Federal, State, Local not specified - PPO

## 2022-11-09 ENCOUNTER — Ambulatory Visit: Payer: Federal, State, Local not specified - PPO | Admitting: Nurse Practitioner

## 2022-11-09 ENCOUNTER — Encounter: Payer: Self-pay | Admitting: Nurse Practitioner

## 2022-11-09 VITALS — BP 152/74 | Temp 97.7°F | Resp 20 | Ht 65.0 in | Wt 213.0 lb

## 2022-11-09 DIAGNOSIS — M79642 Pain in left hand: Secondary | ICD-10-CM

## 2022-11-09 DIAGNOSIS — M533 Sacrococcygeal disorders, not elsewhere classified: Secondary | ICD-10-CM

## 2022-11-09 DIAGNOSIS — S3992XA Unspecified injury of lower back, initial encounter: Secondary | ICD-10-CM | POA: Diagnosis not present

## 2022-11-09 DIAGNOSIS — S6992XA Unspecified injury of left wrist, hand and finger(s), initial encounter: Secondary | ICD-10-CM | POA: Diagnosis not present

## 2022-11-09 MED ORDER — CYCLOBENZAPRINE HCL 5 MG PO TABS: 5.0000 mg | ORAL_TABLET | Freq: Three times a day (TID) | ORAL | 1 refills | Status: DC | PRN

## 2022-11-09 MED ORDER — NAPROXEN 500 MG PO TABS: 500.0000 mg | ORAL_TABLET | Freq: Two times a day (BID) | ORAL | 1 refills | Status: AC

## 2022-11-09 NOTE — Progress Notes (Signed)
Subjective:    Patient ID: Jean Hernandez, female    DOB: [birth year], 52 y.o.   MRN: [id number]   Chief Complaint: Hand Pain (Left  [address] this AM down cement stairs/) and pain in buttocks   Hand Pain  Pertinent negatives include no chest pain.   Patient fell this morning down some cement steps. She landed on her tail bone and left hand. Rates pain 8/10. Painful walk and sit. Pain in left hand with gripping. Having muscle spasm since she fell    Review of Systems  Constitutional:  Negative for diaphoresis.  Eyes:  Negative for pain.  Respiratory:  Negative for shortness of breath.   Cardiovascular:  Negative for chest pain, palpitations and leg swelling.  Gastrointestinal:  Negative for abdominal pain.  Endocrine: Negative for polydipsia.  Skin:  Negative for rash.  Neurological:  Negative for dizziness, weakness and headaches.  Hematological:  Does not bruise/bleed easily.  All other systems reviewed and are negative.      Objective:   Physical Exam Vitals and nursing note reviewed.  Constitutional:      General: She is not in acute distress.    Appearance: Normal appearance. She is well-developed.  Neck:     Vascular: No carotid bruit or JVD.  Cardiovascular:     Rate and Rhythm: Normal rate and regular rhythm.     Heart sounds: Normal heart sounds.  Pulmonary:     Effort: Pulmonary effort is normal. No respiratory distress.     Breath sounds: Normal breath sounds. No wheezing or rales.  Chest:     Chest wall: No tenderness.  Abdominal:     General: Bowel sounds are normal. There is no distension or abdominal bruit.     Palpations: Abdomen is soft. There is no hepatomegaly, splenomegaly, mass or pulsatile mass.     Tenderness: There is no abdominal tenderness.  Musculoskeletal:        General: Normal range of motion.     Cervical back: Normal range of motion and neck supple.     Comments: Left hand mild edema Thumb opposition causes pain Pain with gripping of  left hand  Lymphadenopathy:     Cervical: No cervical adenopathy.  Skin:    General: Skin is warm and dry.     Comments: Contusion buttocks area bil  Neurological:     Mental Status: She is alert and oriented to person, place, and time.     Deep Tendon Reflexes: Reflexes are normal and symmetric.  Psychiatric:        Behavior: Behavior normal.        Thought Content: Thought content normal.        Judgment: Judgment normal.     BP (!) 152/74   Temp 97.7 F (36.5 C) (Temporal)   Resp 20   Ht 5\' 5"  (1.651 m)   Wt 213 lb (96.6 kg)   SpO2 90%   BMI 35.45 kg/m    Left hand- no fracture seen-Preliminary reading by Ronnald Collum, FNP  [facility]  Coccyx xray- possible fracture- radiology report pending-Preliminary reading by Ronnald Collum, FNP  [facility]      Assessment & Plan:   Jean Hernandez in today with chief complaint of Hand Pain (Left  [address] this AM down cement stairs/) and pain in buttocks   1. Left hand pain Rest Elevate to prevent swelling Thumb spica splint - DG Hand Complete Left  2. Coccyx pain Sit on donut May take 4-6 weeks  to completely resolve - DG Sacrum/Coccyx; Future  Meds ordered this encounter  Medications   naproxen (NAPROSYN) 500 MG tablet    Sig: Take 1 tablet (500 mg total) by mouth 2 (two) times daily with a meal.    Dispense:  60 tablet    Refill:  1    Order Specific Question:   Supervising Provider    Answer:   Caryl Pina A [[id number]]   cyclobenzaprine (FLEXERIL) 5 MG tablet    Sig: Take 1 tablet (5 mg total) by mouth 3 (three) times daily as needed for muscle spasms.    Dispense:  30 tablet    Refill:  1    Order Specific Question:   Supervising Provider    Answer:   Caryl Pina A [[id number]]     The above assessment and management plan was discussed with the patient. The patient verbalized understanding of and has agreed to the management plan. Patient is aware to call the clinic if symptoms persist or worsen. Patient is aware when  to return to the clinic for a follow-up visit. Patient educated on when it is appropriate to go to the emergency department.   Mary-Margaret Hassell Done, FNP  rest

## 2022-11-09 NOTE — Patient Instructions (Signed)
Tailbone Injury  The tailbone is the small bone at the lower end of the spine. The tailbone is also called the coccyx.A tailbone injury may involve stretched ligaments, bruising, or a broken bone/break (fracture). Tailbone injuries can be painful, and some may take a long time to heal. What are the causes? This condition may be caused by: Falling and landing on the tailbone. Repeated strain or friction from sitting for long periods of time. This may include actions such as rowing or bicycling. Childbirth. In some cases, the cause may not be known. What are the signs or symptoms? Symptoms of this condition include: Pain in the tailbone area or lower back, especially when sitting. Pain or difficulty when standing up from a sitting position. Bruising or swelling in the tailbone area. Painful bowel movements. In females, pain during sex. How is this diagnosed? This condition may be diagnosed based on your symptoms and a physical exam. If your health care provider suspects a fracture, you may have additional tests, such as: X-rays. CT scan. MRI. How is this treated? Most tailbone injuries heal on their own in 4-6 weeks. However, recovery time may be longer if there is a fracture. If treatment is needed, it may include: NSAIDs or other over-the-counter medicines to help relieve your pain. Rubber or inflated ring or cushion to take pressure off the tailbone when sitting. Physical therapy. Injection with local anesthesia and steroid medicine. This is done only if the pain does not improve over time with over-the-counter pain medicines. Follow these instructions at home: Activity Rest as told by your health care provider. Do not sit for a long time without moving. Get up to take short walks every 1-2 hours. This will improve blood flow and breathing. Ask for help if you feel weak or unsteady. Wear appropriate padding and sports gear when bicycling or rowing. This will prevent repeating an  injury that is caused by strain or friction. Do exercises as told by your health care provider. Increase your activity as the pain allows. Return to your normal activities as told by your health care provider. Ask your health care provider what activities are safe for you. Managing pain, stiffness, and swelling To help decrease discomfort when sitting: Sit on your rubber or inflated ring or cushion as told by your health care provider. Lean forward when you sit. If directed, put ice on the injured area. To do this: Put ice in a plastic bag. Place a towel between your skin and the bag. Leave the ice on for 20 minutes, 2-3 times per day for the first 1-2 days. If your skin turns bright red, remove the ice right away to prevent skin damage. The risk of skin damage is higher if you cannot feel pain, heat, or cold. If directed, apply heat to the affected area as often as told by your health care provider. Use the heat source that your health care provider recommends, such as a moist heat pack or a heating pad. Place a towel between your skin and the heat source. Leave the heat on for 20-30 minutes. If your skin turns bright red, remove the heat right away to prevent burns. The risk of burns is higher if you cannot feel pain, heat, or cold. General instructions Take over-the-counter and prescription medicines only as told by your health care provider. Your condition or medicine may cause constipation or painful bowel movements. To prevent or treat constipation, you may need to: Drink enough fluid to keep your urine pale yellow.  Take over-the-counter or prescription medicines. Eat foods that are high in fiber, such as beans, whole grains, and fresh fruits and vegetables. Limit foods that are high in fat and processed sugars, such as fried or sweet foods. Contact a health care provider if: Your pain becomes worse or is not controlled with medicine. Your bowel movements cause a great deal of  discomfort. You are unable to have a bowel movement after 4 days. You have pain during sex. Summary A tailbone injury may involve stretched ligaments, bruising, or a broken bone/break (fracture). Tailbone injuries can be painful. Most heal on their own in 4-6 weeks. Treatment may include taking NSAIDs, doing physical therapy, or using a rubber or inflated ring or cushion when sitting. Follow any recommendations from your health care provider to prevent or treat constipation. This information is not intended to replace advice given to you by your health care provider. Make sure you discuss any questions you have with your health care provider. Document Revised: 01/09/2022 Document Reviewed: 01/09/2022 Elsevier Patient Education  [place].

## 2023-01-03 ENCOUNTER — Other Ambulatory Visit: Payer: Self-pay | Admitting: Family Medicine

## 2023-01-21 DIAGNOSIS — Z79899 Other long term (current) drug therapy: Secondary | ICD-10-CM | POA: Diagnosis not present

## 2023-01-21 DIAGNOSIS — M0579 Rheumatoid arthritis with rheumatoid factor of multiple sites without organ or systems involvement: Secondary | ICD-10-CM | POA: Diagnosis not present

## 2023-01-21 DIAGNOSIS — M818 Other osteoporosis without current pathological fracture: Secondary | ICD-10-CM | POA: Diagnosis not present

## 2023-01-21 DIAGNOSIS — M1991 Primary osteoarthritis, unspecified site: Secondary | ICD-10-CM | POA: Diagnosis not present

## 2023-01-22 LAB — LAB REPORT - SCANNED: EGFR: 88

## 2023-02-21 ENCOUNTER — Ambulatory Visit: Payer: Federal, State, Local not specified - PPO | Admitting: Pulmonary Disease

## 2023-02-21 ENCOUNTER — Ambulatory Visit (INDEPENDENT_AMBULATORY_CARE_PROVIDER_SITE_OTHER): Payer: Federal, State, Local not specified - PPO

## 2023-02-21 ENCOUNTER — Encounter: Payer: Self-pay | Admitting: Pulmonary Disease

## 2023-02-21 VITALS — BP 128/84 | HR 82 | Ht 65.0 in | Wt 225.0 lb

## 2023-02-21 DIAGNOSIS — R053 Chronic cough: Secondary | ICD-10-CM

## 2023-02-21 DIAGNOSIS — J4520 Mild intermittent asthma, uncomplicated: Secondary | ICD-10-CM

## 2023-02-21 DIAGNOSIS — K219 Gastro-esophageal reflux disease without esophagitis: Secondary | ICD-10-CM | POA: Diagnosis not present

## 2023-02-21 DIAGNOSIS — R059 Cough, unspecified: Secondary | ICD-10-CM | POA: Diagnosis not present

## 2023-02-21 MED ORDER — FAMOTIDINE 20 MG PO TABS: 20.0000 mg | ORAL_TABLET | Freq: Every day | ORAL | 5 refills | Status: DC

## 2023-02-21 MED ORDER — ALBUTEROL SULFATE HFA 108 (90 BASE) MCG/ACT IN AERS: 1.0000 | INHALATION_SPRAY | Freq: Four times a day (QID) | RESPIRATORY_TRACT | 6 refills | Status: DC | PRN

## 2023-02-21 MED ORDER — FLUTICASONE-SALMETEROL 115-21 MCG/ACT IN AERO: 2.0000 | INHALATION_SPRAY | Freq: Two times a day (BID) | RESPIRATORY_TRACT | 12 refills | Status: DC

## 2023-02-21 MED ORDER — FLUTICASONE PROPIONATE 50 MCG/ACT NA SUSP: 1.0000 | Freq: Every day | NASAL | 2 refills | Status: AC

## 2023-02-21 MED ORDER — MONTELUKAST SODIUM 10 MG PO TABS: 10.0000 mg | ORAL_TABLET | Freq: Every day | ORAL | 11 refills | Status: AC

## 2023-02-21 NOTE — Patient Instructions (Addendum)
Start advair inhaler 2 puffs twice daily - rinse mouth out after each use  Use albuterol inhaler 1-2 puffs every 4-6 hours as needed  Start montelukast 10mg  daily at bedtime  Start flonase nasal spray, 1 spray per nostril daily  Continue zyrtec for allergies  Start famotidine 20mg  daily at bed time for reflux  We will check a chest x-ray today  Follow up in 2 months with pulmonary function tests

## 2023-02-21 NOTE — Progress Notes (Signed)
Synopsis: Referred in May 2024 for chronic cough by Mechele Claude, MD  Subjective:   PATIENT ID: Jean Hernandez GENDER: female DOB: 05-04-1971, MRN: [id number]  HPI  Chief Complaint  Patient presents with   Consult    Former MW pt for a chronic cough. States she was coughing to the point she was vomitting. Has not done this in 2 weeks.    Jean Hernandez is a 52 year old woman, never smoker with history of GERD, rheumatoid arthritis, mild OSA and asthma who is referred to pulmonary clinic for chronic cough.   She reports chronic cough with episodes of post-tussive emesis over the past 7-10 years. She has been seen by GI at [facility] who planned and EGD and Colonoscopy for further evaluation but she has not completed this work up. She does have reflux symptoms and is not currently on acid suppression treatment. She reports wheezing and shortness of breath with the cough. She does have night time awakenings due to the cough. She was sick with Covid in December 2023 which made her cough worse but it did respond to prednisone and tessalon perles. She does have seasonal allergies and history of allergy induced asthma. She is taking zyrtec daily for allergies. Allergy testing in 2018 showed elevated IgE level of 197.   In 2019 she was an avid walker, lost 40lbs and got down to 180-185lbs. In 2020 she had left knee replacement and gained all of the weight back.   She went to accupuncture for her left arm and the acupuncturist said she has severe GERD and did a manipulation of her neck and she has not had episodes of vomiting for 2 weeks.   She is a never smoker. Her husband smokes, but does not smoke in the house or in the cars. She has 2 dogs and a cat.   Past Medical History:  Diagnosis Date   Anxiety    Asthma    Allergy induced   GERD (gastroesophageal reflux disease)    Hypothyroidism    Rheumatoid arthritis (HCC)    Sleep apnea    mild no mask     Family History  Problem Relation Age of  Onset   Hypertension Mother    Allergies Mother    Diabetes Father    Dementia Father    Heart disease Father    Early death Father    Allergies Father    Allergies Sister      Social History   Socioeconomic History   Marital status: Married    Spouse name: Not on file   Number of children: Not on file   Years of education: Not on file   Highest education level: Not on file  Occupational History   Not on file  Tobacco Use   Smoking status: Never   Smokeless tobacco: Never  Vaping Use   Vaping Use: Never used  Substance and Sexual Activity   Alcohol use: No    Alcohol/week: 0.0 standard drinks of alcohol   Drug use: No   Sexual activity: Yes  Other Topics Concern   Not on file  Social History Narrative   Not on file   Social Determinants of Health   Financial Resource Strain: Not on file  Food Insecurity: Not on file  Transportation Needs: Not on file  Physical Activity: Not on file  Stress: Not on file  Social Connections: Not on file  Intimate Partner Violence: Not on file     Allergies  Allergen  Reactions   Amoxicillin Itching and Rash    REACTION: hives   Other Itching    Dilaudid   Oxycodone-Acetaminophen Itching and Rash   Percocet [Oxycodone-Acetaminophen] Itching and Rash   Ultracet [Tramadol-Acetaminophen] Itching, Rash and Hives    REACTION: itching   Vicodin [Hydrocodone-Acetaminophen] Itching and Hives   Propoxyphene N-Acetaminophen     REACTION: itching     Outpatient Medications Prior to Visit  Medication Sig Dispense Refill   Calcium Citrate (CITRACAL PO) Take 1 tablet by mouth 2 (two) times daily.     Certolizumab Pegol (CIMZIA [place]) Inject into the skin.     Certolizumab Pegol 2 X 200 MG KIT Inject into the skin every 30 (thirty) days.      Cholecalciferol (VITAMIN D-1000 MAX ST) 25 MCG (1000 UT) tablet Take 1,000 Units by mouth daily.     dextromethorphan-guaiFENesin (MUCINEX DM) 30-600 MG 12hr tablet Take 1 tablet by mouth 2 (two)  times daily.     leflunomide (ARAVA) 20 MG tablet Take 20 mg by mouth daily.     mirtazapine (REMERON) 15 MG tablet Take 1 tablet (15 mg total) by mouth at bedtime. (NEEDS TO BE SEEN BEFORE NEXT REFILL) 30 tablet 0   naproxen (NAPROSYN) 500 MG tablet Take 1 tablet (500 mg total) by mouth 2 (two) times daily with a meal. 60 tablet 1   albuterol (VENTOLIN HFA) 108 (90 Base) MCG/ACT inhaler Inhale 2 puffs into the lungs every 6 (six) hours as needed for wheezing or shortness of breath. 8 g 0   benzonatate (TESSALON) 200 MG capsule Take 1 capsule (200 mg total) by mouth 2 (two) times daily as needed for cough. 20 capsule 0   cyclobenzaprine (FLEXERIL) 5 MG tablet Take 1 tablet (5 mg total) by mouth 3 (three) times daily as needed for muscle spasms. 30 tablet 1   ipratropium (ATROVENT) 0.06 % nasal spray Place 1 spray into both nostrils 3 (three) times daily.     triamcinolone (NASACORT ALLERGY 24HR) 55 MCG/ACT AERO nasal inhaler Place 2 sprays into the nose daily.     No facility-administered medications prior to visit.   Review of Systems  Constitutional:  Negative for chills, fever, malaise/fatigue and weight loss.  HENT:  Negative for congestion, sinus pain and sore throat.   Eyes: Negative.   Respiratory:  Positive for cough and shortness of breath. Negative for hemoptysis, sputum production and wheezing.   Cardiovascular:  Negative for chest pain, palpitations, orthopnea, claudication and leg swelling.  Gastrointestinal:  Positive for heartburn. Negative for abdominal pain, nausea and vomiting.  Genitourinary: Negative.   Musculoskeletal:  Positive for joint pain. Negative for myalgias.  Skin:  Negative for rash.  Neurological:  Negative for weakness.  Endo/Heme/Allergies: Negative.   Psychiatric/Behavioral:  The patient is nervous/anxious.    Objective:   Vitals:   02/21/23 0841  BP: 128/84  Pulse: 82  SpO2: 97%  Weight: 225 lb (102.1 kg)  Height: 5\' 5"  (1.651 m)   Physical  Exam Constitutional:      General: She is not in acute distress.    Appearance: She is not ill-appearing.  HENT:     Head: Normocephalic and atraumatic.  Eyes:     General: No scleral icterus.    Conjunctiva/sclera: Conjunctivae normal.     Pupils: Pupils are equal, round, and reactive to light.  Cardiovascular:     Rate and Rhythm: Normal rate and regular rhythm.     Pulses: Normal pulses.  Heart sounds: Normal heart sounds. No murmur heard. Pulmonary:     Effort: Pulmonary effort is normal.     Breath sounds: Normal breath sounds. No wheezing, rhonchi or rales.  Abdominal:     General: Bowel sounds are normal.     Palpations: Abdomen is soft.  Musculoskeletal:     Right lower leg: No edema.     Left lower leg: No edema.  Lymphadenopathy:     Cervical: No cervical adenopathy.  Skin:    General: Skin is warm and dry.  Neurological:     General: No focal deficit present.     Mental Status: She is alert.  Psychiatric:        Mood and Affect: Mood normal.        Behavior: Behavior normal.        Thought Content: Thought content normal.        Judgment: Judgment normal.    CBC    Component Value Date/Time   WBC 7.3 12/28/2021 1015   WBC 13.3 (H) 09/29/2019 0236   RBC 4.83 12/28/2021 1015   RBC 4.52 09/29/2019 0236   HGB 15.2 12/28/2021 1015   HCT 44.4 12/28/2021 1015   PLT 246 12/28/2021 1015   MCV 92 12/28/2021 1015   MCH 31.5 12/28/2021 1015   MCH 31.2 09/29/2019 0236   MCHC 34.2 12/28/2021 1015   MCHC 33.2 09/29/2019 0236   RDW 12.2 12/28/2021 1015   LYMPHSABS 2.6 12/28/2021 1015   MONOABS 1.1 (H) 07/18/2017 1502   EOSABS 0.1 12/28/2021 1015   BASOSABS 0.0 12/28/2021 1015      Latest Ref Rng & Units 12/28/2021   10:15 AM 04/28/2021    8:05 AM 09/29/2019    2:36 AM  BMP  Glucose 70 - 99 mg/dL 93  161  096   BUN 6 - 24 mg/dL 14  16  10    Creatinine 0.57 - 1.00 mg/dL 0.45  4.09  8.11   BUN/Creat Ratio 9 - 23 19  21     Sodium 134 - 144 mmol/L 138  137   137   Potassium 3.5 - 5.2 mmol/L 4.1  4.3  4.1   Chloride 96 - 106 mmol/L 103  100  104   CO2 20 - 29 mmol/L 21  21  23    Calcium 8.7 - 10.2 mg/dL 9.7  9.6  8.9    Chest imaging:  PFT:    Latest Ref Rng & Units 09/03/2017    8:39 AM  PFT Results  FVC-Pre L 3.58   FVC-Predicted Pre % 93   FVC-Post L 3.19   FVC-Predicted Post % 83   Pre FEV1/FVC % % 73   Post FEV1/FCV % % 78   FEV1-Pre L 2.62   FEV1-Predicted Pre % 86   FEV1-Post L 2.50     Labs:  Path:  Echo:  Heart Catheterization:    Assessment & Plan:   Chronic cough - Plan: DG Chest 2 View, fluticasone (FLONASE) 50 MCG/ACT nasal spray  Gastroesophageal reflux disease without esophagitis - Plan: famotidine (PEPCID) 20 MG tablet  Mild intermittent reactive airway disease without complication - Plan: montelukast (SINGULAIR) 10 MG tablet, fluticasone-salmeterol (ADVAIR HFA) 115-21 MCG/ACT inhaler, albuterol (VENTOLIN HFA) 108 (90 Base) MCG/ACT inhaler, Pulmonary Function Test  Discussion: Jean Hernandez is a 52 year old woman, never smoker with history of GERD, rheumatoid arthritis, mild OSA and asthma who is referred to pulmonary clinic for chronic cough.   Her cough is likely due  to upper airway cough syndrome due to post nasal drainage, allergies, reactive airways disease and GERD.   She is to start advair HFA inhaler 115-17mcg 2 puffs twice daily and as needed albuterol. Start montelukast 10mg  daily at bedtime. She is to start fluticasone nasal spray, 1 spray per nostril daily. She is to start famotidine 20mg  daily at bedtime.   Check Chest x-ray today.  Follow up in 2 months with pulmonary function tests.  Melody Comas, MD [facility] Office: [phone]   Current Outpatient Medications:    albuterol (VENTOLIN HFA) 108 (90 Base) MCG/ACT inhaler, Inhale 1-2 puffs into the lungs every 6 (six) hours as needed for wheezing or shortness of breath., Disp: 8 g, Rfl: 6   Calcium Citrate  (CITRACAL PO), Take 1 tablet by mouth 2 (two) times daily., Disp: , Rfl:    Certolizumab Pegol (CIMZIA [place]), Inject into the skin., Disp: , Rfl:    Certolizumab Pegol 2 X 200 MG KIT, Inject into the skin every 30 (thirty) days. , Disp: , Rfl:    Cholecalciferol (VITAMIN D-1000 MAX ST) 25 MCG (1000 UT) tablet, Take 1,000 Units by mouth daily., Disp: , Rfl:    dextromethorphan-guaiFENesin (MUCINEX DM) 30-600 MG 12hr tablet, Take 1 tablet by mouth 2 (two) times daily., Disp: , Rfl:    famotidine (PEPCID) 20 MG tablet, Take 1 tablet (20 mg total) by mouth at bedtime., Disp: 30 tablet, Rfl: 5   fluticasone (FLONASE) 50 MCG/ACT nasal spray, Place 1 spray into both nostrils daily., Disp: 16 g, Rfl: 2   fluticasone-salmeterol (ADVAIR HFA) 115-21 MCG/ACT inhaler, Inhale 2 puffs into the lungs 2 (two) times daily., Disp: 1 each, Rfl: 12   leflunomide (ARAVA) 20 MG tablet, Take 20 mg by mouth daily., Disp: , Rfl:    mirtazapine (REMERON) 15 MG tablet, Take 1 tablet (15 mg total) by mouth at bedtime. (NEEDS TO BE SEEN BEFORE NEXT REFILL), Disp: 30 tablet, Rfl: 0   montelukast (SINGULAIR) 10 MG tablet, Take 1 tablet (10 mg total) by mouth at bedtime., Disp: 30 tablet, Rfl: 11   naproxen (NAPROSYN) 500 MG tablet, Take 1 tablet (500 mg total) by mouth 2 (two) times daily with a meal., Disp: 60 tablet, Rfl: 1

## 2023-03-06 ENCOUNTER — Telehealth (INDEPENDENT_AMBULATORY_CARE_PROVIDER_SITE_OTHER): Payer: Federal, State, Local not specified - PPO | Admitting: Family Medicine

## 2023-03-06 ENCOUNTER — Encounter: Payer: Self-pay | Admitting: Family Medicine

## 2023-03-06 DIAGNOSIS — R351 Nocturia: Secondary | ICD-10-CM | POA: Diagnosis not present

## 2023-03-06 DIAGNOSIS — F5101 Primary insomnia: Secondary | ICD-10-CM | POA: Diagnosis not present

## 2023-03-06 DIAGNOSIS — G4733 Obstructive sleep apnea (adult) (pediatric): Secondary | ICD-10-CM

## 2023-03-06 NOTE — Progress Notes (Addendum)
Virtual Visit via Video Note  I connected with Jean Hernandez on 03/06/23 at  5:15 PM EDT by a video enabled telemedicine application and verified that I am speaking with the correct person using two identifiers.  Patient Location: Other:  Work  Dispensing optician: Office/Clinic  I discussed the limitations, risks, security, and privacy concerns of performing an evaluation and management service by video and the availability of in person appointments. I also discussed with the patient that there may be a patient responsible charge related to this service. The patient expressed understanding and agreed to proceed.  Subjective: PCP: Mechele Claude, MD  Chief Complaint  Patient presents with   Insomnia   Insomnia  States that symptoms started in 2007. She has tried multiple OTC medications such as Tylenol PM and Advil PM, melatonin, benadryl. She tried gabapentin during knee replacement and that helped her sleep, but it stopped working after some time. Reports that she wakes up 3-4 times per night to urinate. She is cutting out caffeine and cutting out liquids after 8 pm. States that urinary symptoms started 6 months ago. Reports that she is urinating normally throughout the day. Denies other symptoms of UTI such as burning, itching, discharge. She was evaluated for OSA in 2016. She is not currently using a CPAP as she report claustrophobia. States that she was recently seen by [employer] and is being treated for asthma, which is significantly helping her symptoms.   Has RA and had recent labs, not in chart.  ROS: Per HPI  Current Outpatient Medications:    albuterol (VENTOLIN HFA) 108 (90 Base) MCG/ACT inhaler, Inhale 1-2 puffs into the lungs every 6 (six) hours as needed for wheezing or shortness of breath., Disp: 8 g, Rfl: 6   Calcium Citrate (CITRACAL PO), Take 1 tablet by mouth 2 (two) times daily., Disp: , Rfl:    Certolizumab Pegol (CIMZIA [place]), Inject into the skin., Disp: , Rfl:     Certolizumab Pegol 2 X 200 MG KIT, Inject into the skin every 30 (thirty) days. , Disp: , Rfl:    Cholecalciferol (VITAMIN D-1000 MAX ST) 25 MCG (1000 UT) tablet, Take 1,000 Units by mouth daily., Disp: , Rfl:    dextromethorphan-guaiFENesin (MUCINEX DM) 30-600 MG 12hr tablet, Take 1 tablet by mouth 2 (two) times daily., Disp: , Rfl:    famotidine (PEPCID) 20 MG tablet, Take 1 tablet (20 mg total) by mouth at bedtime., Disp: 30 tablet, Rfl: 5   fluticasone (FLONASE) 50 MCG/ACT nasal spray, Place 1 spray into both nostrils daily., Disp: 16 g, Rfl: 2   fluticasone-salmeterol (ADVAIR HFA) 115-21 MCG/ACT inhaler, Inhale 2 puffs into the lungs 2 (two) times daily., Disp: 1 each, Rfl: 12   leflunomide (ARAVA) 20 MG tablet, Take 20 mg by mouth daily., Disp: , Rfl:    mirtazapine (REMERON) 15 MG tablet, Take 1 tablet (15 mg total) by mouth at bedtime. (NEEDS TO BE SEEN BEFORE NEXT REFILL), Disp: 30 tablet, Rfl: 0   montelukast (SINGULAIR) 10 MG tablet, Take 1 tablet (10 mg total) by mouth at bedtime., Disp: 30 tablet, Rfl: 11   naproxen (NAPROSYN) 500 MG tablet, Take 1 tablet (500 mg total) by mouth 2 (two) times daily with a meal., Disp: 60 tablet, Rfl: 1  Observations/Objective: There were no vitals filed for this visit. Physical Exam Constitutional:      General: She is awake. She is not in acute distress.    Appearance: Normal appearance. She is well-developed and  well-groomed. She is not ill-appearing, toxic-appearing or diaphoretic.     Interventions: She is not intubated. Pulmonary:     Effort: Pulmonary effort is normal. No tachypnea, bradypnea, accessory muscle usage, prolonged expiration, respiratory distress or retractions. She is not intubated.  Neurological:     General: No focal deficit present.     Mental Status: She is alert, oriented to person, place, and time and easily aroused. Mental status is at baseline.     GCS: GCS eye subscore is 4. GCS verbal subscore is 5. GCS motor  subscore is 6.  Psychiatric:        Attention and Perception: Attention and perception normal.        Mood and Affect: Mood and affect normal.        Speech: Speech normal.        Behavior: Behavior normal. Behavior is cooperative.        Thought Content: Thought content normal.        Cognition and Memory: Cognition and memory normal.    Assessment and Plan: 1. Primary insomnia Continues to have insomnia. Discussed sleep hygiene and potential pharmacologic regimens. Patient to follow up with sleep study and fax labs to office. Provided fax number.  - Ambulatory referral to Sleep Studies  2. OSA (obstructive sleep apnea) Recommended patient follow up with repeat sleep study. She is currently being seen by [facility] for asthma.  - Ambulatory referral to Sleep Studies  3. Nocturia Would like for patient to have evaluation for Diabetes due to nocturia. Patient had recent labs for RA and states that she would like for me to wait to place order for A1C until she can send labs over. Provided patient with fax number to [facility].   The above assessment and management plan was discussed with the patient. The patient verbalized understanding of and has agreed to the management plan using shared-decision making. Patient is aware to call the clinic if they develop any new symptoms or if symptoms fail to improve or worsen. Patient is aware when to return to the clinic for a follow-up visit. Patient educated on when it is appropriate to go to the emergency department.   Follow Up Instructions: Return if symptoms worsen or fail to improve.   Total time spent on video 12:08 minutes   I discussed the assessment and treatment plan with the patient. The patient was provided an opportunity to ask questions, and all were answered. The patient agreed with the plan and demonstrated an understanding of the instructions.   The patient was advised to call back or seek an in-person evaluation if the  symptoms worsen or if the condition fails to improve as anticipated.  The above assessment and management plan was discussed with the patient. The patient verbalized understanding of and has agreed to the management plan.   Neale Burly, DNP-FNP [facility] [address] [place], [place] [address] [phone]

## 2023-03-08 ENCOUNTER — Encounter: Payer: Self-pay | Admitting: Family Medicine

## 2023-03-08 ENCOUNTER — Telehealth: Payer: Self-pay | Admitting: Family Medicine

## 2023-03-08 NOTE — Telephone Encounter (Signed)
Patient said she had lab work sent from a different office through fax so that she could get medication. Had appt with Jerrel Ivory on 5/22 and said she was the one who needed the information to be able to prescribe.   I have located the paperwork that was sent to [place] and it has been given to the provider to review.

## 2023-04-23 DIAGNOSIS — M0579 Rheumatoid arthritis with rheumatoid factor of multiple sites without organ or systems involvement: Secondary | ICD-10-CM | POA: Diagnosis not present

## 2023-05-10 ENCOUNTER — Encounter: Payer: Self-pay | Admitting: Pulmonary Disease

## 2023-05-10 ENCOUNTER — Ambulatory Visit: Payer: Federal, State, Local not specified - PPO | Admitting: Pulmonary Disease

## 2023-05-10 ENCOUNTER — Ambulatory Visit (INDEPENDENT_AMBULATORY_CARE_PROVIDER_SITE_OTHER): Payer: Federal, State, Local not specified - PPO | Admitting: Pulmonary Disease

## 2023-05-10 VITALS — BP 124/82 | HR 95 | Ht 65.0 in | Wt 230.4 lb

## 2023-05-10 DIAGNOSIS — K219 Gastro-esophageal reflux disease without esophagitis: Secondary | ICD-10-CM | POA: Diagnosis not present

## 2023-05-10 DIAGNOSIS — R053 Chronic cough: Secondary | ICD-10-CM

## 2023-05-10 DIAGNOSIS — J4520 Mild intermittent asthma, uncomplicated: Secondary | ICD-10-CM | POA: Diagnosis not present

## 2023-05-10 LAB — PULMONARY FUNCTION TEST
DL/VA % pred: 94 %
DL/VA: 4.01 ml/min/mmHg/L
DLCO cor % pred: 84 %
DLCO cor: 18.08 ml/min/mmHg
DLCO unc % pred: 84 %
DLCO unc: 18.08 ml/min/mmHg
FEF 25-75 Post: 2.3 L/sec
FEF 25-75 Pre: 2.2 L/sec
FEF2575-%Change-Post: 4 %
FEF2575-%Pred-Post: 85 %
FEF2575-%Pred-Pre: 81 %
FEV1-%Change-Post: 1 %
FEV1-%Pred-Post: 84 %
FEV1-%Pred-Pre: 83 %
FEV1-Post: 2.39 L
FEV1-Pre: 2.35 L
FEV1FVC-%Change-Post: 0 %
FEV1FVC-%Pred-Pre: 100 %
FEV6-%Change-Post: 0 %
FEV6-%Pred-Post: 84 %
FEV6-%Pred-Pre: 84 %
FEV6-Post: 2.96 L
FEV6-Pre: 2.95 L
FEV6FVC-%Pred-Post: 102 %
FEV6FVC-%Pred-Pre: 102 %
FVC-%Change-Post: 0 %
FVC-%Pred-Post: 82 %
FVC-%Pred-Pre: 81 %
FVC-Post: 2.96 L
FVC-Pre: 2.95 L
Post FEV1/FVC ratio: 81 %
Post FEV6/FVC ratio: 100 %
Pre FEV1/FVC ratio: 80 %
Pre FEV6/FVC Ratio: 100 %
RV % pred: 114 %
RV: 2.18 L
TLC % pred: 102 %
TLC: 5.32 L

## 2023-05-10 MED ORDER — PANTOPRAZOLE SODIUM 40 MG PO TBEC: 40.0000 mg | DELAYED_RELEASE_TABLET | Freq: Every day | ORAL | 5 refills | Status: DC

## 2023-05-10 NOTE — Patient Instructions (Addendum)
Your breathing tests are within normal limits  Continue advair inhaler 2 puffs twice daily - rinse mouth out after each use  Continue montelukast 10mg  daily  Continue fluticasone nasal spray daily  Continue zantac at bedtime  Start pantoprazole 40mg  daily, take 30 minutes before breakfast  Recommend using a wedge pillow or elevated the head of the bed with blocks to reduce night time reflux.   We will refer you to our GI team for further evaluation of reflux  Follow up in 6 months

## 2023-05-10 NOTE — Progress Notes (Signed)
Full PFT performed today. °

## 2023-05-10 NOTE — Progress Notes (Unsigned)
Synopsis: Referred in May 2024 for chronic cough by Mechele Claude, MD  Subjective:   PATIENT ID: Jean Hernandez GENDER: female DOB: Jan 28, 1971, MRN: [id number]  HPI  Chief Complaint  Patient presents with   Follow-up    F/U for cough, completed PFT today. States her cough has improved. Has a constant burning pain under left breast.    Jean Hernandez is a 52 year old woman, never smoker with history of GERD, rheumatoid arthritis, mild OSA and asthma who returns to pulmonary clinic for chronic cough.   Initial OV 02/21/23 She reports chronic cough with episodes of post-tussive emesis over the past 7-10 years. She has been seen by GI at [facility] who planned and EGD and Colonoscopy for further evaluation but she has not completed this work up. She does have reflux symptoms and is not currently on acid suppression treatment. She reports wheezing and shortness of breath with the cough. She does have night time awakenings due to the cough. She was sick with Covid in December 2023 which made her cough worse but it did respond to prednisone and tessalon perles. She does have seasonal allergies and history of allergy induced asthma. She is taking zyrtec daily for allergies. Allergy testing in 2018 showed elevated IgE level of 197.   In 2019 she was an avid walker, lost 40lbs and got down to 180-185lbs. In 2020 she had left knee replacement and gained all of the weight back.   She went to accupuncture for her left arm and the acupuncturist said she has severe GERD and did a manipulation of her neck and she has not had episodes of vomiting for 2 weeks.   She is a never smoker. Her husband smokes, but does not smoke in the house or in the cars. She has 2 dogs and a cat.   Today 05/10/23 She was started on advair 115-59mcg 2 puffs twice daily, montelukast daily, fluticasone nasal spray daily and famotidine at bedtime at last visit for her cough. Chest radiograph was unremarkable.   Her cough has improved but  over recent weeks the cough is picking up. Her co-workers have noted her cough is much improved. She complains of chest burning in middle and under left breast. She has discomfort in her chest when eating.   PFTs are within normal limits today.   Past Medical History:  Diagnosis Date   Anxiety    Asthma    Allergy induced   GERD (gastroesophageal reflux disease)    Hypothyroidism    Rheumatoid arthritis (HCC)    Sleep apnea    mild no mask     Family History  Problem Relation Age of Onset   Hypertension Mother    Allergies Mother    Diabetes Father    Dementia Father    Heart disease Father    Early death Father    Allergies Father    Allergies Sister      Social History   Socioeconomic History   Marital status: Married    Spouse name: Not on file   Number of children: Not on file   Years of education: Not on file   Highest education level: Not on file  Occupational History   Not on file  Tobacco Use   Smoking status: Never   Smokeless tobacco: Never  Vaping Use   Vaping status: Never Used  Substance and Sexual Activity   Alcohol use: No    Alcohol/week: 0.0 standard drinks of alcohol   Drug  use: No   Sexual activity: Yes  Other Topics Concern   Not on file  Social History Narrative   Not on file   Social Determinants of Health   Financial Resource Strain: Not on file  Food Insecurity: Not on file  Transportation Needs: Not on file  Physical Activity: Not on file  Stress: Not on file  Social Connections: Unknown (02/25/2022)   [facility] from [facility]   Social Network    Social Network: Not on file  Intimate Partner Violence: Unknown (01/17/2022)   Received from [employer], [facility]   HITS    Physically Hurt: Not on file    Insult or Talk Down To: Not on file    Threaten Physical Harm: Not on file    Scream or Curse: Not on file     Allergies  Allergen Reactions   Amoxicillin Itching and Rash    REACTION: hives    Other Itching    Dilaudid   Oxycodone-Acetaminophen Itching and Rash   Percocet [Oxycodone-Acetaminophen] Itching and Rash   Ultracet [Tramadol-Acetaminophen] Itching, Rash and Hives    REACTION: itching   Vicodin [Hydrocodone-Acetaminophen] Itching and Hives   Darvon [Propoxyphene]    Propoxyphene N-Acetaminophen     REACTION: itching     Outpatient Medications Prior to Visit  Medication Sig Dispense Refill   albuterol (VENTOLIN HFA) 108 (90 Base) MCG/ACT inhaler Inhale 1-2 puffs into the lungs every 6 (six) hours as needed for wheezing or shortness of breath. 8 g 6   Calcium Citrate (CITRACAL PO) Take 1 tablet by mouth 2 (two) times daily.     Certolizumab Pegol (CIMZIA [place]) Inject into the skin.     Cholecalciferol (VITAMIN D-1000 MAX ST) 25 MCG (1000 UT) tablet Take 1,000 Units by mouth daily.     famotidine (PEPCID) 20 MG tablet Take 1 tablet (20 mg total) by mouth at bedtime. 30 tablet 5   fluticasone (FLONASE) 50 MCG/ACT nasal spray Place 1 spray into both nostrils daily. 16 g 2   fluticasone-salmeterol (ADVAIR HFA) 115-21 MCG/ACT inhaler Inhale 2 puffs into the lungs 2 (two) times daily. 1 each 12   leflunomide (ARAVA) 20 MG tablet Take 20 mg by mouth daily.     montelukast (SINGULAIR) 10 MG tablet Take 1 tablet (10 mg total) by mouth at bedtime. 30 tablet 11   naproxen (NAPROSYN) 500 MG tablet Take 1 tablet (500 mg total) by mouth 2 (two) times daily with a meal. (Patient taking differently: Take 500 mg by mouth as needed.) 60 tablet 1   Certolizumab Pegol 2 X 200 MG KIT Inject into the skin every 30 (thirty) days.      dextromethorphan-guaiFENesin (MUCINEX DM) 30-600 MG 12hr tablet Take 1 tablet by mouth 2 (two) times daily.     mirtazapine (REMERON) 15 MG tablet Take 1 tablet (15 mg total) by mouth at bedtime. (NEEDS TO BE SEEN BEFORE NEXT REFILL) 30 tablet 0   No facility-administered medications prior to visit.   Review of Systems  Constitutional:  Negative for chills,  fever, malaise/fatigue and weight loss.  HENT:  Negative for congestion, sinus pain and sore throat.   Eyes: Negative.   Respiratory:  Positive for cough. Negative for hemoptysis, sputum production, shortness of breath and wheezing.   Cardiovascular:  Negative for chest pain, palpitations, orthopnea, claudication and leg swelling.  Gastrointestinal:  Positive for heartburn. Negative for abdominal pain, nausea and vomiting.  Genitourinary: Negative.   Musculoskeletal:  Positive for  joint pain. Negative for myalgias.  Skin:  Negative for rash.  Neurological:  Negative for weakness.  Endo/Heme/Allergies: Negative.   Psychiatric/Behavioral:  The patient is nervous/anxious.    Objective:   Vitals:   05/10/23 1307  BP: 124/82  Pulse: 95  SpO2: 97%  Weight: 230 lb 6.4 oz (104.5 kg)  Height: 5\' 5"  (1.651 m)    Physical Exam Constitutional:      General: She is not in acute distress.    Appearance: She is obese. She is not ill-appearing.  HENT:     Head: Normocephalic and atraumatic.  Eyes:     General: No scleral icterus. Cardiovascular:     Rate and Rhythm: Normal rate and regular rhythm.     Pulses: Normal pulses.     Heart sounds: Normal heart sounds. No murmur heard. Pulmonary:     Effort: Pulmonary effort is normal.     Breath sounds: Normal breath sounds. No wheezing, rhonchi or rales.  Musculoskeletal:     Right lower leg: No edema.     Left lower leg: No edema.  Skin:    General: Skin is warm and dry.  Neurological:     General: No focal deficit present.     Mental Status: She is alert.    CBC    Component Value Date/Time   WBC 7.3 12/28/2021 1015   WBC 13.3 (H) 09/29/2019 0236   RBC 4.83 12/28/2021 1015   RBC 4.52 09/29/2019 0236   HGB 15.2 12/28/2021 1015   HCT 44.4 12/28/2021 1015   PLT 246 12/28/2021 1015   MCV 92 12/28/2021 1015   MCH 31.5 12/28/2021 1015   MCH 31.2 09/29/2019 0236   MCHC 34.2 12/28/2021 1015   MCHC 33.2 09/29/2019 0236   RDW 12.2  12/28/2021 1015   LYMPHSABS 2.6 12/28/2021 1015   MONOABS 1.1 (H) 07/18/2017 1502   EOSABS 0.1 12/28/2021 1015   BASOSABS 0.0 12/28/2021 1015      Latest Ref Rng & Units 12/28/2021   10:15 AM 04/28/2021    8:05 AM 09/29/2019    2:36 AM  BMP  Glucose 70 - 99 mg/dL 93  782  956   BUN 6 - 24 mg/dL 14  16  10    Creatinine 0.57 - 1.00 mg/dL 2.13  0.86  5.78   BUN/Creat Ratio 9 - 23 19  21     Sodium 134 - 144 mmol/L 138  137  137   Potassium 3.5 - 5.2 mmol/L 4.1  4.3  4.1   Chloride 96 - 106 mmol/L 103  100  104   CO2 20 - 29 mmol/L 21  21  23    Calcium 8.7 - 10.2 mg/dL 9.7  9.6  8.9    Chest imaging: CXR 02/25/23 The cardiomediastinal silhouette is unchanged in contour. No pleural effusion. No pneumothorax. No acute pleuroparenchymal abnormality. Visualized abdomen is unremarkable. Mild degenerative changes of the thoracic spine.  PFT:    Latest Ref Rng & Units 05/10/2023   10:49 AM 09/03/2017    8:39 AM  PFT Results  FVC-Pre L 2.95  P 3.58   FVC-Predicted Pre % 81  P 93   FVC-Post L 2.96  P 3.19   FVC-Predicted Post % 82  P 83   Pre FEV1/FVC % % 80  P 73   Post FEV1/FCV % % 81  P 78   FEV1-Pre L 2.35  P 2.62   FEV1-Predicted Pre % 83  P 86   FEV1-Post L  2.39  P 2.50   DLCO uncorrected ml/min/mmHg 18.08  P   DLCO UNC% % 84  P   DLCO corrected ml/min/mmHg 18.08  P   DLCO COR %Predicted % 84  P   DLVA Predicted % 94  P   TLC L 5.32  P   TLC % Predicted % 102  P   RV % Predicted % 114  P     P Preliminary result    Labs:  Path:  Echo:  Heart Catheterization:    Assessment & Plan:   Mild intermittent reactive airway disease without complication  Chronic cough  Gastroesophageal reflux disease without esophagitis - Plan: Ambulatory referral to Gastroenterology, pantoprazole (PROTONIX) 40 MG tablet  Discussion: Jean Hernandez is a 52 year old woman, never smoker with history of GERD, rheumatoid arthritis, mild OSA and asthma who returns to pulmonary clinic for  chronic cough.   Her cough is likely due to upper airway cough syndrome due to post nasal drainage, allergies, reactive airways disease and GERD.   PFTs are within normal limits today.   She is to continue advair HFA inhaler 115-66mcg 2 puffs twice daily and as needed albuterol. Continue montelukast 10mg  daily at bedtime. Continue fluticasone nasal spray, 1 spray per nostril daily. Continue famotidine 20mg  daily at bedtime and start pantoprazole 40mg  daily, 30 minutes prior to breakfast.   Discussed sleeping with the head of the bed elevated and eating 2-3 hours prior to bedtime.   Referral placed to GI for further evaluation of GERD.  Follow up in 6 months.   Melody Comas, MD [facility] Office: [phone]   Current Outpatient Medications:    albuterol (VENTOLIN HFA) 108 (90 Base) MCG/ACT inhaler, Inhale 1-2 puffs into the lungs every 6 (six) hours as needed for wheezing or shortness of breath., Disp: 8 g, Rfl: 6   Calcium Citrate (CITRACAL PO), Take 1 tablet by mouth 2 (two) times daily., Disp: , Rfl:    Certolizumab Pegol (CIMZIA [place]), Inject into the skin., Disp: , Rfl:    Cholecalciferol (VITAMIN D-1000 MAX ST) 25 MCG (1000 UT) tablet, Take 1,000 Units by mouth daily., Disp: , Rfl:    famotidine (PEPCID) 20 MG tablet, Take 1 tablet (20 mg total) by mouth at bedtime., Disp: 30 tablet, Rfl: 5   fluticasone (FLONASE) 50 MCG/ACT nasal spray, Place 1 spray into both nostrils daily., Disp: 16 g, Rfl: 2   fluticasone-salmeterol (ADVAIR HFA) 115-21 MCG/ACT inhaler, Inhale 2 puffs into the lungs 2 (two) times daily., Disp: 1 each, Rfl: 12   leflunomide (ARAVA) 20 MG tablet, Take 20 mg by mouth daily., Disp: , Rfl:    montelukast (SINGULAIR) 10 MG tablet, Take 1 tablet (10 mg total) by mouth at bedtime., Disp: 30 tablet, Rfl: 11   naproxen (NAPROSYN) 500 MG tablet, Take 1 tablet (500 mg total) by mouth 2 (two) times daily with a meal. (Patient taking differently:  Take 500 mg by mouth as needed.), Disp: 60 tablet, Rfl: 1   pantoprazole (PROTONIX) 40 MG tablet, Take 1 tablet (40 mg total) by mouth daily., Disp: 30 tablet, Rfl: 5

## 2023-05-10 NOTE — Patient Instructions (Signed)
Full PFT performed today. °

## 2023-05-12 ENCOUNTER — Encounter: Payer: Self-pay | Admitting: Pulmonary Disease

## 2023-05-15 ENCOUNTER — Telehealth: Payer: Self-pay | Admitting: Gastroenterology

## 2023-05-15 NOTE — Telephone Encounter (Signed)
We can see her - can be seen by any MD or APP - for GERD / chronic cough

## 2023-05-15 NOTE — Telephone Encounter (Signed)
Hi Dr. Adela Lank,    Supervising Provider AM 05/15/2023     We received a referral for patient for GERD. Patient saw GAP in October of 2023. Patient stated she went for a office visit once and did not return after. Stated she was not happy with care provided. Patient felt as though she was not being heard for her symptoms. Patient would like to transfer her care due to being referred to [place]. Patient previous records are in [facility] for you to review and advise on scheduling.     Thank you.

## 2023-05-17 ENCOUNTER — Encounter: Payer: Self-pay | Admitting: Gastroenterology

## 2023-05-27 DIAGNOSIS — M818 Other osteoporosis without current pathological fracture: Secondary | ICD-10-CM | POA: Diagnosis not present

## 2023-08-01 DIAGNOSIS — M1991 Primary osteoarthritis, unspecified site: Secondary | ICD-10-CM | POA: Diagnosis not present

## 2023-08-01 DIAGNOSIS — Z79899 Other long term (current) drug therapy: Secondary | ICD-10-CM | POA: Diagnosis not present

## 2023-08-01 DIAGNOSIS — M818 Other osteoporosis without current pathological fracture: Secondary | ICD-10-CM | POA: Diagnosis not present

## 2023-08-01 DIAGNOSIS — M0579 Rheumatoid arthritis with rheumatoid factor of multiple sites without organ or systems involvement: Secondary | ICD-10-CM | POA: Diagnosis not present

## 2023-08-13 ENCOUNTER — Encounter: Payer: Self-pay | Admitting: Gastroenterology

## 2023-08-13 ENCOUNTER — Ambulatory Visit: Payer: Federal, State, Local not specified - PPO | Admitting: Gastroenterology

## 2023-08-13 DIAGNOSIS — Z1211 Encounter for screening for malignant neoplasm of colon: Secondary | ICD-10-CM | POA: Diagnosis not present

## 2023-08-13 DIAGNOSIS — K219 Gastro-esophageal reflux disease without esophagitis: Secondary | ICD-10-CM

## 2023-08-13 MED ORDER — NA SULFATE-K SULFATE-MG SULF 17.5-3.13-1.6 GM/177ML PO SOLN: 1.0000 | ORAL | 0 refills | Status: DC

## 2023-08-13 NOTE — Progress Notes (Signed)
HPI : Jean Hernandez is a 52 y.o. female with a history of anxiety, asthma, GERD and rheumatoid arthritis who is referred to [place] by Mechele Claude, MD for further evaluation of chronic cough and suspected GERD.   Patient reports she has been having the symptoms for several years now.  They have remained somewhat stable over that time. She describes recurrent vomiting and cough as being her most bothersome symptoms.  Her cough is a daily issue, but the vomiting occurs anywhere from 1-3 times a week.  The vomiting is severe and violent, associated with retching and projectile vomiting.  This typically occurs in the evenings as well as the early morning.  She denies having any vomiting episodes during the day.  The vomiting is usually preceded by coughing and throat irritation. She has not noticed any dietary triggers for these vomiting episodes. She denies any heartburn.  She does experience acid regurgitation, but this is quite rare. The vomiting is not preceded by nausea.  She has no abdominal pain. She denies any dysphagia. Her weight has been stable.   She has been taking Protonix in the morning and Pepcid in the evening, and notes that this has helped some.  She was seen by [facility] (Dr. Malka So) in October of last year for chronic cough, at which time she was scheduled for an EGD and a colonoscopy.  At that visit, her provider felt that her cough is more related to upper airway and less likely from reflux.  The patient did not follow through with the EGD and colonoscopy, as she did not have a good rapport with the provider.  She was seen by [facility] in 2014 because of vomiting and underwent an EGD which was normal.  A subsequent gastric emptying scan was reportedly also normal.  She had a colonoscopy in 2009 to evaluate hematochezia and rectal pain.  No polyps were noted at that time.  She has not had any further colon cancer screening since then. She denies any family history of colon  cancer.    EGD November 2014 (Dr. Evette Cristal, [facility]) Normal esophagus Normal stomach Normal duodenum Note indicates recommendation for gastric emptying study (normal)  Colonoscopy Oct 2009 (Dr. Jarold Motto) Indication: Hematochezia, rectal pain Very tender posterior skin tag, no anal fissure No polyps    Past Medical History:  Diagnosis Date   Anxiety    Asthma    Allergy induced   GERD (gastroesophageal reflux disease)    Hypothyroidism    Rheumatoid arthritis (HCC)    Sleep apnea    mild no mask     Past Surgical History:  Procedure Laterality Date   ABDOMINAL HYSTERECTOMY     many surgeries for this   C sections     x2   colonsocopy  2012   TOTAL KNEE ARTHROPLASTY Left 09/28/2019   Procedure: TOTAL KNEE ARTHROPLASTY;  Surgeon: Ollen Gross, MD;  Location: [facility];  Service: Orthopedics;  Laterality: Left;    Family History  Problem Relation Age of Onset   Hypertension Mother    Allergies Mother    Diabetes Father    Dementia Father    Heart disease Father    Early death Father    Allergies Father    Allergies Sister    Social History   Tobacco Use   Smoking status: Never   Smokeless tobacco: Never  Vaping Use   Vaping status: Never Used  Substance Use Topics   Alcohol use: No  Alcohol/week: 0.0 standard drinks of alcohol   Drug use: No   Current Outpatient Medications  Medication Sig Dispense Refill   albuterol (VENTOLIN HFA) 108 (90 Base) MCG/ACT inhaler Inhale 1-2 puffs into the lungs every 6 (six) hours as needed for wheezing or shortness of breath. 8 g 6   Calcium Citrate (CITRACAL PO) Take 1 tablet by mouth 2 (two) times daily.     Certolizumab Pegol (CIMZIA [place]) Inject into the skin.     Cholecalciferol (VITAMIN D-1000 MAX ST) 25 MCG (1000 UT) tablet Take 1,000 Units by mouth daily.     famotidine (PEPCID) 20 MG tablet Take 1 tablet (20 mg total) by mouth at bedtime. 30 tablet 5   fluticasone (FLONASE) 50 MCG/ACT nasal spray Place 1  spray into both nostrils daily. 16 g 2   fluticasone-salmeterol (ADVAIR HFA) 115-21 MCG/ACT inhaler Inhale 2 puffs into the lungs 2 (two) times daily. 1 each 12   leflunomide (ARAVA) 20 MG tablet Take 20 mg by mouth daily.     montelukast (SINGULAIR) 10 MG tablet Take 1 tablet (10 mg total) by mouth at bedtime. 30 tablet 11   Na Sulfate-K Sulfate-Mg Sulf (SUPREP BOWEL PREP KIT) 17.5-3.13-1.6 GM/177ML SOLN Take 1 kit by mouth as directed. 324 mL 0   naproxen (NAPROSYN) 500 MG tablet Take 1 tablet (500 mg total) by mouth 2 (two) times daily with a meal. (Patient taking differently: Take 500 mg by mouth as needed.) 60 tablet 1   pantoprazole (PROTONIX) 40 MG tablet Take 1 tablet (40 mg total) by mouth daily. 30 tablet 5   No current facility-administered medications for this visit.   Allergies  Allergen Reactions   Amoxicillin Itching and Rash    REACTION: hives   Other Itching    Dilaudid   Oxycodone-Acetaminophen Itching and Rash   Percocet [Oxycodone-Acetaminophen] Itching and Rash   Ultracet [Tramadol-Acetaminophen] Itching, Rash and Hives    REACTION: itching   Vicodin [Hydrocodone-Acetaminophen] Itching and Hives   Darvon [Propoxyphene]    Propoxyphene N-Acetaminophen     REACTION: itching     Review of Systems: All systems reviewed and negative except where noted in HPI.    No results found.  Physical Exam: BP (!) 142/82   Pulse (!) 128   Wt 233 lb (105.7 kg)   BMI 38.77 kg/m  Constitutional: Pleasant,well-developed, Caucasian female in no acute distress. HEENT: Normocephalic and atraumatic. Conjunctivae are normal. No scleral icterus. Neck supple.  Cardiovascular: Normal rate, regular rhythm.  Pulmonary/chest: Effort normal and breath sounds normal. No wheezing, rales or rhonchi. Abdominal: Soft, nondistended, nontender. Bowel sounds active throughout. There are no masses palpable. No hepatomegaly. Extremities: no edema Neurological: Alert and oriented to person  place and time. Skin: Skin is warm and dry. No rashes noted. Psychiatric: Normal mood and affect. Behavior is normal.  CBC    Component Value Date/Time   WBC 7.3 12/28/2021 1015   WBC 13.3 (H) 09/29/2019 0236   RBC 4.83 12/28/2021 1015   RBC 4.52 09/29/2019 0236   HGB 15.2 12/28/2021 1015   HCT 44.4 12/28/2021 1015   PLT 246 12/28/2021 1015   MCV 92 12/28/2021 1015   MCH 31.5 12/28/2021 1015   MCH 31.2 09/29/2019 0236   MCHC 34.2 12/28/2021 1015   MCHC 33.2 09/29/2019 0236   RDW 12.2 12/28/2021 1015   LYMPHSABS 2.6 12/28/2021 1015   MONOABS 1.1 (H) 07/18/2017 1502   EOSABS 0.1 12/28/2021 1015   BASOSABS 0.0 12/28/2021 1015  CMP     Component Value Date/Time   NA 138 12/28/2021 1015   K 4.1 12/28/2021 1015   CL 103 12/28/2021 1015   CO2 21 12/28/2021 1015   GLUCOSE 93 12/28/2021 1015   GLUCOSE 173 (H) 09/29/2019 0236   BUN 14 12/28/2021 1015   CREATININE 0.73 12/28/2021 1015   CALCIUM 9.7 12/28/2021 1015   PROT 6.9 12/28/2021 1015   ALBUMIN 4.4 12/28/2021 1015   AST 28 12/28/2021 1015   ALT 35 (H) 12/28/2021 1015   ALKPHOS 72 12/28/2021 1015   BILITOT 0.5 12/28/2021 1015   GFRNONAA >60 09/29/2019 0236   GFRAA >60 09/29/2019 0236       Latest Ref Rng & Units 12/28/2021   10:15 AM 04/28/2021    8:05 AM 09/29/2019    2:36 AM  CBC EXTENDED  WBC 3.4 - 10.8 x10E3/uL 7.3  7.1  13.3   RBC 3.77 - 5.28 x10E6/uL 4.83  4.83  4.52   Hemoglobin 11.1 - 15.9 g/dL 16.1  09.6  04.5   HCT 34.0 - 46.6 % 44.4  44.8  42.5   Platelets 150 - 450 x10E3/uL 246  238  215   NEUT# 1.4 - 7.0 x10E3/uL 3.9  3.9    Lymph# 0.7 - 3.1 x10E3/uL 2.6  2.4        ASSESSMENT AND PLAN: 52 year old female with longstanding symptoms of recurrent vomiting, often preceded with coughing.  She denies any heartburn, and has rare acid regurgitation.  As her vomiting episodes are primarily associated with supine position, I do suspect they may be related to GERD.  We discussed the pathophysiology of  GERD and the principles of GERD management to include lifestyle modifications  such as dietary discretion (avoidance of alcohol, tobacco, caffeinated and carbonated beverages, spicy/greasy foods, citrus, peppermint/chocolate), weight loss if applicable, head of bed elevation andconsuming last meal of day within 3 hours of bedtime; pharmacologic options to include PPIs, H2RAs and OTC antacids; and finally surgical or endoscopic fundoplication.  We will plan for an upper endoscopy to evaluate for complications of GERD and to assess her anatomy.  If normal, we can consider further testing to include Bravo probe/pH/impedance testing if the patient desires.  Will schedule patient for routine screening colonoscopy at the time of her upper endoscopy.  Cough/recurrent vomiting, suspect GERD - EGD - GERD handout provided  Colon cancer screening - Colonoscopy  The details, risks (including bleeding, perforation, infection, missed lesions, medication reactions and possible hospitalization or surgery if complications occur), benefits, and alternatives to EGD/colonoscopy with possible biopsy and possible polypectomy were discussed with the patient and she consents to proceed.   Danylle Ouk E. Tomasa Rand, MD [facility]  I spent a total of 45 minutes reviewing the patient's medical record, interviewing and examining the patient, discussing her diagnosis and management of her condition going forward, and documenting in the medical record   Mechele Claude, MD

## 2023-08-13 NOTE — Patient Instructions (Addendum)
_______________________________________________________  If your blood pressure at your visit was 140/90 or greater, please contact your primary care physician to follow up on this.  If you are age 54 or younger, your body mass index should be between 19-25. Your Body mass index is 38.77 kg/m. If this is out of the aformentioned range listed, please consider follow up with your Primary Care Provider.  ________________________________________________________  The [facility] providers would like to encourage you to use [facility] to communicate with providers for non-urgent requests or questions.  Due to long hold times on the telephone, sending your provider a message by [facility] may be a faster and more efficient way to get a response.  Please allow 48 business hours for a response.  Please remember that this is for non-urgent requests.  _______________________________________________________  Bonita Quin have been scheduled for an endoscopy and colonoscopy. Please follow the written instructions given to you at your visit today.  Please pick up your prep supplies at the pharmacy within the next 1-3 days.  If you use inhalers (even only as needed), please bring them with you on the day of your procedure.  DO NOT TAKE 7 DAYS PRIOR TO TEST- Trulicity (dulaglutide) Ozempic, Wegovy (semaglutide) Mounjaro (tirzepatide) Bydureon Bcise (exanatide extended release)  DO NOT TAKE 1 DAY PRIOR TO YOUR TEST Rybelsus (semaglutide) Adlyxin (lixisenatide) Victoza (liraglutide) Byetta (exanatide) ___________________________________________________________________________  Due to recent changes in healthcare laws, you may see the results of your imaging and laboratory studies on MyChart before your provider has had a chance to review them.  We understand that in some cases there may be results that are confusing or concerning to you. Not all laboratory results come back in the same time frame and the provider may be  waiting for multiple results in order to interpret others.  Please give [place] 48 hours in order for your provider to thoroughly review all the results before contacting the office for clarification of your results.   Thank you for entrusting me with your care and choosing [facility].  Dr Tomasa Rand

## 2023-08-20 ENCOUNTER — Ambulatory Visit: Payer: Federal, State, Local not specified - PPO | Admitting: Gastroenterology

## 2023-08-20 ENCOUNTER — Encounter: Payer: Self-pay | Admitting: Gastroenterology

## 2023-08-20 VITALS — BP 148/85 | HR 98 | Temp 98.0°F | Resp 24 | Ht 65.0 in | Wt 233.0 lb

## 2023-08-20 DIAGNOSIS — D123 Benign neoplasm of transverse colon: Secondary | ICD-10-CM

## 2023-08-20 DIAGNOSIS — Z1211 Encounter for screening for malignant neoplasm of colon: Secondary | ICD-10-CM

## 2023-08-20 DIAGNOSIS — K219 Gastro-esophageal reflux disease without esophagitis: Secondary | ICD-10-CM

## 2023-08-20 DIAGNOSIS — K209 Esophagitis, unspecified without bleeding: Secondary | ICD-10-CM

## 2023-08-20 DIAGNOSIS — K319 Disease of stomach and duodenum, unspecified: Secondary | ICD-10-CM | POA: Diagnosis not present

## 2023-08-20 DIAGNOSIS — K317 Polyp of stomach and duodenum: Secondary | ICD-10-CM | POA: Diagnosis not present

## 2023-08-20 MED ORDER — ESOMEPRAZOLE MAGNESIUM 40 MG PO CPDR: 40.0000 mg | DELAYED_RELEASE_CAPSULE | Freq: Two times a day (BID) | ORAL | 1 refills | Status: DC

## 2023-08-20 MED ORDER — SODIUM CHLORIDE 0.9 % IV SOLN
4.0000 mg | Freq: Once | INTRAVENOUS | Status: AC
  Administered 2023-08-20: 4 mg via INTRAVENOUS

## 2023-08-20 MED ORDER — SODIUM CHLORIDE 0.9 % IV SOLN
500.0000 mL | Freq: Once | INTRAVENOUS | Status: DC
Start: 2023-08-20 — End: 2023-08-20

## 2023-08-20 NOTE — Progress Notes (Signed)
History and Physical Interval Note:  08/20/2023 3:22 PM  Jean Hernandez  has presented today for endoscopic procedure(s), with the diagnosis of  Encounter Diagnoses  Name Primary?   Gastroesophageal reflux disease, unspecified whether esophagitis present Yes   Colon cancer screening   .  The various methods of evaluation and treatment have been discussed with the patient and/or family. After consideration of risks, benefits and other options for treatment, the patient has consented to  the endoscopic procedure(s).   The patient's history has been reviewed, patient examined, no change in status, stable for endoscopic procedure(s).  I have reviewed the patient's chart and labs.  Questions were answered to the patient's satisfaction.     Naoki Migliaccio E. Tomasa Rand, MD [facility]

## 2023-08-20 NOTE — Op Note (Signed)
[facility] Patient Name: Jean Hernandez Procedure Date: 08/20/2023 3:28 PM MRN: [id number] Endoscopist: Lorin Picket E. Tomasa Rand , MD, [id number] Age: 52 Referring MD:  Date of Birth: 1971/06/04 Gender: Female Account #: [id number] Procedure:                Upper GI endoscopy Indications:              Suspected esophageal reflux, Chronic cough, Nausea                            with vomiting Medicines:                Monitored Anesthesia Care Procedure:                Pre-Anesthesia Assessment:                           - Prior to the procedure, a History and Physical                            was performed, and patient medications and                            allergies were reviewed. The patient's tolerance of                            previous anesthesia was also reviewed. The risks                            and benefits of the procedure and the sedation                            options and risks were discussed with the patient.                            All questions were answered, and informed consent                            was obtained. Prior Anticoagulants: The patient has                            taken no anticoagulant or antiplatelet agents. ASA                            Grade Assessment: II - A patient with mild systemic                            disease. After reviewing the risks and benefits,                            the patient was deemed in satisfactory condition to                            undergo the procedure.  After obtaining informed consent, the endoscope was                            passed under direct vision. Throughout the                            procedure, the patient's blood pressure, pulse, and                            oxygen saturations were monitored continuously. The                            GIF [id number] #[id number] was introduced through the                            mouth, and advanced to the second part  of duodenum.                            The upper GI endoscopy was accomplished without                            difficulty. The patient tolerated the procedure                            well. Scope In: Scope Out: Findings:                 The examined portions of the nasopharynx,                            oropharynx and larynx were normal.                           LA Grade B (one or more mucosal breaks greater than                            5 mm, not extending between the tops of two mucosal                            folds) esophagitis with no bleeding was found at                            the gastroesophageal junction.                           The exam of the esophagus was otherwise normal.                           The gastroesophageal flap valve was visualized                            endoscopically and classified as Hill Grade IV (no                            fold, wide  open lumen, hiatal hernia present).                           A 4 cm hiatal hernia was present.                           A single 4 mm erosion with no bleeding and no                            stigmata of recent bleeding was found in the                            gastric antrum. Biopsies were taken with a cold                            forceps for Helicobacter pylori testing. Estimated                            blood loss was minimal.                           Multiple small sessile polyps were found in the                            gastric body. The polyp was removed with a cold                            biopsy forceps. Resection and retrieval were                            complete. Estimated blood loss was minimal.                           The exam of the stomach was otherwise normal.                           The examined duodenum was normal. Complications:            No immediate complications. Estimated Blood Loss:     Estimated blood loss was minimal. Impression:               - The  examined portions of the nasopharynx,                            oropharynx and larynx were normal.                           - LA Grade B reflux esophagitis with no bleeding.                           - Gastroesophageal flap valve classified as Hill                            Grade IV (no fold, wide open lumen, hiatal hernia  present).                           - 4 cm hiatal hernia.                           - Erosive gastropathy with no bleeding and no                            stigmata of recent bleeding. Biopsied.                           - Multiple gastric polyps. Resected and retrieved.                           - Normal examined duodenum.                           - I suspect the patient's vomiting and possibly her                            cough are secondary to GERD. Recommendation:           - Patient has a contact number available for                            emergencies. The signs and symptoms of potential                            delayed complications were discussed with the                            patient. Return to normal activities tomorrow.                            Written discharge instructions were provided to the                            patient.                           - GERD prevention diet indefinitely.                           - Continue present medications. Will discuss trying                            alternative PPI.                           - Await pathology results.                           - Repeat upper endoscopy in 8 weeks to check                            healing.                           -  Consider hiatal hernia repair/fundoplication if                            symptoms persistent despite dietary changes/acid                            suppression Melaya Hoselton E. Tomasa Rand, MD 08/20/2023 4:21:19 PM This report has been signed electronically.

## 2023-08-20 NOTE — Progress Notes (Signed)
Repeat EGD scheduled. Instructions printed and reviewed with pt and care partner. Also sent to pt via MyChart.

## 2023-08-20 NOTE — Progress Notes (Signed)
Sedate, gd SR, tolerated procedure well, VSS, report to RN 

## 2023-08-20 NOTE — Progress Notes (Signed)
Called to room to assist during endoscopic procedure.  Patient ID and intended procedure confirmed with present staff. Received instructions for my participation in the procedure from the performing physician.  

## 2023-08-20 NOTE — Progress Notes (Signed)
Pt reported being nauseous in PACU. Zofran given by CRNA. Patient reports nausea has gotten better prior to discharge.

## 2023-08-20 NOTE — Progress Notes (Signed)
Pt's states no medical or surgical changes since previsit or office visit. 

## 2023-08-20 NOTE — Op Note (Signed)
[facility] Patient Name: Jean Hernandez Procedure Date: 08/20/2023 3:27 PM MRN: [id number] Endoscopist: Lorin Picket E. Tomasa Rand , MD, [id number] Age: 52 Referring MD:  Date of Birth: 10/10/1971 Gender: Female Account #: [id number] Procedure:                Colonoscopy Indications:              Screening for colorectal malignant neoplasm (last                            colonoscopy was more than 10 years ago) Medicines:                Monitored Anesthesia Care Procedure:                Pre-Anesthesia Assessment:                           - Prior to the procedure, a History and Physical                            was performed, and patient medications and                            allergies were reviewed. The patient's tolerance of                            previous anesthesia was also reviewed. The risks                            and benefits of the procedure and the sedation                            options and risks were discussed with the patient.                            All questions were answered, and informed consent                            was obtained. Prior Anticoagulants: The patient has                            taken no anticoagulant or antiplatelet agents. ASA                            Grade Assessment: II - A patient with mild systemic                            disease. After reviewing the risks and benefits,                            the patient was deemed in satisfactory condition to                            undergo the procedure.  After obtaining informed consent, the colonoscope                            was passed under direct vision. Throughout the                            procedure, the patient's blood pressure, pulse, and                            oxygen saturations were monitored continuously. The                            CF [id number] #[id number] was introduced through the anus                            and advanced  to the the cecum, identified by                            appendiceal orifice and ileocecal valve. The                            colonoscopy was performed with moderate difficulty                            due to restricted mobility of the colon and a                            tortuous colon. The patient tolerated the procedure                            well. The quality of the bowel preparation was fair                            in that there was copious adherent liquid stool in                            the ascending and transverse colon. The ileocecal                            valve, appendiceal orifice, and rectum were                            photographed. The bowel preparation used was SUPREP                            via split dose instruction. Scope In: 3:42:40 PM Scope Out: 4:08:51 PM Scope Withdrawal Time: 0 hours 19 minutes 5 seconds  Total Procedure Duration: 0 hours 26 minutes 11 seconds  Findings:                 The perianal and digital rectal examinations were                            normal. Mild prep-related  skin irritation was                            noted. Pertinent negatives include normal sphincter                            tone and no palpable rectal lesions.                           A 3 mm polyp was found in the distal transverse                            colon. The polyp was sessile. The polyp was removed                            with a cold snare. Resection and retrieval were                            complete. Estimated blood loss was minimal.                           The exam was otherwise normal throughout the                            examined colon.                           The retroflexed view of the distal rectum and anal                            verge was normal and showed no anal or rectal                            abnormalities. Complications:            No immediate complications. Estimated Blood Loss:     Estimated blood  loss was minimal. Impression:               - Preparation of the colon was fair.                           - One 3 mm polyp in the distal transverse colon,                            removed with a cold snare. Resected and retrieved.                           - The distal rectum and anal verge are normal on                            retroflexion view. Recommendation:           - Patient has a contact number available for  emergencies. The signs and symptoms of potential                            delayed complications were discussed with the                            patient. Return to normal activities tomorrow.                            Written discharge instructions were provided to the                            patient.                           - Resume previous diet.                           - Continue present medications.                           - Await pathology results.                           - Repeat colonoscopy in 3 - 5 years because the                            bowel preparation was suboptimal.                           - Recommend use of pediatric colonoscope for                            subsequent colonoscopy. Linn Goetze E. Tomasa Rand, MD 08/20/2023 4:28:22 PM This report has been signed electronically.

## 2023-08-20 NOTE — Patient Instructions (Signed)
  Educational handout provided to patient related to Polyps and Hiatal Hernia  GERD prevention diet indefinitely  Continue present medications  Awaiting pathology results  YOU HAD AN ENDOSCOPIC PROCEDURE TODAY AT THE [facility]:   Refer to the procedure report that was given to you for any specific questions about what was found during the examination.  If the procedure report does not answer your questions, please call your gastroenterologist to clarify.  If you requested that your care partner not be given the details of your procedure findings, then the procedure report has been included in a sealed envelope for you to review at your convenience later.  YOU SHOULD EXPECT: Some feelings of bloating in the abdomen. Passage of more gas than usual.  Walking can help get rid of the air that was put into your GI tract during the procedure and reduce the bloating. If you had a lower endoscopy (such as a colonoscopy or flexible sigmoidoscopy) you may notice spotting of blood in your stool or on the toilet paper. If you underwent a bowel prep for your procedure, you may not have a normal bowel movement for a few days.  Please Note:  You might notice some irritation and congestion in your nose or some drainage.  This is from the oxygen used during your procedure.  There is no need for concern and it should clear up in a day or so.  SYMPTOMS TO REPORT IMMEDIATELY:  Following lower endoscopy (colonoscopy or flexible sigmoidoscopy):  Excessive amounts of blood in the stool  Significant tenderness or worsening of abdominal pains  Swelling of the abdomen that is new, acute  Fever of 100F or higher  Following upper endoscopy (EGD)  Vomiting of blood or coffee ground material  New chest pain or pain under the shoulder blades  Painful or persistently difficult swallowing  New shortness of breath  Fever of 100F or higher  Black, tarry-looking stools  For urgent or emergent issues, a  gastroenterologist can be reached at any hour by calling [phone]. Do not use MyChart messaging for urgent concerns.    DIET:  We do recommend a small meal at first, but then you may proceed to your regular diet.  Drink plenty of fluids but you should avoid alcoholic beverages for 24 hours.  ACTIVITY:  You should plan to take it easy for the rest of today and you should NOT DRIVE or use heavy machinery until tomorrow (because of the sedation medicines used during the test).    FOLLOW UP: Our staff will call the number listed on your records the next business day following your procedure.  We will call around 7:15- 8:00 am to check on you and address any questions or concerns that you may have regarding the information given to you following your procedure. If we do not reach you, we will leave a message.     If any biopsies were taken you will be contacted by phone or by letter within the next 1-3 weeks.  Please call us at [phone] if you have not heard about the biopsies in 3 weeks.    SIGNATURES/CONFIDENTIALITY: You and/or your care partner have signed paperwork which will be entered into your electronic medical record.  These signatures attest to the fact that that the information above on your After Visit Summary has been reviewed and is understood.  Full responsibility of the confidentiality of this discharge information lies with you and/or your care-partner.

## 2023-08-21 ENCOUNTER — Telehealth: Payer: Self-pay | Admitting: *Deleted

## 2023-08-21 DIAGNOSIS — K219 Gastro-esophageal reflux disease without esophagitis: Secondary | ICD-10-CM

## 2023-08-21 MED ORDER — ESOMEPRAZOLE MAGNESIUM 40 MG PO CPDR: 40.0000 mg | DELAYED_RELEASE_CAPSULE | Freq: Two times a day (BID) | ORAL | 1 refills | Status: DC

## 2023-08-21 NOTE — Telephone Encounter (Signed)
Pt requesting Rx for Nexium be sent to [facility].  They were unable to have it filled at [facility] last night. Sent Rx for Nexium to [facility].   She also had questions about her report and f/u EGD in January. Pt c/o of sore throat.  Advised her to take tylenol or use throat spray.

## 2023-08-21 NOTE — Telephone Encounter (Signed)
Patient called stating that she is still having issues with getting Nexium and is requesting a call to discuss. Please advise.

## 2023-08-21 NOTE — Telephone Encounter (Signed)
Left message on f/u call 

## 2023-08-21 NOTE — Telephone Encounter (Signed)
Phoned [facility] and spoke with [place].  She states that a prior authorization is needed for this medication for the patient.  They will be sending this to the office for them to take care of.  She says that the patient can get it without PA for $20.  Called patient and explained  She will wait on PA from our office.

## 2023-08-21 NOTE — Telephone Encounter (Signed)
Patient returned call, requesting to speak with nurse. Please advise.

## 2023-08-21 NOTE — Addendum Note (Signed)
Addended by: Darrel Hoover on: 08/21/2023 08:57 AM   Modules accepted: Orders

## 2023-08-23 ENCOUNTER — Telehealth: Payer: Self-pay

## 2023-08-23 NOTE — Telephone Encounter (Signed)
Referral and records faxed to [facility] as requested.

## 2023-08-23 NOTE — Telephone Encounter (Signed)
-----   Message from Jenel Lucks sent at 08/22/2023 12:54 PM EST ----- Regarding: Surgery referral Bonita Quin,  Can you please place a referral to surgery for consideration of hiatal hernia repair/fundoplication.  Please note patient's BMI is 38.  Thanks

## 2023-08-25 ENCOUNTER — Encounter: Payer: Self-pay | Admitting: Gastroenterology

## 2023-08-25 DIAGNOSIS — J020 Streptococcal pharyngitis: Secondary | ICD-10-CM | POA: Diagnosis not present

## 2023-08-26 ENCOUNTER — Telehealth: Payer: Self-pay

## 2023-08-26 LAB — SURGICAL PATHOLOGY

## 2023-08-26 NOTE — Telephone Encounter (Signed)
*  Gastro  Pharmacy Patient Advocate Encounter   Received notification from CoverMyMeds that prior authorization for Esomeprazole Magnesium 40MG  dr capsules  is required/requested.   Insurance verification completed.   The patient is insured through [facility] .   Per test claim: PA required; PA started via CoverMyMeds. KEY [id number] . Waiting for clinical questions to populate.

## 2023-08-27 ENCOUNTER — Other Ambulatory Visit: Payer: Self-pay

## 2023-08-27 MED ORDER — LIDOCAINE VISCOUS HCL 2 % MT SOLN
OROMUCOSAL | 0 refills | Status: DC
Start: 2023-08-27 — End: 2023-10-18

## 2023-08-27 NOTE — Progress Notes (Signed)
Ms. Ohora,  The biopsies taken from your stomach were notable for mild reactive gastropathy which is a common finding and often related to use of certain medications (usually NSAIDs), but there was no evidence of Helicobacter pylori infection. This common finding is not felt to necessarily be a cause of any particular symptom and there is no specific treatment or further evaluation recommended.  The polyp resected from your stomach was a benign fundic gland polyps.  There was no evidence of infection with Helicobacter pylori or intestinal metaplasia/dysplasia.  These types of polyps are typically secondary to acid suppression therapy and no specific follow-up required for these small, benign polyps.  The polyp which I removed during your colonoscopy was proven to be completely benign but is considered a "pre-cancerous" polyp that MAY have grown into cancer if it had not been removed.  Studies shows that at least 20% of women over age 37 and 30% of men over age 40 have pre-cancerous polyps.  Because of the suboptimal quality of the bowel prep, I would recommend you repeat colonoscopy in 3 years.

## 2023-08-29 ENCOUNTER — Telehealth: Payer: Self-pay | Admitting: Gastroenterology

## 2023-08-29 ENCOUNTER — Other Ambulatory Visit (HOSPITAL_COMMUNITY): Payer: Self-pay

## 2023-08-29 NOTE — Telephone Encounter (Signed)
Pt notified via mychart

## 2023-08-29 NOTE — Telephone Encounter (Signed)
Prior auth approved for Nexium.

## 2023-08-29 NOTE — Telephone Encounter (Signed)
Patient called and stated that she want to know where the office and pharmacy stand regarding her medication Nexium. She also states that if she is not wanting the EGD if the situation with the Nexium is not clarified. Also Pharmacy states the medication is not covered but the providers office states it is covered.Please advise.

## 2023-08-29 NOTE — Telephone Encounter (Signed)
Pharmacy Patient Advocate Encounter  Received notification from [facility] that Prior Authorization for Nexium 40 mg dr capsules has been APPROVED from 08/29/2023 to 08/28/2024   PA #/Case ID/Reference #: [id number]

## 2023-08-29 NOTE — Telephone Encounter (Signed)
Plesae see note below from pt, she is wanting to know the status of her Nexium prescription

## 2023-08-30 NOTE — Telephone Encounter (Signed)
Pt had EGD done on 11/5. She has not been able to start on the Nexium until today due to it needing a PA. She was supposed to be scheduled for an EGD 8 weeks after taking the Nexium. She is currently scheduled for repeat EGD on 1/3 which will be 7 weeks on the nexium. Does she need to be pushed out for just one week? Please advise.

## 2023-09-18 ENCOUNTER — Other Ambulatory Visit: Payer: Self-pay | Admitting: General Surgery

## 2023-09-18 ENCOUNTER — Other Ambulatory Visit (HOSPITAL_COMMUNITY): Payer: Self-pay | Admitting: General Surgery

## 2023-09-18 DIAGNOSIS — R112 Nausea with vomiting, unspecified: Secondary | ICD-10-CM | POA: Diagnosis not present

## 2023-09-18 DIAGNOSIS — K449 Diaphragmatic hernia without obstruction or gangrene: Secondary | ICD-10-CM | POA: Diagnosis not present

## 2023-09-18 DIAGNOSIS — R053 Chronic cough: Secondary | ICD-10-CM | POA: Diagnosis not present

## 2023-09-18 DIAGNOSIS — K219 Gastro-esophageal reflux disease without esophagitis: Secondary | ICD-10-CM

## 2023-09-23 ENCOUNTER — Other Ambulatory Visit: Payer: Self-pay

## 2023-09-23 DIAGNOSIS — K219 Gastro-esophageal reflux disease without esophagitis: Secondary | ICD-10-CM

## 2023-09-23 DIAGNOSIS — K449 Diaphragmatic hernia without obstruction or gangrene: Secondary | ICD-10-CM

## 2023-09-23 NOTE — Progress Notes (Signed)
Jean Lucks, MD  Jean Nose, RN Jean Hernandez, Can you please get Jean Hernandez set up for an esophageal manometry study? She was seen by Dr. Andrey Campanile for consideration of hiatal hernia/fundoplication and he requested she have this study done as part of the pre-operative work up  Thanks  Pt scheduled for EM at [facility] 02/05/24 at 10:30am. Case#[id number]. Pt notified of appt and instructions via mychart/

## 2023-10-01 ENCOUNTER — Encounter (HOSPITAL_COMMUNITY): Payer: Federal, State, Local not specified - PPO

## 2023-10-18 ENCOUNTER — Encounter: Payer: Self-pay | Admitting: Gastroenterology

## 2023-10-18 ENCOUNTER — Ambulatory Visit (AMBULATORY_SURGERY_CENTER): Payer: Federal, State, Local not specified - PPO | Admitting: Gastroenterology

## 2023-10-18 VITALS — BP 121/71 | HR 77 | Temp 98.1°F | Resp 19 | Ht 65.0 in | Wt 233.0 lb

## 2023-10-18 DIAGNOSIS — K2100 Gastro-esophageal reflux disease with esophagitis, without bleeding: Secondary | ICD-10-CM | POA: Diagnosis not present

## 2023-10-18 DIAGNOSIS — K317 Polyp of stomach and duodenum: Secondary | ICD-10-CM

## 2023-10-18 DIAGNOSIS — K219 Gastro-esophageal reflux disease without esophagitis: Secondary | ICD-10-CM

## 2023-10-18 DIAGNOSIS — K222 Esophageal obstruction: Secondary | ICD-10-CM

## 2023-10-18 DIAGNOSIS — K449 Diaphragmatic hernia without obstruction or gangrene: Secondary | ICD-10-CM

## 2023-10-18 MED ORDER — SODIUM CHLORIDE 0.9 % IV SOLN: 500.0000 mL | Freq: Once | INTRAVENOUS | Status: DC

## 2023-10-18 NOTE — Op Note (Signed)
 [facility] Patient Name: Jean Hernandez Procedure Date: 10/18/2023 9:53 AM MRN: [id number] Endoscopist: Glendia E. Stacia , MD, [id number] Age: 53 Referring MD:  Date of Birth: January 23, 1971 Gender: Female Account #: [id number] Procedure:                Upper GI endoscopy Indications:              Follow-up of reflux esophagitis; symptoms improved                            since last visit on BID Nexium  Medicines:                Monitored Anesthesia Care Procedure:                Pre-Anesthesia Assessment:                           - Prior to the procedure, a History and Physical                            was performed, and patient medications and                            allergies were reviewed. The patient's tolerance of                            previous anesthesia was also reviewed. The risks                            and benefits of the procedure and the sedation                            options and risks were discussed with the patient.                            All questions were answered, and informed consent                            was obtained. Prior Anticoagulants: The patient has                            taken no anticoagulant or antiplatelet agents. ASA                            Grade Assessment: III - A patient with severe                            systemic disease. After reviewing the risks and                            benefits, the patient was deemed in satisfactory                            condition to undergo the procedure.  After obtaining informed consent, the endoscope was                            passed under direct vision. Throughout the                            procedure, the patient's blood pressure, pulse, and                            oxygen saturations were monitored continuously. The                            GIF HQ190 #[id number] was introduced through the                            mouth, and advanced  to the second part of duodenum.                            The upper GI endoscopy was accomplished without                            difficulty. The patient tolerated the procedure                            well. Scope In: Scope Out: Findings:                 One benign-appearing, intrinsic mild stenosis was                            found. This stenosis measured 1.3 cm (inner                            diameter) x less than one cm (in length). The                            stenosis was traversed.                           The exam of the esophagus was otherwise normal. The                            previously noted esophagitis has healed.                           The gastroesophageal flap valve was visualized                            endoscopically and classified as Hill Grade IV (no                            fold, wide open lumen, hiatal hernia present).                           A 6 cm hiatal hernia was  present.                           A few small sessile polyps were found in the                            gastric body.                           The exam of the stomach was otherwise normal.                           The examined duodenum was normal. Complications:            No immediate complications. Estimated Blood Loss:     Estimated blood loss: none. Impression:               - Benign-appearing esophageal stenosis/ring.                           - Gastroesophageal flap valve classified as Hill                            Grade IV (no fold, wide open lumen, hiatal hernia                            present).                           - 6 cm hiatal hernia.                           - A few gastric polyps. These were consistent with                            fundic gland polyps.                           - Normal examined duodenum.                           - No specimens collected. Recommendation:           - Patient has a contact number available for                             emergencies. The signs and symptoms of potential                            delayed complications were discussed with the                            patient. Return to normal activities tomorrow.                            Written discharge instructions were provided to the  patient.                           - Resume previous diet.                           - Continue present medications. Continue Nexium  at                            lowest effective dose                           - Await pathology results.                           - Follow up as needed with surgery if                            fundoplication desired. Carriann Hesse E. Stacia, MD 10/18/2023 10:28:42 AM This report has been signed electronically.

## 2023-10-18 NOTE — Progress Notes (Signed)
 Pt's states no medical or surgical changes since previsit or office visit.

## 2023-10-18 NOTE — Patient Instructions (Signed)
 Handouts provided about hiatal hernia and polyps.   Resume previous diet. Continue present medications. Continue Nexium  at lowest effective dose. Await pathology results Follow up as needed with surgery if fundoplication desired.  YOU HAD AN ENDOSCOPIC PROCEDURE TODAY AT THE  ENDOSCOPY CENTER:   Refer to the procedure report that was given to you for any specific questions about what was found during the examination.  If the procedure report does not answer your questions, please call your gastroenterologist to clarify.  If you requested that your care partner not be given the details of your procedure findings, then the procedure report has been included in a sealed envelope for you to review at your convenience later.  YOU SHOULD EXPECT: Some feelings of bloating in the abdomen. Passage of more gas than usual.  Walking can help get rid of the air that was put into your GI tract during the procedure and reduce the bloating. If you had a lower endoscopy (such as a colonoscopy or flexible sigmoidoscopy) you may notice spotting of blood in your stool or on the toilet paper. If you underwent a bowel prep for your procedure, you may not have a normal bowel movement for a few days.  Please Note:  You might notice some irritation and congestion in your nose or some drainage.  This is from the oxygen used during your procedure.  There is no need for concern and it should clear up in a day or so.  SYMPTOMS TO REPORT IMMEDIATELY:   Following upper endoscopy (EGD)  Vomiting of blood or coffee ground material  New chest pain or pain under the shoulder blades  Painful or persistently difficult swallowing  New shortness of breath  Fever of 100F or higher  Black, tarry-looking stools  For urgent or emergent issues, a gastroenterologist can be reached at any hour by calling [phone]. Do not use MyChart messaging for urgent concerns.    DIET:  We do recommend a small meal at first, but then  you may proceed to your regular diet.  Drink plenty of fluids but you should avoid alcoholic beverages for 24 hours.  ACTIVITY:  You should plan to take it easy for the rest of today and you should NOT DRIVE or use heavy machinery until tomorrow (because of the sedation medicines used during the test).    FOLLOW UP: Our staff will call the number listed on your records the next business day following your procedure.  We will call around 7:15- 8:00 am to check on you and address any questions or concerns that you may have regarding the information given to you following your procedure. If we do not reach you, we will leave a message.     If any biopsies were taken you will be contacted by phone or by letter within the next 1-3 weeks.  Please call us  at [phone] if you have not heard about the biopsies in 3 weeks.    SIGNATURES/CONFIDENTIALITY: You and/or your care partner have signed paperwork which will be entered into your electronic medical record.  These signatures attest to the fact that that the information above on your After Visit Summary has been reviewed and is understood.  Full responsibility of the confidentiality of this discharge information lies with you and/or your care-partner.

## 2023-10-18 NOTE — Progress Notes (Signed)
  Gastroenterology History and Physical   Primary Care Physician:  Zollie Lowers, MD   Reason for Procedure:   Follow up reflux esophagitis  Plan:    EGD     HPI: Jean Hernandez is a 53 y.o. female undergoing repeat EGD to assess healing of reflux esophagitis.  She was found to have LA Grade B reflux on EGD in November.   Her symptoms are much improved on Nexium  40 mg PO BID.   Past Medical History:  Diagnosis Date   Anxiety    Asthma    Allergy  induced   GERD (gastroesophageal reflux disease)    Hypothyroidism    Rheumatoid arthritis (HCC)    Sleep apnea    mild no mask    Past Surgical History:  Procedure Laterality Date   ABDOMINAL HYSTERECTOMY     many surgeries for this   C sections     x2   colonsocopy  2012   TOTAL KNEE ARTHROPLASTY Left 09/28/2019   Procedure: TOTAL KNEE ARTHROPLASTY;  Surgeon: Melodi Lerner, MD;  Location: [facility];  Service: Orthopedics;  Laterality: Left;     Prior to Admission medications   Medication Sig Start Date End Date Taking? Authorizing Provider  Calcium Citrate (CITRACAL PO) Take 1 tablet by mouth 2 (two) times daily.   Yes [provider]  cetirizine  (ZYRTEC ) 10 MG tablet Take 10 mg by mouth daily.   Yes [provider]  fluticasone  (FLONASE ) 50 MCG/ACT nasal spray Place 1 spray into both nostrils daily. 02/21/23  Yes Kara Dorn NOVAK, MD  leflunomide  (ARAVA ) 20 MG tablet Take 20 mg by mouth daily. 12/27/16  Yes [provider]  lidocaine  (XYLOCAINE ) 2 % solution Swish and gargle 10 mL prior to meals/snacks.  May spit or swallow. 08/27/23   Stacia Glendia FORBES, MD  montelukast  (SINGULAIR ) 10 MG tablet Take 1 tablet (10 mg total) by mouth at bedtime. 02/21/23  Yes Kara Dorn NOVAK, MD  Semaglutide -Weight Management 0.25 MG/0.5ML SOAJ Inject 0.25 mg into the skin. 09/18/23  Yes [provider]  albuterol  (VENTOLIN  HFA) 108 (90 Base) MCG/ACT inhaler Inhale 1-2 puffs into the lungs every 6  (six) hours as needed for wheezing or shortness of breath. 02/21/23   Kara Dorn NOVAK, MD  Certolizumab Pegol  (CIMZIA  [place]) Inject into the skin.    [provider]  Cholecalciferol (VITAMIN D-1000 MAX ST) 25 MCG (1000 UT) tablet Take 1,000 Units by mouth daily. Patient not taking: Reported on 08/20/2023    [provider]  esomeprazole  (NEXIUM ) 40 MG capsule Take 1 capsule (40 mg total) by mouth in the morning and at bedtime. Replace protonix  with Nexium .  Take 2 times daily. 08/21/23   Stacia Glendia FORBES, MD  fluticasone -salmeterol (ADVAIR HFA) 115-21 MCG/ACT inhaler Inhale 2 puffs into the lungs 2 (two) times daily. 02/21/23   Kara Dorn NOVAK, MD  naproxen  (NAPROSYN ) 500 MG tablet Take 1 tablet (500 mg total) by mouth 2 (two) times daily with a meal. Patient not taking: Reported on 08/20/2023 11/09/22   Gladis Mustard, FNP    Current Outpatient Medications  Medication Sig Dispense Refill   Calcium Citrate (CITRACAL PO) Take 1 tablet by mouth 2 (two) times daily.     cetirizine  (ZYRTEC ) 10 MG tablet Take 10 mg by mouth daily.     fluticasone  (FLONASE ) 50 MCG/ACT nasal spray Place 1 spray into both nostrils daily. 16 g 2   leflunomide  (ARAVA ) 20 MG tablet Take 20 mg by mouth  daily.     lidocaine  (XYLOCAINE ) 2 % solution Swish and gargle 10 mL prior to meals/snacks.  May spit or swallow. 100 mL 0   montelukast  (SINGULAIR ) 10 MG tablet Take 1 tablet (10 mg total) by mouth at bedtime. 30 tablet 11   Semaglutide -Weight Management 0.25 MG/0.5ML SOAJ Inject 0.25 mg into the skin.     albuterol  (VENTOLIN  HFA) 108 (90 Base) MCG/ACT inhaler Inhale 1-2 puffs into the lungs every 6 (six) hours as needed for wheezing or shortness of breath. 8 g 6   Certolizumab Pegol  (CIMZIA  [place]) Inject into the skin.     Cholecalciferol (VITAMIN D-1000 MAX ST) 25 MCG (1000 UT) tablet Take 1,000 Units by mouth daily. (Patient not taking: Reported on 08/20/2023)     esomeprazole  (NEXIUM ) 40 MG capsule  Take 1 capsule (40 mg total) by mouth in the morning and at bedtime. Replace protonix  with Nexium .  Take 2 times daily. 180 capsule 1   fluticasone -salmeterol (ADVAIR HFA) 115-21 MCG/ACT inhaler Inhale 2 puffs into the lungs 2 (two) times daily. 1 each 12   naproxen  (NAPROSYN ) 500 MG tablet Take 1 tablet (500 mg total) by mouth 2 (two) times daily with a meal. (Patient not taking: Reported on 08/20/2023) 60 tablet 1   Current Facility-Administered Medications  Medication Dose Route Frequency Provider Last Rate Last Admin   0.9 %  sodium chloride  infusion  500 mL Intravenous Once Stacia Glendia BRAVO, MD        Allergies as of 10/18/2023 - Review Complete 10/18/2023  Allergen Reaction Noted   Amoxicillin  Itching and Rash 08/07/2012   Other Itching 11/23/2014   Oxycodone-acetaminophen  Itching and Rash 11/23/2014   Percocet [oxycodone-acetaminophen ] Itching and Rash 08/07/2012   Ultracet [tramadol-acetaminophen ] Itching, Rash, and Hives    Vicodin [hydrocodone-acetaminophen ] Itching and Hives 11/23/2014   Darvon [propoxyphene]  05/10/2023   Propoxyphene n-acetaminophen       Family History  Problem Relation Age of Onset   Hypertension Mother    Allergies Mother    Diabetes Father    Dementia Father    Heart disease Father    Early death Father    Allergies Father    Allergies Sister     Social History   Socioeconomic History   Marital status: Married    Spouse name: Not on file   Number of children: Not on file   Years of education: Not on file   Highest education level: Not on file  Occupational History   Not on file  Tobacco Use   Smoking status: Never   Smokeless tobacco: Never  Vaping Use   Vaping status: Never Used  Substance and Sexual Activity   Alcohol use: No    Alcohol/week: 0.0 standard drinks of alcohol   Drug use: No   Sexual activity: Yes  Other Topics Concern   Not on file  Social History Narrative   Not on file   Social Drivers of Health    Financial Resource Strain: Not on file  Food Insecurity: Not on file  Transportation Needs: Not on file  Physical Activity: Not on file  Stress: Not on file  Social Connections: Unknown (02/25/2022)   Received from [facility], [facility]   Social Network    Social Network: Not on file  Intimate Partner Violence: Unknown (01/17/2022)   Received from [facility], [facility]   HITS    Physically Hurt: Not on file    Insult or Talk Down To: Not on file  Threaten Physical Harm: Not on file    Scream or Curse: Not on file    Review of Systems:  All other review of systems negative except as mentioned in the HPI.  Physical Exam: Vital signs BP (!) 158/96   Pulse 87   Temp 98.1 F (36.7 C) (Temporal)   Ht 5' 5 (1.651 m)   Wt 233 lb (105.7 kg)   SpO2 96%   BMI 38.77 kg/m   General:   Alert,  Well-developed, well-nourished, pleasant and cooperative in NAD Airway:  Mallampati 3 Lungs:  Clear throughout to auscultation.   Heart:  Regular rate and rhythm; no murmurs, clicks, rubs,  or gallops. Abdomen:  Soft, nontender and nondistended. Normal bowel sounds.   Neuro/Psych:  Normal mood and affect. A and O x 3   Kristi Hyer E. Stacia, MD [facility] Gastroenterology

## 2023-10-18 NOTE — Progress Notes (Signed)
 Vss nad trans to pacu

## 2023-10-21 ENCOUNTER — Telehealth: Payer: Self-pay | Admitting: *Deleted

## 2023-10-21 NOTE — Telephone Encounter (Signed)
  Follow up Call-     10/18/2023    9:10 AM 08/20/2023    1:58 PM  Call back number  Post procedure Call Back phone  # [phone]  Permission to leave phone message Yes Yes     Patient questions:  Do you have a fever, pain , or abdominal swelling? No. Pain Score  0 *  Have you tolerated food without any problems? Yes.    Have you been able to return to your normal activities? Yes.    Do you have any questions about your discharge instructions: Diet   No. Medications  No. Follow up visit  No.  Do you have questions or concerns about your Care? Returned the patient's phone call She was doing fine from the procedure standpoint but wanted to address some concerns regarding the number of IV attempt sticks and the wait time from check in to procedure time start. Will notify Avelina Shoulder our Practice Administrator and Dr. Stacia.  Actions: * If pain score is 4 or above: No action needed, pain <4.

## 2023-10-31 DIAGNOSIS — M0579 Rheumatoid arthritis with rheumatoid factor of multiple sites without organ or systems involvement: Secondary | ICD-10-CM | POA: Diagnosis not present

## 2023-10-31 DIAGNOSIS — R5383 Other fatigue: Secondary | ICD-10-CM | POA: Diagnosis not present

## 2023-11-20 DIAGNOSIS — E88819 Insulin resistance, unspecified: Secondary | ICD-10-CM | POA: Diagnosis not present

## 2023-11-20 DIAGNOSIS — Z131 Encounter for screening for diabetes mellitus: Secondary | ICD-10-CM | POA: Diagnosis not present

## 2023-11-20 DIAGNOSIS — E559 Vitamin D deficiency, unspecified: Secondary | ICD-10-CM | POA: Diagnosis not present

## 2023-11-20 DIAGNOSIS — E7800 Pure hypercholesterolemia, unspecified: Secondary | ICD-10-CM | POA: Diagnosis not present

## 2023-11-20 DIAGNOSIS — Z79899 Other long term (current) drug therapy: Secondary | ICD-10-CM | POA: Diagnosis not present

## 2023-11-20 DIAGNOSIS — D539 Nutritional anemia, unspecified: Secondary | ICD-10-CM | POA: Diagnosis not present

## 2023-11-20 DIAGNOSIS — E669 Obesity, unspecified: Secondary | ICD-10-CM | POA: Diagnosis not present

## 2023-11-20 DIAGNOSIS — R5383 Other fatigue: Secondary | ICD-10-CM | POA: Diagnosis not present

## 2023-11-20 DIAGNOSIS — R0602 Shortness of breath: Secondary | ICD-10-CM | POA: Diagnosis not present

## 2023-11-29 DIAGNOSIS — M25561 Pain in right knee: Secondary | ICD-10-CM | POA: Diagnosis not present

## 2023-12-11 DIAGNOSIS — G4710 Hypersomnia, unspecified: Secondary | ICD-10-CM | POA: Diagnosis not present

## 2023-12-11 DIAGNOSIS — Z6836 Body mass index (BMI) 36.0-36.9, adult: Secondary | ICD-10-CM | POA: Diagnosis not present

## 2023-12-11 DIAGNOSIS — G4709 Other insomnia: Secondary | ICD-10-CM | POA: Diagnosis not present

## 2023-12-13 DIAGNOSIS — R059 Cough, unspecified: Secondary | ICD-10-CM | POA: Diagnosis not present

## 2023-12-13 DIAGNOSIS — J029 Acute pharyngitis, unspecified: Secondary | ICD-10-CM | POA: Diagnosis not present

## 2023-12-13 DIAGNOSIS — U071 COVID-19: Secondary | ICD-10-CM | POA: Diagnosis not present

## 2023-12-18 DIAGNOSIS — E538 Deficiency of other specified B group vitamins: Secondary | ICD-10-CM | POA: Diagnosis not present

## 2023-12-18 DIAGNOSIS — G4709 Other insomnia: Secondary | ICD-10-CM | POA: Diagnosis not present

## 2023-12-18 DIAGNOSIS — E669 Obesity, unspecified: Secondary | ICD-10-CM | POA: Diagnosis not present

## 2023-12-18 DIAGNOSIS — E88819 Insulin resistance, unspecified: Secondary | ICD-10-CM | POA: Diagnosis not present

## 2024-01-02 ENCOUNTER — Encounter: Payer: Self-pay | Admitting: Family Medicine

## 2024-01-02 ENCOUNTER — Other Ambulatory Visit: Payer: Self-pay | Admitting: Nurse Practitioner

## 2024-01-02 DIAGNOSIS — M1711 Unilateral primary osteoarthritis, right knee: Secondary | ICD-10-CM | POA: Diagnosis not present

## 2024-01-02 NOTE — Telephone Encounter (Signed)
 Stacks NTBS last OV 11/09/22 NO RF sent to pharmacy last OV greater than a year

## 2024-01-02 NOTE — Telephone Encounter (Signed)
NA letter mailed

## 2024-01-17 DIAGNOSIS — E538 Deficiency of other specified B group vitamins: Secondary | ICD-10-CM | POA: Diagnosis not present

## 2024-01-17 DIAGNOSIS — G4709 Other insomnia: Secondary | ICD-10-CM | POA: Diagnosis not present

## 2024-01-17 DIAGNOSIS — E88819 Insulin resistance, unspecified: Secondary | ICD-10-CM | POA: Diagnosis not present

## 2024-01-17 DIAGNOSIS — E669 Obesity, unspecified: Secondary | ICD-10-CM | POA: Diagnosis not present

## 2024-01-23 DIAGNOSIS — G4733 Obstructive sleep apnea (adult) (pediatric): Secondary | ICD-10-CM | POA: Diagnosis not present

## 2024-02-05 ENCOUNTER — Encounter (HOSPITAL_COMMUNITY): Admission: RE | Payer: Self-pay | Source: Home / Self Care

## 2024-02-05 ENCOUNTER — Ambulatory Visit (HOSPITAL_COMMUNITY)
Admission: RE | Admit: 2024-02-05 | Payer: Federal, State, Local not specified - PPO | Source: Home / Self Care | Admitting: Gastroenterology

## 2024-02-05 SURGERY — MANOMETRY, ESOPHAGUS
Anesthesia: Choice

## 2024-02-19 DIAGNOSIS — M1991 Primary osteoarthritis, unspecified site: Secondary | ICD-10-CM | POA: Diagnosis not present

## 2024-02-19 DIAGNOSIS — Z79899 Other long term (current) drug therapy: Secondary | ICD-10-CM | POA: Diagnosis not present

## 2024-02-19 DIAGNOSIS — M0579 Rheumatoid arthritis with rheumatoid factor of multiple sites without organ or systems involvement: Secondary | ICD-10-CM | POA: Diagnosis not present

## 2024-02-19 DIAGNOSIS — M818 Other osteoporosis without current pathological fracture: Secondary | ICD-10-CM | POA: Diagnosis not present

## 2024-05-19 DIAGNOSIS — Z1231 Encounter for screening mammogram for malignant neoplasm of breast: Secondary | ICD-10-CM | POA: Diagnosis not present

## 2024-05-19 DIAGNOSIS — Z13820 Encounter for screening for osteoporosis: Secondary | ICD-10-CM | POA: Diagnosis not present

## 2024-05-19 DIAGNOSIS — Z6837 Body mass index (BMI) 37.0-37.9, adult: Secondary | ICD-10-CM | POA: Diagnosis not present

## 2024-05-19 DIAGNOSIS — Z01419 Encounter for gynecological examination (general) (routine) without abnormal findings: Secondary | ICD-10-CM | POA: Diagnosis not present

## 2024-05-21 ENCOUNTER — Encounter: Payer: Self-pay | Admitting: Family Medicine

## 2024-05-21 ENCOUNTER — Ambulatory Visit: Admitting: Family Medicine

## 2024-05-21 ENCOUNTER — Telehealth: Payer: Self-pay | Admitting: *Deleted

## 2024-05-21 VITALS — BP 142/94 | HR 112 | Temp 97.2°F | Ht 65.0 in | Wt 220.6 lb

## 2024-05-21 DIAGNOSIS — M170 Bilateral primary osteoarthritis of knee: Secondary | ICD-10-CM

## 2024-05-21 DIAGNOSIS — E66812 Obesity, class 2: Secondary | ICD-10-CM | POA: Diagnosis not present

## 2024-05-21 DIAGNOSIS — M069 Rheumatoid arthritis, unspecified: Secondary | ICD-10-CM | POA: Diagnosis not present

## 2024-05-21 DIAGNOSIS — Z6836 Body mass index (BMI) 36.0-36.9, adult: Secondary | ICD-10-CM

## 2024-05-21 NOTE — Progress Notes (Signed)
 Patient ID: Jean Hernandez, female    DOB: 03/17/1971  Age: 53 y.o. MRN: [id number]  CC: Weight Management Screening   HPI Jean Hernandez presents for Started weight loss program in January and lost 18 lb. Was taking shots weekly and a pill  daily. Pt. Limited diet via fit bit app. Making better choices, avoiding fatty, fried, and sugary foods. Avoiding fast foods. Still following this, but had to go off the shots due to increase in price to $700 per month. Walking for exercise. Has a treadmill at home and at work doing at least 2 miles several times a week. Weight loss plateaued in spite of her efforts.   Trying to lose weight to take weight off of her knees because she has had left replaced and needs to have the right knee replaced.  She has rheumatoid arthritis and the extra weight contributes significantly to her pain morbidity  Pt. Contacted her insurance She can get a 3 month supply of wegovy  for $125 if approved.  Insurance did not give her any still criteria they use for approval Prior auth #      05/21/2024    8:25 AM 05/21/2024    8:12 AM 11/09/2022    8:47 AM  Depression screen PHQ 2/9  Decreased Interest 0  0  Down, Depressed, Hopeless 0 0 0  PHQ - 2 Score 0 0 0  Altered sleeping 3  3  Tired, decreased energy 1  1  Change in appetite 1  1  Feeling bad or failure about yourself  0  0  Trouble concentrating 0  0  Moving slowly or fidgety/restless 0  0  Suicidal thoughts 0  0  PHQ-9 Score 5  5  Difficult doing work/chores Not difficult at all  Not difficult at all    History Jean Hernandez has a past medical history of Anxiety, Asthma, GERD (gastroesophageal reflux disease), Hypothyroidism, Rheumatoid arthritis (HCC), and Sleep apnea.   She has a past surgical history that includes C sections; colonsocopy (2012); Abdominal hysterectomy; and Total knee arthroplasty (Left, 09/28/2019).   Her family history includes Allergies in her father, mother, and sister; Dementia in her father;  Diabetes in her father; Early death in her father; Heart disease in her father; Hypertension in her mother.She reports that she has never smoked. She has never used smokeless tobacco. She reports that she does not drink alcohol and does not use drugs.    ROS Review of Systems  Constitutional: Negative.   HENT: Negative.    Eyes:  Negative for visual disturbance.  Respiratory:  Negative for shortness of breath.   Cardiovascular:  Negative for chest pain.  Gastrointestinal:  Negative for abdominal pain.  Musculoskeletal:  Negative for arthralgias.    Objective:  BP (!) 142/94   Pulse (!) 112   Temp (!) 97.2 F (36.2 C)   Ht 5' 5 (1.651 m)   Wt 220 lb 9.6 oz (100.1 kg)   SpO2 96%   BMI 36.71 kg/m   BP Readings from Last 3 Encounters:  05/21/24 (!) 142/94  10/18/23 121/71  08/20/23 (!) 148/85    Wt Readings from Last 3 Encounters:  05/21/24 220 lb 9.6 oz (100.1 kg)  10/18/23 233 lb (105.7 kg)  08/20/23 233 lb (105.7 kg)     Physical Exam Constitutional:      General: She is not in acute distress.    Appearance: She is well-developed.  Cardiovascular:     Rate and Rhythm: Normal rate  and regular rhythm.  Pulmonary:     Breath sounds: Normal breath sounds.  Musculoskeletal:        General: Normal range of motion.  Skin:    General: Skin is warm and dry.  Neurological:     Mental Status: She is alert and oriented to person, place, and time.      Assessment & Plan:  Class 2 severe obesity due to excess calories with serious comorbidity and body mass index (BMI) of 36.0 to 36.9 in adult [facility])  Rheumatoid arthritis involving multiple sites, unspecified whether rheumatoid factor present (HCC)  Primary osteoarthritis of both knees     Follow-up: No follow-ups on file.  Butler Der, M.D.

## 2024-05-21 NOTE — Telephone Encounter (Signed)
 Copied from CRM [id number]. Topic: Clinical - Medication Question >> May 21, 2024  9:38 AM Mia F wrote: Reason for CRM: Pt called instating she just saw Dr Zollie today and ways told to call back with the medications she took before. She says she took WEGOVY  .25MG  she says she was supposed to take the .50MG  but it was too expensive. She says she was also on a pill DIETHYLPROPION   75MG 

## 2024-05-24 ENCOUNTER — Encounter: Payer: Self-pay | Admitting: Family Medicine

## 2024-05-27 DIAGNOSIS — M818 Other osteoporosis without current pathological fracture: Secondary | ICD-10-CM | POA: Diagnosis not present

## 2024-05-27 NOTE — Telephone Encounter (Signed)
 Patient made aware via MyChart. ?

## 2024-05-27 NOTE — Telephone Encounter (Signed)
 The patient called back extremely frustrated as she still has not heard back from anyone regarding these medications and prior authorization. I spoke with Ezella and she saw the paper with the numbers listed on it that the patient left. She is going to personally take it back to the provider and his nurse and have the nurse contact the patient as soon as possible. Please assist patient further as soon as possible as she is very frustrated.

## 2024-05-27 NOTE — Telephone Encounter (Signed)
 Approval done. Med Ordered. Should arrive at her home Friday or Saturday, possibly by tomorrow.

## 2024-05-28 ENCOUNTER — Other Ambulatory Visit (HOSPITAL_COMMUNITY): Payer: Self-pay

## 2024-05-31 ENCOUNTER — Encounter (HOSPITAL_COMMUNITY): Payer: Self-pay

## 2024-05-31 ENCOUNTER — Emergency Department (HOSPITAL_COMMUNITY)
Admission: EM | Admit: 2024-05-31 | Discharge: 2024-05-31 | Disposition: A | Discharge status: Home / Self Care | Attending: Emergency Medicine | Admitting: Emergency Medicine

## 2024-05-31 DIAGNOSIS — R9431 Abnormal electrocardiogram [ECG] [EKG]: Secondary | ICD-10-CM | POA: Diagnosis not present

## 2024-05-31 DIAGNOSIS — R112 Nausea with vomiting, unspecified: Secondary | ICD-10-CM | POA: Insufficient documentation

## 2024-05-31 DIAGNOSIS — E039 Hypothyroidism, unspecified: Secondary | ICD-10-CM | POA: Diagnosis not present

## 2024-05-31 DIAGNOSIS — M62838 Other muscle spasm: Secondary | ICD-10-CM | POA: Diagnosis not present

## 2024-05-31 DIAGNOSIS — M62830 Muscle spasm of back: Secondary | ICD-10-CM | POA: Diagnosis not present

## 2024-05-31 DIAGNOSIS — E878 Other disorders of electrolyte and fluid balance, not elsewhere classified: Secondary | ICD-10-CM | POA: Diagnosis not present

## 2024-05-31 DIAGNOSIS — J45909 Unspecified asthma, uncomplicated: Secondary | ICD-10-CM | POA: Insufficient documentation

## 2024-05-31 LAB — CBC
HCT: 46.1 % — ABNORMAL HIGH (ref 36.0–46.0)
Hemoglobin: 16.1 g/dL — ABNORMAL HIGH (ref 12.0–15.0)
MCH: 31.4 pg (ref 26.0–34.0)
MCHC: 34.9 g/dL (ref 30.0–36.0)
MCV: 90 fL (ref 80.0–100.0)
Platelets: 242 K/uL (ref 150–400)
RBC: 5.12 MIL/uL — ABNORMAL HIGH (ref 3.87–5.11)
RDW: 12.2 % (ref 11.5–15.5)
WBC: 10.3 K/uL (ref 4.0–10.5)
nRBC: 0 % (ref 0.0–0.2)

## 2024-05-31 LAB — URINALYSIS, ROUTINE W REFLEX MICROSCOPIC
Bacteria, UA: NONE SEEN
Bilirubin Urine: NEGATIVE
Glucose, UA: NEGATIVE mg/dL
Hgb urine dipstick: NEGATIVE
Ketones, ur: 20 mg/dL — AB
Leukocytes,Ua: NEGATIVE
Nitrite: NEGATIVE
Protein, ur: 30 mg/dL — AB
Specific Gravity, Urine: 1.025 (ref 1.005–1.030)
pH: 6 (ref 5.0–8.0)

## 2024-05-31 LAB — COMPREHENSIVE METABOLIC PANEL WITH GFR
ALT: 27 U/L (ref 0–44)
AST: 29 U/L (ref 15–41)
Albumin: 4 g/dL (ref 3.5–5.0)
Alkaline Phosphatase: 67 U/L (ref 38–126)
Anion gap: 14 (ref 5–15)
BUN: 13 mg/dL (ref 6–20)
CO2: 19 mmol/L — ABNORMAL LOW (ref 22–32)
Calcium: 9.5 mg/dL (ref 8.9–10.3)
Chloride: 106 mmol/L (ref 98–111)
Creatinine, Ser: 0.91 mg/dL (ref 0.44–1.00)
GFR, Estimated: >60 mL/min (ref >60)
Glucose, Bld: 100 mg/dL — ABNORMAL HIGH (ref 70–99)
Potassium: 3.8 mmol/L (ref 3.5–5.1)
Sodium: 139 mmol/L (ref 135–145)
Total Bilirubin: 1 mg/dL (ref 0.0–1.2)
Total Protein: 7.8 g/dL (ref 6.5–8.1)

## 2024-05-31 LAB — LIPASE, BLOOD: Lipase: 42 U/L (ref 11–51)

## 2024-05-31 MED ORDER — ONDANSETRON HCL 4 MG/2ML IJ SOLN
4.0000 mg | Freq: Once | INTRAMUSCULAR | Status: AC
  Administered 2024-05-31: 4 mg via INTRAVENOUS
  Filled 2024-05-31: qty 2

## 2024-05-31 MED ORDER — ONDANSETRON 4 MG PO TBDP: 4.0000 mg | ORAL_TABLET | Freq: Once | ORAL | Status: DC | PRN

## 2024-05-31 MED ORDER — SODIUM CHLORIDE 0.9 % IV BOLUS
1000.0000 mL | Freq: Once | INTRAVENOUS | Status: AC
  Administered 2024-05-31: 1000 mL via INTRAVENOUS

## 2024-05-31 NOTE — ED Triage Notes (Signed)
 Patient reports taking Wegovy  1.7 and has been feeling sick ever since. Patient reports nausea with vomiting, tingling all over. She states her electrolytes may be off. Denies diarrhea or constipation.

## 2024-05-31 NOTE — Discharge Instructions (Addendum)
 It was a pleasure taking care of you today.  Based on your history, physical exam, and labs I feel you are stable for discharge.  Today your nausea improved with nausea medications in the emergency department and you were also given fluids for suspected dehydration.  After these medications you were able to tolerate food and liquids.  Please discontinue use of Wegovy  until talking to your primary care provider.  Please take your previously prescribed Zofran  if needed for nausea after discharge.  Please return the emergency department if you experience and the following symptoms including but not limited to fever, chills, severe abdominal pain, intractable nausea, vomiting, inability to eat/drink/tolerate oral medications.  Recommend follow-up with your primary care provider within 48 hours if symptoms persist or worsen or return to the emergency department.

## 2024-05-31 NOTE — ED Provider Notes (Signed)
 [facility] EMERGENCY DEPARTMENT AT [facility] Provider Note   CSN: [id number] Arrival date & time: 05/31/24  0600     Patient presents with: Emesis   Jean Hernandez is a 53 y.o. female who presents to the emergency department with a chief complaint of nausea and vomiting as well as some muscle spasms on her upper back and chest.  Patient states that she recently started taking Wegovy  at the 1.7mg  yesterday and starting have symptoms almost immediately after taking the medication. She states that she was previously on Wegovy  .25mg  but then insurance decided to no longer pay the medication so she has been off of it since April, however insurance stated they would approve the 1.7mg  dose through a mail order pharmacy. Patient states she took the 1.7mg  dose at approximately 8:30am yesterday and started having nausea soon after with her first episode of vomiting occurring around 12:30pm. Since then patient states she has not been able to tolerate anything by mouth including food or water , and states she has vomited approximately 10 times. Denies hematemesis. Denies fever, chills, chest pain, shortness of breath, abdominal pain, urinary symptoms, or diarrhea. Patient states that her last episode of vomiting was around 4:30am.  Patient has a past medical history significant for rheumatoid arthritis, sleep apnea, hypothyroidism, asthma, GERD, anxiety, etc.    Emesis      Prior to Admission medications   Medication Sig Start Date End Date Taking? Authorizing Provider  Calcium Citrate (CITRACAL PO) Take 1 tablet by mouth 2 (two) times daily.    [provider]  Certolizumab Pegol  (CIMZIA  [place]) Inject into the skin.    [provider]  cetirizine  (ZYRTEC ) 10 MG tablet Take 10 mg by mouth daily.    [provider]  Cholecalciferol (VITAMIN D-3) 125 MCG (5000 UT) TABS Take 1 tablet by mouth daily.    [provider]  cyanocobalamin  (VITAMIN B12) 1000 MCG tablet  Take 1,000 mcg by mouth daily.    [provider]  esomeprazole  (NEXIUM ) 40 MG capsule Take 1 capsule (40 mg total) by mouth in the morning and at bedtime. Replace protonix  with Nexium .  Take 2 times daily. 08/21/23   Stacia Glendia FORBES, MD  fluticasone  (FLONASE ) 50 MCG/ACT nasal spray Place 1 spray into both nostrils daily. 02/21/23   Kara Dorn NOVAK, MD  leflunomide  (ARAVA ) 20 MG tablet Take 20 mg by mouth daily. 12/27/16   [provider]  Magnesium  Glycinate 120 MG CAPS Take 2 capsules by mouth daily.    [provider]  Menaquinone-7 (VITAMIN K2) 100 MCG CAPS Take 1 capsule by mouth daily.    [provider]  montelukast  (SINGULAIR ) 10 MG tablet Take 1 tablet (10 mg total) by mouth at bedtime. Patient not taking: Reported on 05/21/2024 02/21/23   Kara Dorn NOVAK, MD  naproxen  (NAPROSYN ) 500 MG tablet Take 1 tablet (500 mg total) by mouth 2 (two) times daily with a meal. Patient not taking: Reported on 05/21/2024 11/09/22   Gladis Mustard, FNP  Semaglutide -Weight Management 0.25 MG/0.5ML SOAJ Inject 0.25 mg into the skin. 09/18/23   [provider]    Allergies: Amoxicillin , Other, Oxycodone-acetaminophen , Percocet [oxycodone-acetaminophen ], Ultracet [tramadol-acetaminophen ], Vicodin [hydrocodone-acetaminophen ], Darvon [propoxyphene], and Propoxyphene n-acetaminophen     Review of Systems  Gastrointestinal:  Positive for vomiting.    Updated Vital Signs BP 138/85   Pulse 91   Temp 98.2 F (36.8 C) (Oral)   Resp 13   Ht 5' 6 (1.676 m)  Wt 99.3 kg   SpO2 97%   BMI 35.35 kg/m   Physical Exam Vitals and nursing note reviewed.  Constitutional:      General: She is awake. She is not in acute distress.    Appearance: Normal appearance. She is not ill-appearing, toxic-appearing or diaphoretic.  HENT:     Head: Normocephalic and atraumatic.  Eyes:     General: No scleral icterus. Cardiovascular:     Rate and Rhythm: Normal rate and  regular rhythm.  Pulmonary:     Effort: Pulmonary effort is normal. No respiratory distress.     Breath sounds: No wheezing, rhonchi or rales.  Abdominal:     General: Abdomen is flat. There is no distension.     Tenderness: There is no abdominal tenderness. There is no right CVA tenderness, left CVA tenderness, guarding or rebound.  Musculoskeletal:        General: Normal range of motion.     Right lower leg: No edema.     Left lower leg: No edema.  Skin:    General: Skin is warm.     Capillary Refill: Capillary refill takes less than 2 seconds.  Neurological:     General: No focal deficit present.     Mental Status: She is alert and oriented to person, place, and time.  Psychiatric:        Behavior: Behavior normal. Behavior is cooperative.     (all labs ordered are listed, but only abnormal results are displayed) Labs Reviewed  COMPREHENSIVE METABOLIC PANEL WITH GFR - Abnormal; Notable for the following components:      Result Value   CO2 19 (*)    Glucose, Bld 100 (*)    All other components within normal limits  CBC - Abnormal; Notable for the following components:   RBC 5.12 (*)    Hemoglobin 16.1 (*)    HCT 46.1 (*)    All other components within normal limits  URINALYSIS, ROUTINE W REFLEX MICROSCOPIC - Abnormal; Notable for the following components:   Ketones, ur 20 (*)    Protein, ur 30 (*)    All other components within normal limits  LIPASE, BLOOD    EKG: EKG Interpretation Date/Time:  Sunday May 31 2024 09:01:08 EDT Ventricular Rate:  89 PR Interval:  145 QRS Duration:  101 QT Interval:  378 QTC Calculation: 460 R Axis:   74  Text Interpretation: Sinus rhythm Borderline repolarization abnormality Confirmed by Levander Houston [phone]) on 05/31/2024 9:14:23 AM  Radiology: No results found.   Procedures   Medications Ordered in the ED  ondansetron  (ZOFRAN ) injection 4 mg (4 mg Intravenous Given 05/31/24 0835)  sodium chloride  0.9 % bolus 1,000 mL (0  mLs Intravenous Stopped 05/31/24 1054)                                    Medical Decision Making Amount and/or Complexity of Data Reviewed Labs: ordered.  Risk Prescription drug management.   Patient presents to the ED for concern of nausea, vomiting, this involves an extensive number of treatment options, and is a complaint that carries with it a high risk of complications and morbidity.  The differential diagnosis includes dehydration, medication induced nausea/vomiting, viral syndrome, obstruction, pancreatitis, gastritis, cholecystitis, cholangitis, appendicitis, diverticulitis, ACS, etc.   Co morbidities that complicate the patient evaluation  rheumatoid arthritis, sleep apnea, hypothyroidism, asthma, GERD, anxiety   Lab Tests:  I  Ordered, and personally interpreted labs.  The pertinent results include: CBC unremarkable, CMP shows slightly decreased bicarb at 19, otherwise electrolytes and abdominal labs unremarkable, lipase unremarkable, urinalysis significant for ketones and protein   Medicines ordered and prescription drug management:  I ordered medication including Zofran  and fluids for nausea, vomiting Reevaluation of the patient after these medicines showed that the patient improved I have reviewed the patients home medicines and have made adjustments as needed   Test Considered:  CT abdomen pelvis: declined at this time as patient does have N/V but denies abdominal pain, no pain with palpation, no CVA tenderness, lab work reassurring   Critical Interventions:  none   Problem List / ED Course:  53 year old female, vital signs stable, nausea and vomiting presumably drug-induced due to restarting Wegovy  at higher dose On physical exam no obvious abdominal tenderness with palpation, no CVA tenderness, patient overall well-appearing however uncomfortable Labs show slightly decreased bicarb and ketones in urine suggestive of possible dehydration, will  symptomatically treat with IV antiemetic and fluids and reassess EKG ordered due to patient stating she has muscle spasm chest pain due which she believes is due to her vomiting, low clinical suspicion for ACS or cardiac etiology at this time, EKG showed no STEMI by my interpretation  On reassessment patient clinically improved after zofran  and fluids, now tolerating PO, will continue to observe for 15-20 minutes and as long as patient continues to improve will discharge with instructions for PCP follow-up and to stop taking wegovy  at this time  Most likely diagnosis at this time is drug-induced nausea, vomiting which improved with symptomatic treatment due to patient starting wegovy  at increased dose from previous  Patient remained clinically improved  Return precautions given Patient discharged    Reevaluation:  After the interventions noted above, I reevaluated the patient and found that they have :improved   Social Determinants of Health:  none   Dispostion:  After consideration of the diagnostic results and the patients response to treatment, I feel that the patent would benefit from discharge and follow-up with her PCP regarding use of Wegovy  medication. Recommend discontinuation at this time.      Final diagnoses:  Nausea and vomiting, unspecified vomiting type    ED Discharge Orders     None          Janetta Terrall F[place], [place] 05/31/24 1557    Levander Houston, MD 06/02/24 1316

## 2024-06-02 ENCOUNTER — Telehealth: Payer: Self-pay | Admitting: Family Medicine

## 2024-06-02 NOTE — Telephone Encounter (Signed)
 Patient would like to speak to the clinical manager regarding her visit and medication prescribed.  She is upset that everything she has messaged has gone unanswered.  The medication has made her very sick and she ended up in the ER and again her messages have not been addressed.  Please call her asap.

## 2024-06-04 NOTE — Telephone Encounter (Signed)
 Called patient this morning. She is very upset regarding the lack of communication she has experienced. I apologized and offered her a follow up in office to discuss the medication issue and she declines. She is not interested in being a patient of our office any longer. Patient terminated call.

## 2024-06-04 NOTE — Telephone Encounter (Signed)
 She told me she went to the E.D. Never asked a question, just sent info (that I already had).

## 2024-06-26 ENCOUNTER — Telehealth: Payer: Self-pay

## 2024-06-26 ENCOUNTER — Other Ambulatory Visit (HOSPITAL_COMMUNITY): Payer: Self-pay

## 2024-06-26 NOTE — Telephone Encounter (Signed)
 Pharmacy Patient Advocate Encounter   Received notification from CoverMyMeds that prior authorization for Esomeprazole  Magnesium  40MG  dr capsules is required/requested.   Insurance verification completed.   The patient is insured through [employer] .   Per test claim: The current 90 day co-pay is, $22.50.  No PA needed at this time. This test claim was processed through [facility]- copay amounts may vary at other pharmacies due to pharmacy/plan contracts, or as the patient moves through the different stages of their insurance plan.

## 2024-07-07 ENCOUNTER — Ambulatory Visit: Admitting: Family Medicine

## 2024-07-13 DIAGNOSIS — M069 Rheumatoid arthritis, unspecified: Secondary | ICD-10-CM | POA: Diagnosis not present

## 2024-07-13 DIAGNOSIS — E669 Obesity, unspecified: Secondary | ICD-10-CM | POA: Diagnosis not present

## 2024-07-13 DIAGNOSIS — G4733 Obstructive sleep apnea (adult) (pediatric): Secondary | ICD-10-CM | POA: Diagnosis not present

## 2024-08-10 DIAGNOSIS — Z6834 Body mass index (BMI) 34.0-34.9, adult: Secondary | ICD-10-CM | POA: Diagnosis not present

## 2024-08-10 DIAGNOSIS — R110 Nausea: Secondary | ICD-10-CM | POA: Diagnosis not present

## 2024-08-10 DIAGNOSIS — E669 Obesity, unspecified: Secondary | ICD-10-CM | POA: Diagnosis not present

## 2024-08-25 DIAGNOSIS — G4700 Insomnia, unspecified: Secondary | ICD-10-CM | POA: Diagnosis not present

## 2024-08-25 DIAGNOSIS — E669 Obesity, unspecified: Secondary | ICD-10-CM | POA: Diagnosis not present

## 2024-08-25 DIAGNOSIS — G4733 Obstructive sleep apnea (adult) (pediatric): Secondary | ICD-10-CM | POA: Diagnosis not present

## 2024-08-27 DIAGNOSIS — M818 Other osteoporosis without current pathological fracture: Secondary | ICD-10-CM | POA: Diagnosis not present

## 2024-08-27 DIAGNOSIS — M0579 Rheumatoid arthritis with rheumatoid factor of multiple sites without organ or systems involvement: Secondary | ICD-10-CM | POA: Diagnosis not present

## 2024-08-27 DIAGNOSIS — M1991 Primary osteoarthritis, unspecified site: Secondary | ICD-10-CM | POA: Diagnosis not present

## 2024-08-27 DIAGNOSIS — Z79899 Other long term (current) drug therapy: Secondary | ICD-10-CM | POA: Diagnosis not present

## 2024-10-01 DIAGNOSIS — G4733 Obstructive sleep apnea (adult) (pediatric): Secondary | ICD-10-CM | POA: Diagnosis not present

## 2024-10-12 DIAGNOSIS — U071 COVID-19: Secondary | ICD-10-CM | POA: Diagnosis not present

## 2024-10-12 DIAGNOSIS — R062 Wheezing: Secondary | ICD-10-CM | POA: Diagnosis not present

## 2024-11-17 ENCOUNTER — Other Ambulatory Visit: Payer: Self-pay

## 2024-11-17 DIAGNOSIS — K219 Gastro-esophageal reflux disease without esophagitis: Secondary | ICD-10-CM

## 2024-11-17 MED ORDER — ESOMEPRAZOLE MAGNESIUM 40 MG PO CPDR: 40.0000 mg | DELAYED_RELEASE_CAPSULE | Freq: Two times a day (BID) | ORAL | 0 refills | Status: AC
